# Patient Record
Sex: Male | Born: 1985 | Race: Black or African American | Hispanic: No | Marital: Single | Smoking: Former smoker
Health system: Southern US, Community
[De-identification: ages and names within clinical notes are randomized; demographics above are authoritative.]

## PROBLEM LIST (undated history)

## (undated) DIAGNOSIS — I82409 Acute embolism and thrombosis of unspecified deep veins of unspecified lower extremity: Secondary | ICD-10-CM

## (undated) DIAGNOSIS — E559 Vitamin D deficiency, unspecified: Secondary | ICD-10-CM

## (undated) DIAGNOSIS — E785 Hyperlipidemia, unspecified: Secondary | ICD-10-CM

## (undated) DIAGNOSIS — E039 Hypothyroidism, unspecified: Secondary | ICD-10-CM

## (undated) HISTORY — DX: Vitamin D deficiency, unspecified: E55.9

## (undated) HISTORY — DX: Hyperlipidemia, unspecified: E78.5

## (undated) HISTORY — DX: Acute embolism and thrombosis of unspecified deep veins of unspecified lower extremity: I82.409

## (undated) HISTORY — DX: Hypothyroidism, unspecified: E03.9

---
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
## 2011-11-14 ENCOUNTER — Emergency Department (HOSPITAL_COMMUNITY): Payer: BC Managed Care – PPO

## 2011-11-14 ENCOUNTER — Emergency Department (HOSPITAL_COMMUNITY)
Admission: EM | Admit: 2011-11-14 | Discharge: 2011-11-14 | Disposition: A | Discharge status: Home / Self Care | Payer: BC Managed Care – PPO | Attending: Emergency Medicine | Admitting: Emergency Medicine

## 2011-11-14 DIAGNOSIS — R51 Headache: Secondary | ICD-10-CM | POA: Insufficient documentation

## 2011-11-14 DIAGNOSIS — S0003XA Contusion of scalp, initial encounter: Secondary | ICD-10-CM | POA: Insufficient documentation

## 2011-11-14 DIAGNOSIS — M25569 Pain in unspecified knee: Secondary | ICD-10-CM | POA: Insufficient documentation

## 2011-11-14 DIAGNOSIS — M25519 Pain in unspecified shoulder: Secondary | ICD-10-CM | POA: Insufficient documentation

## 2011-11-14 DIAGNOSIS — S1093XA Contusion of unspecified part of neck, initial encounter: Secondary | ICD-10-CM | POA: Insufficient documentation

## 2011-11-14 DIAGNOSIS — IMO0002 Reserved for concepts with insufficient information to code with codable children: Secondary | ICD-10-CM | POA: Insufficient documentation

## 2011-11-14 DIAGNOSIS — R404 Transient alteration of awareness: Secondary | ICD-10-CM | POA: Insufficient documentation

## 2011-11-14 MED ORDER — IBUPROFEN 600 MG PO TABS: 600.0000 mg | ORAL_TABLET | Freq: Four times a day (QID) | ORAL | Status: AC | PRN

## 2011-11-14 MED ORDER — HYDROCODONE-ACETAMINOPHEN 5-325 MG PO TABS
1.0000 | ORAL_TABLET | Freq: Once | ORAL | Status: AC
  Administered 2011-11-14: 1 via ORAL
  Filled 2011-11-14: qty 1
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
## 2011-11-14 NOTE — ED Notes (Signed)
Patient transported to X-ray 
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
## 2011-11-15 NOTE — ED Provider Notes (Signed)
I saw and evaluated the patient, reviewed the resident's note and I agree with the findings and plan.  Elmo Shumard P Shterna Laramee, MD 11/15/11 1516 

## 2013-01-27 ENCOUNTER — Other Ambulatory Visit: Payer: Self-pay | Admitting: Internal Medicine

## 2013-01-27 DIAGNOSIS — R7989 Other specified abnormal findings of blood chemistry: Secondary | ICD-10-CM

## 2013-01-29 ENCOUNTER — Ambulatory Visit
Admission: RE | Admit: 2013-01-29 | Discharge: 2013-01-29 | Disposition: A | Discharge status: Home / Self Care | Payer: BC Managed Care – PPO | Source: Ambulatory Visit | Attending: Internal Medicine | Admitting: Internal Medicine

## 2013-01-29 DIAGNOSIS — R7989 Other specified abnormal findings of blood chemistry: Secondary | ICD-10-CM

## 2013-03-30 ENCOUNTER — Encounter (HOSPITAL_COMMUNITY): Payer: Self-pay | Admitting: Emergency Medicine

## 2013-03-30 ENCOUNTER — Emergency Department (HOSPITAL_COMMUNITY): Payer: BC Managed Care – PPO

## 2013-03-30 ENCOUNTER — Emergency Department (HOSPITAL_COMMUNITY)
Admission: EM | Admit: 2013-03-30 | Discharge: 2013-03-30 | Disposition: A | Discharge status: Home / Self Care | Payer: BC Managed Care – PPO | Attending: Emergency Medicine | Admitting: Emergency Medicine

## 2013-03-30 DIAGNOSIS — S01501A Unspecified open wound of lip, initial encounter: Secondary | ICD-10-CM | POA: Insufficient documentation

## 2013-03-30 DIAGNOSIS — Y9239 Other specified sports and athletic area as the place of occurrence of the external cause: Secondary | ICD-10-CM | POA: Insufficient documentation

## 2013-03-30 DIAGNOSIS — W219XXA Striking against or struck by unspecified sports equipment, initial encounter: Secondary | ICD-10-CM | POA: Insufficient documentation

## 2013-03-30 DIAGNOSIS — S01511A Laceration without foreign body of lip, initial encounter: Secondary | ICD-10-CM

## 2013-03-30 DIAGNOSIS — Y9367 Activity, basketball: Secondary | ICD-10-CM | POA: Insufficient documentation

## 2013-03-30 DIAGNOSIS — Y92838 Other recreation area as the place of occurrence of the external cause: Secondary | ICD-10-CM | POA: Insufficient documentation

## 2013-03-30 MED ORDER — PROMETHAZINE HCL 25 MG PO TABS
25.0000 mg | ORAL_TABLET | Freq: Four times a day (QID) | ORAL | Status: DC | PRN
Start: 2013-03-30 — End: 2013-12-31

## 2013-03-30 MED ORDER — IBUPROFEN 800 MG PO TABS
800.0000 mg | ORAL_TABLET | Freq: Once | ORAL | Status: AC
  Administered 2013-03-30: 800 mg via ORAL
  Filled 2013-03-30: qty 1

## 2013-03-30 MED ORDER — HYDROCODONE-ACETAMINOPHEN 5-325 MG PO TABS: 2.0000 | ORAL_TABLET | Freq: Four times a day (QID) | ORAL | Status: DC | PRN

## 2013-03-30 NOTE — ED Notes (Signed)
Pt ambulatory to exam room with steady gait. Bleeding controlled.

## 2013-03-30 NOTE — ED Notes (Signed)
Pt was playing basketball today and hit lip, small laceration to rt upper lip, bleeding controlled.

## 2013-03-30 NOTE — ED Provider Notes (Signed)
Medical screening examination/treatment/procedure(s) were performed by non-physician practitioner and as supervising physician I was immediately available for consultation/collaboration.  Toy Baker, MD 03/30/13 2220

## 2013-03-30 NOTE — ED Provider Notes (Signed)
History  This chart was scribed for non-physician practitioner Junious Silk, PA working with Toy Baker, MD by Quintella Reichert, ED Scribe. This patient was seen in room WTR8/WTR8 and the patient's care was started at 3:17 PM .   CSN: [id number]  Arrival date & time 03/30/13  1301    Chief Complaint  Patient presents with  . Lip Laceration    The history is provided by the patient. No language interpreter was used.     HPI Comments: David Tanner is a 27 y.o. male with no chronic medical conditions who presents to the Emergency Department complaining of a lip laceration that he sustained pta, with accompanying jaw pain.  Pt states that he was playing basketball when he accidentally jammed his chin on someone's shoulder and bit his right upper lip.  Bleeding to the laceration is controlled.  He states that his tetanus vaccinations are UTD.  Presently he also complains of constant, moderate pain to the bottom and sides of his jaw that radiates into the left ear.  Pain is described as sharp and is slightly exacerbated by opening his jaw and greatly exacerbated by biting down.  He has not taken pain medications pta.  Pt denies h/o prior dental problems or jaw injuries.  He notes that his last dental appointment was one month ago.     History reviewed. No pertinent past medical history.   History reviewed. No pertinent past surgical history.   No family history on file.   History  Substance Use Topics  . Smoking status: Not on file  . Smokeless tobacco: Not on file  . Alcohol Use: Not on file     Review of Systems  HENT:       Jaw pain  Skin: Positive for wound.  All other systems reviewed and are negative.      Allergies  Review of patient's allergies indicates no known allergies.   Home Medications  No current outpatient prescriptions on file.   There were no vitals taken for this visit.   Physical Exam  Nursing note and vitals  reviewed. Constitutional: He is oriented to person, place, and time. He appears well-developed and well-nourished. No distress.  HENT:  Head: Normocephalic.  Right Ear: External ear normal.  Left Ear: External ear normal.  Nose: Nose normal.  Mouth/Throat: Uvula is midline, oropharynx is clear and moist and mucous membranes are normal.  1-cm laceration on right upper lip without involvement of [place] border No tenderness over TMJ; no popping or abnormal movement identified.  Eyes: Conjunctivae are normal.  Neck: Normal range of motion. No tracheal deviation present.  Cardiovascular: Normal rate, regular rhythm and normal heart sounds.   Pulmonary/Chest: Effort normal and breath sounds normal. No stridor.  Abdominal: Soft. He exhibits no distension. There is no tenderness.  Musculoskeletal: Normal range of motion.  Neurological: He is alert and oriented to person, place, and time.  Skin: Skin is warm and dry. He is not diaphoretic.  Psychiatric: He has a normal mood and affect. His behavior is normal.    ED Course  Procedures (including critical care time)   COORDINATION OF CARE: 3:21 PM: Discussed treatment plan which includes laceration repair, pain medication and imaging.  Pt expressed understanding and agreed to plan.   LACERATION REPAIR PROCEDURE NOTE The patient's identification was confirmed and consent was obtained. This procedure was performed by Junious Silk, PA at 3:29 PM. Site: Right upper lip Sterile procedures observed Anesthetic used (type and amt):  1% lidocaine w/o epinephrine Suture type/size: 4-0 Vicryl Length: 1 cm # of Sutures: 1 Technique: Interrupted Complexity: Simple Tetanus UTD per patient Site anesthetized, irrigated with NS, explored without evidence of foreign body, wound well approximated, site covered with dry, sterile dressing.  Patient tolerated procedure well without complications. Instructions for care discussed verbally and patient  provided with additional written instructions for homecare and f/u.    Dg Orthopantogram  03/30/2013   *RADIOLOGY REPORT*  Clinical Data: Left upper dental pain  ORTHOPANTOGRAM/PANORAMIC  Comparison: None.  Findings: Filling noted in the right front tooth.  No fracture, bony erosion, or discrete.  focal lucencies are seen.  No gross acute tooth abnormalities appreciated on orthopantogram.  IMPRESSION: No acute bony abnormalities identified.   Original Report Authenticated By: Britta Mccreedy, M.D.     1. Lip laceration, initial encounter      MDM  Tdap booster given. Wound cleaning complete with pressure irrigation, bottom of wound visualized, no foreign bodies appreciated. Laceration occurred < 8 hours prior to repair which was well tolerated. Pt has no co morbidities to effect normal wound healing. Discussed suture home care w pt and answered questions. Patient also complaining of severe tooth pain when his tooth touches his other teeth. XR shows no tooth fracture. Pt is hemodynamically stable w no complaints prior to dc. Follow up with PCP.      I personally performed the services described in this documentation, which was scribed in my presence. The recorded information has been reviewed and is accurate.     Mora Bellman, PA-C 03/30/13 2109

## 2013-06-23 ENCOUNTER — Other Ambulatory Visit: Payer: Self-pay | Admitting: Occupational Medicine

## 2013-06-23 ENCOUNTER — Ambulatory Visit: Payer: Self-pay

## 2013-06-23 DIAGNOSIS — R52 Pain, unspecified: Secondary | ICD-10-CM

## 2013-12-30 ENCOUNTER — Encounter: Payer: Self-pay | Admitting: *Deleted

## 2013-12-30 DIAGNOSIS — E785 Hyperlipidemia, unspecified: Secondary | ICD-10-CM | POA: Insufficient documentation

## 2013-12-30 DIAGNOSIS — E559 Vitamin D deficiency, unspecified: Secondary | ICD-10-CM | POA: Insufficient documentation

## 2013-12-30 DIAGNOSIS — E039 Hypothyroidism, unspecified: Secondary | ICD-10-CM | POA: Insufficient documentation

## 2013-12-31 ENCOUNTER — Ambulatory Visit (INDEPENDENT_AMBULATORY_CARE_PROVIDER_SITE_OTHER): Payer: BC Managed Care – PPO | Admitting: Emergency Medicine

## 2013-12-31 ENCOUNTER — Encounter: Payer: Self-pay | Admitting: Emergency Medicine

## 2013-12-31 VITALS — BP 128/80 | HR 64 | Temp 98.2°F | Resp 18 | Ht 71.0 in | Wt 315.0 lb

## 2013-12-31 DIAGNOSIS — Z79899 Other long term (current) drug therapy: Secondary | ICD-10-CM

## 2013-12-31 DIAGNOSIS — E039 Hypothyroidism, unspecified: Secondary | ICD-10-CM

## 2013-12-31 DIAGNOSIS — E669 Obesity, unspecified: Secondary | ICD-10-CM

## 2013-12-31 DIAGNOSIS — E782 Mixed hyperlipidemia: Secondary | ICD-10-CM

## 2013-12-31 DIAGNOSIS — E559 Vitamin D deficiency, unspecified: Secondary | ICD-10-CM

## 2013-12-31 DIAGNOSIS — R7309 Other abnormal glucose: Secondary | ICD-10-CM

## 2013-12-31 DIAGNOSIS — L309 Dermatitis, unspecified: Secondary | ICD-10-CM

## 2013-12-31 LAB — CBC WITH DIFFERENTIAL/PLATELET
BASOS ABS: 0.1 10*3/uL (ref 0.0–0.1)
Basophils Relative: 1 % (ref 0–1)
EOS PCT: 4 % (ref 0–5)
Eosinophils Absolute: 0.2 10*3/uL (ref 0.0–0.7)
HCT: 41.3 % (ref 39.0–52.0)
Hemoglobin: 14.5 g/dL (ref 13.0–17.0)
LYMPHS ABS: 2.5 10*3/uL (ref 0.7–4.0)
LYMPHS PCT: 42 % (ref 12–46)
MCH: 28.3 pg (ref 26.0–34.0)
MCHC: 35.1 g/dL (ref 30.0–36.0)
MCV: 80.5 fL (ref 78.0–100.0)
Monocytes Absolute: 0.5 10*3/uL (ref 0.1–1.0)
Monocytes Relative: 9 % (ref 3–12)
NEUTROS ABS: 2.6 10*3/uL (ref 1.7–7.7)
NEUTROS PCT: 44 % (ref 43–77)
PLATELETS: 300 10*3/uL (ref 150–400)
RBC: 5.13 MIL/uL (ref 4.22–5.81)
RDW: 13.7 % (ref 11.5–15.5)
WBC: 5.9 10*3/uL (ref 4.0–10.5)

## 2013-12-31 LAB — HEMOGLOBIN A1C
HEMOGLOBIN A1C: 5.6 % (ref <5.7)
MEAN PLASMA GLUCOSE: 114 mg/dL (ref <117)

## 2013-12-31 MED ORDER — ATORVASTATIN CALCIUM 20 MG PO TABS: 20.0000 mg | ORAL_TABLET | Freq: Every day | ORAL | Status: DC

## 2013-12-31 MED ORDER — LEVOTHYROXINE SODIUM 75 MCG PO TABS: 75.0000 ug | ORAL_TABLET | Freq: Every day | ORAL | Status: DC

## 2013-12-31 NOTE — Progress Notes (Signed)
   Subjective:    Patient ID: David Tanner, male    DOB: 07/27/1986, 28 y.o.   MRN: [id number]  HPI Comments: 28 yo non-compliant obese male presents for 3 month F/U for Hypothyroid, Cholesterol, Pre-Dm, D. Deficient. LAST Labs T 223 152 L 152 TSH 20 D 26  HE has been off all medicine since October because he ran out and forgot to refill. He has been feeling OK but admits to being lazy. He has been off all RX x several months. He has not been exercising or eating healthy.   He has noticed increased dry skin and patches of roughness over his entire body. He notes they come and go. He denies any known triggers.    No current outpatient prescriptions on file prior to visit.   No current facility-administered medications on file prior to visit.   No Known Allergies Past Medical History  Diagnosis Date  . Hypothyroid   . Vitamin D deficiency   . Hyperlipidemia      Review of Systems  Skin: Positive for rash.  All other systems reviewed and are negative.   BP 128/80  Pulse 64  Temp(Src) 98.2 F (36.8 C) (Temporal)  Resp 18  Ht 5\' 11"  (1.803 m)  Wt 315 lb (142.883 kg)  BMI 43.95 kg/m2     Objective:   Physical Exam  Nursing note and vitals reviewed. Constitutional: He is oriented to person, place, and time. He appears well-developed and well-nourished.  Obese  HENT:  Head: Normocephalic and atraumatic.  Right Ear: External ear normal.  Left Ear: External ear normal.  Nose: Nose normal.  Eyes: Conjunctivae and EOM are normal.  Neck: Normal range of motion. Neck supple. No JVD present. No thyromegaly present.  Cardiovascular: Normal rate, regular rhythm, normal heart sounds and intact distal pulses.   Pulmonary/Chest: Effort normal and breath sounds normal.  Abdominal: Soft. Bowel sounds are normal. He exhibits no distension and no mass. There is no tenderness. There is no rebound and no guarding.  Musculoskeletal: Normal range of motion. He exhibits no edema and no  tenderness.  Lymphadenopathy:    He has no cervical adenopathy.  Neurological: He is alert and oriented to person, place, and time. He has normal reflexes. No cranial nerve deficit. Coordination normal.  Skin: Skin is warm and dry.  Sandpaper type rash on arms and torso  Psychiatric: He has a normal mood and affect. His behavior is normal. Judgment and thought content normal.          Assessment & Plan:  1.  3 month F/U for obesity, Cholesterol, Pre-Dm, D, hypothyroid. Deficient. Needs healthy diet, cardio QD and obtain healthy weight. Check Labs, Check BP if >130/80 call office. Restart RX AD, long discussion about compliance and risks  2. Eczema- Hygiene explained, ADD zyrtec, lotion QD aveeno. If no change can refer to derm.

## 2013-12-31 NOTE — Patient Instructions (Signed)
We want weight loss that will last so you should lose 1-2 pounds a week.  THAT IS IT! Please pick THREE things a month to change. Once it is a habit check off the item. Then pick another three items off the list to become habits.  If you are already doing a habit on the list GREAT!  Cross that item off! o Don't drink your calories. Ie, alcohol, soda, fruit juice, and sweet tea.  o Drink more water. Drink a glass when you feel hungry or before each meal.  o Eat breakfast - Complex carb and protein (likeDannon light and fit yogurt, oatmeal, fruit, eggs, [place] bacon). o Measure your cereal.  Eat no more than one cup a day. (ie [place]) o Eat an apple a day. o Add a vegetable a day. o Try a new vegetable a month. o Use Pam! Stop using oil or butter to cook. o Don't finish your plate or use smaller plates. o Share your dessert. o Eat sugar free Jello for dessert or frozen grapes. o Don't eat 2-3 hours before bed. o Switch to whole wheat bread, pasta, and brown rice. o Make healthier choices when you eat out. No fries! o Pick baked chicken, NOT fried. o Don't forget to SLOW DOWN when you eat. It is not going anywhere.  o Take the stairs. o Park far away in the parking lot o [employer] (or weights) for 10 minutes while watching TV. o Walk at work for 10 minutes during break. o Walk outside 1 time a week with your friend, kids, dog, or significant other. o Start a walking group at church. o Walk the mall as much as you can tolerate.  o Keep a food diary. o Weigh yourself daily. o Walk for 15 minutes 3 days per week. o Cook at home more often and eat out less.  If life happens and you go back to old habits, it is okay.  Just start over. You can do it!   If you experience chest pain, get short of breath, or tired during the exercise, please stop immediately and inform your doctor.  Cholesterol Cholesterol is a type of fat. Your body needs a small amount of cholesterol, but too much can cause  health problems. Certain problems include heart attacks, strokes, and not enough blood flow to your heart, brain, kidneys, or feet. You get cholesterol in 2 ways:  Naturally.  By eating certain foods. HOME CARE  Eat a low-fat diet:  Eat less eggs, whole dairy products (whole milk, cheese, and butter), fatty meats, and fried foods.  Eat more fruits, vegetables, whole-wheat breads, lean chicken, and fish.  Follow your exercise program as told by your doctor.  Keep your weight at a healthy level. Talk to your doctor about what is right for you.  Only take medicine as told by your doctor.  Get your cholesterol checked once a year or as told by your doctor. MAKE SURE YOU:  Understand these instructions.  Will watch your condition.  Will get help right away if you are not doing well or get worse. Document Released: 12/13/2008 Document Revised: 12/09/2011 Document Reviewed: 06/30/2013 [facility] Patient Information 2014 [place], [place]. Eczema Eczema, also called atopic dermatitis, is a skin disorder that causes inflammation of the skin. It causes a red rash and dry, scaly skin. The skin becomes very itchy. Eczema is generally worse during the cooler winter months and often improves with the warmth of summer. Eczema usually starts showing  signs in infancy. Some children outgrow eczema, but it may last through adulthood.  CAUSES  The exact cause of eczema is not known, but it appears to run in families. People with eczema often have a family history of eczema, allergies, asthma, or hay fever. Eczema is not contagious. Flare-ups of the condition may be caused by:   Contact with something you are sensitive or allergic to.   Stress. SIGNS AND SYMPTOMS  Dry, scaly skin.   Red, itchy rash.   Itchiness. This may occur before the skin rash and may be very intense.  DIAGNOSIS  The diagnosis of eczema is usually made based on symptoms and medical history. TREATMENT  Eczema cannot be  cured, but symptoms usually can be controlled with treatment and other strategies. A treatment plan might include:  Controlling the itching and scratching.   Use over-the-counter antihistamines as directed for itching. This is especially useful at night when the itching tends to be worse.   Use over-the-counter steroid creams as directed for itching.   Avoid scratching. Scratching makes the rash and itching worse. It may also result in a skin infection (impetigo) due to a break in the skin caused by scratching.   Keeping the skin well moisturized with creams every day. This will seal in moisture and help prevent dryness. Lotions that contain alcohol and water should be avoided because they can dry the skin.   Limiting exposure to things that you are sensitive or allergic to (allergens).   Recognizing situations that cause stress.   Developing a plan to manage stress.  HOME CARE INSTRUCTIONS   Only take over-the-counter or prescription medicines as directed by your health care provider.   Do not use anything on the skin without checking with your health care provider.   Keep baths or showers short (5 minutes) in warm (not hot) water. Use mild cleansers for bathing. These should be unscented. You may add nonperfumed bath oil to the bath water. It is best to avoid soap and bubble bath.   Immediately after a bath or shower, when the skin is still damp, apply a moisturizing ointment to the entire body. This ointment should be a petroleum ointment. This will seal in moisture and help prevent dryness. The thicker the ointment, the better. These should be unscented.   Keep fingernails cut short. Children with eczema may need to wear soft gloves or mittens at night after applying an ointment.   Dress in clothes made of cotton or cotton blends. Dress lightly, because heat increases itching.   A child with eczema should stay away from anyone with fever blisters or cold sores. The  virus that causes fever blisters (herpes simplex) can cause a serious skin infection in children with eczema. SEEK MEDICAL CARE IF:   Your itching interferes with sleep.   Your rash gets worse or is not better within 1 week after starting treatment.   You see pus or soft yellow scabs in the rash area.   You have a fever.   You have a rash flare-up after contact with someone who has fever blisters.  Document Released: 09/13/2000 Document Revised: 07/07/2013 Document Reviewed: 04/19/2013 [facility] Patient Information 2014 [place], [place]. Obesity Obesity is having too much body fat and a body mass index (BMI) of 30 or more. BMI is a number based on your height and weight. The number is an estimate of how much body fat you have. Obesity can happen if you eat more calories than you can burn  by exercising or other activity. It can cause major health problems or emergencies.  HOME CARE  Exercise and be active as told by your doctor. Try:  Using stairs when you can.  Parking farther away from store doors.  Gardening, biking, or walking.  Eat healthy foods and drinks that are low in calories. Eat more fruits and vegetables.  Limit fast food, sweets, and snack foods that are made with ingredients that are not natural (processed food).  Eat smaller amounts of food.  Keep a journal and write down what you eat every day. Websites can help with this.  Avoid drinking alcohol. Drink more water and drinks without calories.   Take vitamins and dietary pills (supplements) only as told by your doctor.  Try going to weight-loss support groups or classes to help lessen stress. Dieticians and counselors may also help. GET HELP RIGHT AWAY IF:  You have chest pain or tightness.  You have trouble breathing or feel short of breath.  You feel weak or have loss of feeling (numbness) in your legs.  You feel confused or have trouble talking.  You have sudden changes in your vision. MAKE SURE  YOU:  Understand these instructions.  Will watch your condition.  Will get help right away if you are not doing well or get worse. Document Released: 12/09/2011 Document Reviewed: 12/09/2011 [facility] Patient Information 2014 [place], [place]. Hypothyroidism The thyroid is a large gland located in the lower front of your neck. The thyroid gland helps control metabolism. Metabolism is how your body handles food. It controls metabolism with the hormone thyroxine. When this gland is underactive (hypothyroid), it produces too little hormone.  CAUSES These include:   Absence or destruction of thyroid tissue.  Goiter due to iodine deficiency.  Goiter due to medications.  Congenital defects (since birth).  Problems with the pituitary. This causes a lack of TSH (thyroid stimulating hormone). This hormone tells the thyroid to turn out more hormone. SYMPTOMS  Lethargy (feeling as though you have no energy)  Cold intolerance  Weight gain (in spite of normal food intake)  Dry skin  Coarse hair  Menstrual irregularity (if severe, may lead to infertility)  Slowing of thought processes Cardiac problems are also caused by insufficient amounts of thyroid hormone. Hypothyroidism in the newborn is cretinism, and is an extreme form. It is important that this form be treated adequately and immediately or it will lead rapidly to retarded physical and mental development. DIAGNOSIS  To prove hypothyroidism, your caregiver may do blood tests and ultrasound tests. Sometimes the signs are hidden. It may be necessary for your caregiver to watch this illness with blood tests either before or after diagnosis and treatment. TREATMENT  Low levels of thyroid hormone are increased by using synthetic thyroid hormone. This is a safe, effective treatment. It usually takes about four weeks to gain the full effects of the medication. After you have the full effect of the medication, it will generally take another  four weeks for problems to leave. Your caregiver may start you on low doses. If you have had heart problems the dose may be gradually increased. It is generally not an emergency to get rapidly to normal. HOME CARE INSTRUCTIONS   Take your medications as your caregiver suggests. Let your caregiver know of any medications you are taking or start taking. Your caregiver will help you with dosage schedules.  As your condition improves, your dosage needs may increase. It will be necessary to have continuing blood tests  as suggested by your caregiver.  Report all suspected medication side effects to your caregiver. SEEK MEDICAL CARE IF: Seek medical care if you develop:  Sweating.  Tremulousness (tremors).  Anxiety.  Rapid weight loss.  Heat intolerance.  Emotional swings.  Diarrhea.  Weakness. SEEK IMMEDIATE MEDICAL CARE IF:  You develop chest pain, an irregular heart beat (palpitations), or a rapid heart beat. MAKE SURE YOU:   Understand these instructions.  Will watch your condition.  Will get help right away if you are not doing well or get worse. Document Released: 09/16/2005 Document Revised: 12/09/2011 Document Reviewed: 05/06/2008 [facility] Patient Information 2014 [place], [place].

## 2014-01-01 LAB — LIPID PANEL
CHOL/HDL RATIO: 5 ratio
Cholesterol: 207 mg/dL — ABNORMAL HIGH (ref 0–200)
HDL: 41 mg/dL (ref >39)
LDL Cholesterol: 127 mg/dL — ABNORMAL HIGH (ref 0–99)
Triglycerides: 194 mg/dL — ABNORMAL HIGH (ref <150)
VLDL: 39 mg/dL (ref 0–40)

## 2014-01-01 LAB — HEPATIC FUNCTION PANEL
ALK PHOS: 46 U/L (ref 39–117)
ALT: 23 U/L (ref 0–53)
AST: 21 U/L (ref 0–37)
Albumin: 4.3 g/dL (ref 3.5–5.2)
BILIRUBIN DIRECT: 0.1 mg/dL (ref 0.0–0.3)
BILIRUBIN TOTAL: 0.3 mg/dL (ref 0.2–1.2)
Indirect Bilirubin: 0.2 mg/dL (ref 0.2–1.2)
Total Protein: 6.5 g/dL (ref 6.0–8.3)

## 2014-01-01 LAB — MAGNESIUM: MAGNESIUM: 1.8 mg/dL (ref 1.5–2.5)

## 2014-01-01 LAB — BASIC METABOLIC PANEL WITH GFR
BUN: 13 mg/dL (ref 6–23)
CHLORIDE: 104 meq/L (ref 96–112)
CO2: 23 mEq/L (ref 19–32)
Calcium: 9.6 mg/dL (ref 8.4–10.5)
Creat: 1.29 mg/dL (ref 0.50–1.35)
GFR, EST AFRICAN AMERICAN: 88 mL/min
GFR, EST NON AFRICAN AMERICAN: 77 mL/min
Glucose, Bld: 99 mg/dL (ref 70–99)
POTASSIUM: 4.3 meq/L (ref 3.5–5.3)
SODIUM: 139 meq/L (ref 135–145)

## 2014-01-01 LAB — VITAMIN D 25 HYDROXY (VIT D DEFICIENCY, FRACTURES): Vit D, 25-Hydroxy: 17 ng/mL — ABNORMAL LOW (ref 30–89)

## 2014-01-01 LAB — TSH: TSH: 15.281 u[IU]/mL — AB (ref 0.350–4.500)

## 2014-01-01 LAB — INSULIN, FASTING: Insulin fasting, serum: 24 u[IU]/mL (ref 3–28)

## 2014-01-04 ENCOUNTER — Other Ambulatory Visit: Payer: Self-pay | Admitting: Emergency Medicine

## 2014-01-04 ENCOUNTER — Telehealth: Payer: Self-pay

## 2014-01-04 MED ORDER — ATORVASTATIN CALCIUM 20 MG PO TABS: 20.0000 mg | ORAL_TABLET | Freq: Every day | ORAL | Status: DC

## 2014-01-04 NOTE — Telephone Encounter (Signed)
Pt called and can't get Rx for cholesterol @ [facility]. Can you send Rx to [facility]?

## 2014-01-04 NOTE — Telephone Encounter (Signed)
Advised [facility] to [facility]

## 2014-02-03 ENCOUNTER — Other Ambulatory Visit: Payer: BC Managed Care – PPO

## 2014-02-03 DIAGNOSIS — E039 Hypothyroidism, unspecified: Secondary | ICD-10-CM

## 2014-02-04 LAB — TSH: TSH: 5.606 u[IU]/mL — ABNORMAL HIGH (ref 0.350–4.500)

## 2014-02-22 ENCOUNTER — Other Ambulatory Visit: Payer: Self-pay | Admitting: *Deleted

## 2014-02-22 MED ORDER — ATORVASTATIN CALCIUM 20 MG PO TABS: 20.0000 mg | ORAL_TABLET | Freq: Every day | ORAL | Status: DC

## 2014-03-09 ENCOUNTER — Ambulatory Visit (INDEPENDENT_AMBULATORY_CARE_PROVIDER_SITE_OTHER): Payer: BC Managed Care – PPO | Admitting: Physician Assistant

## 2014-03-09 ENCOUNTER — Encounter: Payer: Self-pay | Admitting: Physician Assistant

## 2014-03-09 VITALS — BP 130/82 | HR 68 | Temp 97.9°F | Resp 16 | Ht 71.0 in | Wt 311.0 lb

## 2014-03-09 DIAGNOSIS — E785 Hyperlipidemia, unspecified: Secondary | ICD-10-CM

## 2014-03-09 DIAGNOSIS — B360 Pityriasis versicolor: Secondary | ICD-10-CM

## 2014-03-09 DIAGNOSIS — Z0000 Encounter for general adult medical examination without abnormal findings: Secondary | ICD-10-CM

## 2014-03-09 LAB — URINALYSIS, ROUTINE W REFLEX MICROSCOPIC
Bilirubin Urine: NEGATIVE
Glucose, UA: NEGATIVE mg/dL
HGB URINE DIPSTICK: NEGATIVE
Ketones, ur: NEGATIVE mg/dL
LEUKOCYTES UA: NEGATIVE
NITRITE: NEGATIVE
PROTEIN: NEGATIVE mg/dL
Specific Gravity, Urine: 1.017 (ref 1.005–1.030)
Urobilinogen, UA: 0.2 mg/dL (ref 0.0–1.0)
pH: 6 (ref 5.0–8.0)

## 2014-03-09 LAB — CBC WITH DIFFERENTIAL/PLATELET
BASOS PCT: 1 % (ref 0–1)
Basophils Absolute: 0.1 10*3/uL (ref 0.0–0.1)
EOS ABS: 0.3 10*3/uL (ref 0.0–0.7)
EOS PCT: 5 % (ref 0–5)
HCT: 42.4 % (ref 39.0–52.0)
Hemoglobin: 14.8 g/dL (ref 13.0–17.0)
LYMPHS ABS: 2.1 10*3/uL (ref 0.7–4.0)
Lymphocytes Relative: 39 % (ref 12–46)
MCH: 28.6 pg (ref 26.0–34.0)
MCHC: 34.9 g/dL (ref 30.0–36.0)
MCV: 81.9 fL (ref 78.0–100.0)
MONOS PCT: 10 % (ref 3–12)
Monocytes Absolute: 0.5 10*3/uL (ref 0.1–1.0)
Neutro Abs: 2.4 10*3/uL (ref 1.7–7.7)
Neutrophils Relative %: 45 % (ref 43–77)
PLATELETS: 287 10*3/uL (ref 150–400)
RBC: 5.18 MIL/uL (ref 4.22–5.81)
RDW: 14.4 % (ref 11.5–15.5)
WBC: 5.3 10*3/uL (ref 4.0–10.5)

## 2014-03-09 LAB — HEMOGLOBIN A1C
HEMOGLOBIN A1C: 5.8 % — AB (ref <5.7)
MEAN PLASMA GLUCOSE: 120 mg/dL — AB (ref <117)

## 2014-03-09 MED ORDER — CICLOPIROX 1 % EX SHAM: MEDICATED_SHAMPOO | CUTANEOUS | Status: DC

## 2014-03-09 NOTE — Progress Notes (Signed)
Complete Physical  Assessment and Plan: Hypothyroid-Hypothyroidism-check TSH level, continue medications the same.   Vitamin D deficiency-check level  Hyperlipidemia--continue medications, check lipids, decrease fatty foods, increase activity.   Obesity BMI over 40-Obesity with co morbidities- long discussion about weight loss, diet, and exercise Tinea Versicolor- shampoo given, if not better will send back to Dr. Jorja Loa.  Health Maintenance   Discussed med's effects and SE's. Screening labs and tests as requested with regular follow-up as recommended.  HPI 28 y.o. male  presents for a complete physical. His blood pressure has been controlled at home, today their BP is BP: 130/82 mmHg He does not workout but works hard at work at [employer]. He denies chest pain, shortness of breath, dizziness.  He is on cholesterol medication and denies myalgias. His cholesterol is at goal. The cholesterol last visit was:   Lab Results  Component Value Date   CHOL 207* 12/31/2013   HDL 41 12/31/2013   LDLCALC 588* 12/31/2013   TRIG 194* 12/31/2013   CHOLHDL 5.0 12/31/2013   He has been working on diet and exercise for prediabetes, and denies paresthesia of the feet, polydipsia and polyuria. Last A1C in the office was:  Lab Results  Component Value Date   HGBA1C 5.6 12/31/2013   Patient is on Vitamin D supplement. He is on thyroid medication. His medication was changed last visit, he is on 1.5 T/T and 1 pill the rest of the day. Patient denies nervousness, palpitations and weight changes.  Lab Results  Component Value Date   TSH 5.606* 02/03/2014  .  Wt Readings from Last 3 Encounters:  03/09/14 311 lb (141.069 kg)  12/31/13 315 lb (142.883 kg)   He complains of a rash for the past 3-4 years worse with sun, sweating, and the summer. It did get better but now has spread. It is irritated with sweating.   Current Medications:  Current Outpatient Prescriptions on File Prior to Visit  Medication Sig Dispense  Refill  . atorvastatin (LIPITOR) 20 MG tablet Take 1 tablet (20 mg total) by mouth daily.  90 tablet  0  . levothyroxine (SYNTHROID) 75 MCG tablet Take 1 tablet (75 mcg total) by mouth daily.  30 tablet  3   No current facility-administered medications on file prior to visit.   Health Maintenance:  Tetanus: 2007 Pneumovax:N/A Flu vaccine: N/A Zostavax: N/A DEXA: N/A Colonoscopy: N/A EGD: N/A  Allergies: No Known Allergies Medical History:  Past Medical History  Diagnosis Date  . Hypothyroid   . Vitamin D deficiency   . Hyperlipidemia    Surgical History: No past surgical history on file. Family History: No family history on file. Social History:   History  Substance Use Topics  . Smoking status: Current Some Day Smoker  . Smokeless tobacco: Not on file  . Alcohol Use: Not on file   ROS:  [X]  = complains of  [ ]  = denies  General: Fatigue [ ]  Fever [ ]  Chills [ ]  Weakness [ ]   Insomnia [ ]  Weight change [ ]  Night sweats [ ]   Change in appetite [ ]  Eyes: Redness [ ]  Blurred vision [ ]  Diplopia [ ]  Discharge [ ]   ENT: Congestion [ ]  Sinus Pain [ ]  Post Nasal Drip [ ]  Sore Throat [ ]  Earache [ ]  hearing loss [ ]  Tinnitus [ ]  Snoring [ ]   Cardiac: Chest pain/pressure [ ]  SOB [ ]  Orthopnea [ ]   Palpitations [ ]   Paroxysmal nocturnal dyspnea[ ]  Claudication [ ]   Edema [ ]   Pulmonary: Cough [ ]  Wheezing[ ]   SOB [ ]   Pleurisy [ ]   GI: Nausea [ ]  Vomiting[ ]  Dysphagia[ ]  Heartburn[ ]  Abdominal pain [ ]  Constipation [ ] ; Diarrhea [ ]  BRBPR [ ]  Melena[ ]  Bloating [ ]  Hemorrhoids [ ]   GU: Hematuria[ ]  Dysuria [ ]  Nocturia[ ]  Urgency [ ]   Hesitancy [ ]  Discharge [ ]  Frequency [ ]   Neuro: Headaches[ ]  Vertigo[ ]  Paresthesias[ ]  Spasm [ ]  Speech changes [ ]  Incoordination [ ]   Ortho: Arthritis [ ]  Joint pain right hip pain Arly.Keller[X ] Muscle pain [ ]  Joint swelling [ ]  Back Pain [ ]  Skin:  Rash [x ]  Pruritis [x ] Change in skin lesion [ ]   Psych: Depression[ ]  Anxiety[ ]  Confusion [ ]  Memory  loss [ ]   Heme/Lypmh: Bleeding [ ]  Bruising [ ]  Enlarged lymph nodes [ ]   Endocrine: Visual blurring [ ]  Paresthesia [ ]  Polyuria [ ]  Polydypsea [ ]    Heat/cold intolerance [ ]  Hypoglycemia [ ]   Physical Exam: Estimated body mass index is 43.4 kg/(m^2) as calculated from the following:   Height as of this encounter: 5\' 11"  (1.803 m).   Weight as of this encounter: 311 lb (141.069 kg). BP 130/82  Pulse 68  Temp(Src) 97.9 F (36.6 C)  Resp 16  Ht 5\' 11"  (1.803 m)  Wt 311 lb (141.069 kg)  BMI 43.40 kg/m2 General Appearance: Well nourished, in no apparent distress. Eyes: PERRLA, EOMs, conjunctiva no swelling or erythema, normal fundi and vessels. Sinuses: No Frontal/maxillary tenderness ENT/Mouth: Ext aud canals clear, normal light reflex with TMs without erythema, bulging. Good dentition. No erythema, swelling, or exudate on post pharynx. Tonsils not swollen or erythematous. Hearing normal.  Neck: Supple, thyroid normal. No bruits Respiratory: Respiratory effort normal, BS equal bilaterally without rales, rhonchi, wheezing or stridor. Cardio: RRR without murmurs, rubs or gallops. Brisk peripheral pulses without edema.  Chest: symmetric, with normal excursions and percussion. Abdomen: Soft, +BS. Non tender, no guarding, rebound, hernias, masses, or organomegaly. .  Lymphatics: Non tender without lymphadenopathy.  Genitourinary: defer Musculoskeletal: Full ROM all peripheral extremities,5/5 strength, and normal gait. Skin: Warm, dry without rashes, lesions, ecchymosis. Velvety stuck on non erythematous patches/plaques lighter in color than skin along bilateral arms, trunk, and neck.  Neuro: Cranial nerves intact, reflexes equal bilaterally. Normal muscle tone, no cerebellar symptoms. Sensation intact.  Psych: Awake and oriented X 3, normal affect, Insight and Judgment appropriate.   Quentin Mullingollier, Artavis Cowie 10:24 AM

## 2014-03-09 NOTE — Patient Instructions (Addendum)
Tinea Versicolor Tinea versicolor is a common yeast infection of the skin. This condition becomes known when the yeast on our skin starts to overgrow (yeast is a normal inhabitant on our skin). This condition is noticed as white or light brown patches on brown skin, and is more evident in the summer on tanned skin. These areas are slightly scaly if scratched. The light patches from the yeast become evident when the yeast creates "holes in your suntan". This is most often noticed in the summer. The patches are usually located on the chest, back, pubis, neck and body folds. However, it may occur on any area of body. Mild itching and inflammation (redness or soreness) may be present. DIAGNOSIS  The diagnosisof this is made clinically (by looking). Cultures from samples are usually not needed. Examination under the microscope may help. However, yeast is normally found on skin. The diagnosis still remains clinical. Examination under Wood's Ultraviolet Light can determine the extent of the infection. TREATMENT  This common infection is usually only of cosmetic (only a concern to your appearance). It is easily treated with dandruff shampoo used during showers or bathing. Vigorous scrubbing will eliminate the yeast over several days time. The light areas in your skin may remain for weeks or months after the infection is cured unless your skin is exposed to sunlight. The lighter or darker spots caused by the fungus that remain after complete treatment are not a sign of treatment failure; it will take a long time to resolve. Your caregiver may recommend a number of commercial preparations or medication by mouth if home care is not working. Recurrence is common and preventative medication may be necessary. This skin condition is not highly contagious. Special care is not needed to protect close friends and family members. Normal hygiene is usually enough. Follow up is required only if you develop complications (such as a  secondary infection from scratching), if recommended by your caregiver, or if no relief is obtained from the preparations used. Document Released: 09/13/2000 Document Revised: 12/09/2011 Document Reviewed: 10/26/2008 [facility] Patient Information 2014 [employer], [place]. Ciclopirox shampoo What is this medicine? CICLOPIROX SHAMPOO (sye kloe PEER ox) is an antifungal medicine. It used to treat seborrhea, a dandruff-like condition of the scalp and hair. This medicine may be used for other purposes; ask your health care provider or pharmacist if you have questions. COMMON BRAND NAME(S): Loprox What should I tell my health care provider before I take this medicine? They need to know if you have any of these conditions: -diabetes mellitus -herpes infection -HIV infection -constant fungal infections of the skin -an unusual or allergic reaction to ciclopirox, other medicines, foods, dyes, or preservatives -pregnant or trying to get pregnant -breast-feeding How should I use this medicine? This medicine is for use on the hair and scalp only. Follow the directions on the prescription label. Wet hair and apply the amount prescribed to the scalp. Lather and leave on the hair and scalp for 3 minutes, then rinse. You will repeat the treatment twice per week (with 3 days between treatments) for 4 weeks. Use the shampoo for the full treatment time even though your symptoms may have improved. Do not get this shampoo in your eyes. If you do, rinse out with plenty of cool tap water.Do not use your medicine more often than directed. Do not use this medicine for any condition other than the one for which it was prescribed. Talk to your pediatrician regarding the use of this medicine in children. While  this drug may be prescribed for children as young as 69 years of age for selected conditions, precautions do apply. Overdosage: If you think you have taken too much of this medicine contact a poison control center or  emergency room at once. NOTE: This medicine is only for you. Do not share this medicine with others. What if I miss a dose? If you miss a dose, use it as soon as you can. If it is almost time for your next dose, use only that dose. Do not use double or extra doses. What may interact with this medicine? Interactions are not expected. Do not use any other skin products without telling your doctor or health care professional. This list may not describe all possible interactions. Give your health care provider a list of all the medicines, herbs, non-prescription drugs, or dietary supplements you use. Also tell them if you smoke, drink alcohol, or use illegal drugs. Some items may interact with your medicine. What should I watch for while using this medicine? Tell your doctor or health care professional if your symptoms do not improve within 4 weeks, or if they get worse. It is important not to use more shampoo than prescribed. If you have a reaction to the shampoo that suggests you are sensitive to it, discontinue use and contact your doctor or health care professional. What side effects may I notice from receiving this medicine? Side effects that you should report to your doctor or health care professional as soon as possible: -allergic reactions like skin rash, itching or hives, swelling of the face, lips, or tongue -redness, severe burning, blistering, or oozing of the skin Side effects that usually do not require medical attention (report to your doctor or health care professional if they continue or are bothersome): -mild itching This list may not describe all possible side effects. Call your doctor for medical advice about side effects. You may report side effects to FDA at 1-800-FDA-1088. Where should I keep my medicine? Keep out of the reach of children. Store at room temperature between 15 and 30 degrees C (59 and 86 degrees F). Do not freeze. Once opened, the shampoo may be used for up to 8  weeks. Throw away any unused medicine after the expiration date. NOTE: This sheet is a summary. It may not cover all possible information. If you have questions about this medicine, talk to your doctor, pharmacist, or health care provider.  2014, [employer]vier/Gold Standard. (2013-06-11 17:07:05)  Hypothyroidism The thyroid is a large gland located in the lower front of your neck. The thyroid gland helps control metabolism. Metabolism is how your body handles food. It controls metabolism with the hormone thyroxine. When this gland is underactive (hypothyroid), it produces too little hormone.  CAUSES These include:   Absence or destruction of thyroid tissue.  Goiter due to iodine deficiency.  Goiter due to medications.  Congenital defects (since birth).  Problems with the pituitary. This causes a lack of TSH (thyroid stimulating hormone). This hormone tells the thyroid to turn out more hormone. SYMPTOMS  Lethargy (feeling as though you have no energy)  Cold intolerance  Weight gain (in spite of normal food intake)  Dry skin  Coarse hair  Menstrual irregularity (if severe, may lead to infertility)  Slowing of thought processes Cardiac problems are also caused by insufficient amounts of thyroid hormone. Hypothyroidism in the newborn is cretinism, and is an extreme form. It is important that this form be treated adequately and immediately or it will lead  rapidly to retarded physical and mental development. DIAGNOSIS  To prove hypothyroidism, your caregiver may do blood tests and ultrasound tests. Sometimes the signs are hidden. It may be necessary for your caregiver to watch this illness with blood tests either before or after diagnosis and treatment. TREATMENT  Low levels of thyroid hormone are increased by using synthetic thyroid hormone. This is a safe, effective treatment. It usually takes about four weeks to gain the full effects of the medication. After you have the full effect of  the medication, it will generally take another four weeks for problems to leave. Your caregiver may start you on low doses. If you have had heart problems the dose may be gradually increased. It is generally not an emergency to get rapidly to normal. HOME CARE INSTRUCTIONS   Take your medications as your caregiver suggests. Let your caregiver know of any medications you are taking or start taking. Your caregiver will help you with dosage schedules.  As your condition improves, your dosage needs may increase. It will be necessary to have continuing blood tests as suggested by your caregiver.  Report all suspected medication side effects to your caregiver. SEEK MEDICAL CARE IF: Seek medical care if you develop:  Sweating.  Tremulousness (tremors).  Anxiety.  Rapid weight loss.  Heat intolerance.  Emotional swings.  Diarrhea.  Weakness. SEEK IMMEDIATE MEDICAL CARE IF:  You develop chest pain, an irregular heart beat (palpitations), or a rapid heart beat. MAKE SURE YOU:   Understand these instructions.  Will watch your condition.  Will get help right away if you are not doing well or get worse. Document Released: 09/16/2005 Document Revised: 12/09/2011 Document Reviewed: 05/06/2008 [facility] Patient Information 2014 [place], [place].

## 2014-03-10 LAB — INSULIN, FASTING: INSULIN FASTING, SERUM: 42 u[IU]/mL — AB (ref 3–28)

## 2014-03-10 LAB — TSH: TSH: 5.246 u[IU]/mL — ABNORMAL HIGH (ref 0.350–4.500)

## 2014-03-10 LAB — BASIC METABOLIC PANEL WITH GFR
BUN: 10 mg/dL (ref 6–23)
CALCIUM: 9.2 mg/dL (ref 8.4–10.5)
CO2: 23 meq/L (ref 19–32)
CREATININE: 1.22 mg/dL (ref 0.50–1.35)
Chloride: 105 mEq/L (ref 96–112)
GFR, Est African American: >89 mL/min
GFR, Est Non African American: 81 mL/min
GLUCOSE: 90 mg/dL (ref 70–99)
Potassium: 4.4 mEq/L (ref 3.5–5.3)
Sodium: 140 mEq/L (ref 135–145)

## 2014-03-10 LAB — HEPATIC FUNCTION PANEL
ALBUMIN: 4.3 g/dL (ref 3.5–5.2)
ALT: 30 U/L (ref 0–53)
AST: 26 U/L (ref 0–37)
Alkaline Phosphatase: 48 U/L (ref 39–117)
Bilirubin, Direct: 0.1 mg/dL (ref 0.0–0.3)
Indirect Bilirubin: 0.3 mg/dL (ref 0.2–1.2)
Total Bilirubin: 0.4 mg/dL (ref 0.2–1.2)
Total Protein: 6.4 g/dL (ref 6.0–8.3)

## 2014-03-10 LAB — IRON AND TIBC
%SAT: 23 % (ref 20–55)
Iron: 70 ug/dL (ref 42–165)
TIBC: 310 ug/dL (ref 215–435)
UIBC: 240 ug/dL (ref 125–400)

## 2014-03-10 LAB — LIPID PANEL
CHOLESTEROL: 182 mg/dL (ref 0–200)
HDL: 41 mg/dL (ref >39)
LDL Cholesterol: 114 mg/dL — ABNORMAL HIGH (ref 0–99)
Total CHOL/HDL Ratio: 4.4 Ratio
Triglycerides: 136 mg/dL (ref <150)
VLDL: 27 mg/dL (ref 0–40)

## 2014-03-10 LAB — MAGNESIUM: Magnesium: 1.8 mg/dL (ref 1.5–2.5)

## 2014-03-10 LAB — TESTOSTERONE: TESTOSTERONE: 208 ng/dL — AB (ref 300–890)

## 2014-03-10 LAB — VITAMIN B12: VITAMIN B 12: 251 pg/mL (ref 211–911)

## 2014-03-10 LAB — VITAMIN D 25 HYDROXY (VIT D DEFICIENCY, FRACTURES): VIT D 25 HYDROXY: 23 ng/mL — AB (ref 30–89)

## 2014-03-10 LAB — MICROALBUMIN / CREATININE URINE RATIO
Creatinine, Urine: 261.9 mg/dL
Microalb Creat Ratio: 4.5 mg/g (ref 0.0–30.0)
Microalb, Ur: 1.19 mg/dL (ref 0.00–1.89)

## 2014-04-13 ENCOUNTER — Other Ambulatory Visit: Payer: Self-pay | Admitting: *Deleted

## 2014-04-13 MED ORDER — KETOCONAZOLE 2 % EX SHAM: 1.0000 "application " | MEDICATED_SHAMPOO | CUTANEOUS | Status: DC

## 2014-05-19 ENCOUNTER — Ambulatory Visit: Payer: Self-pay | Admitting: Physician Assistant

## 2015-03-13 ENCOUNTER — Ambulatory Visit (INDEPENDENT_AMBULATORY_CARE_PROVIDER_SITE_OTHER): Payer: BLUE CROSS/BLUE SHIELD | Admitting: Physician Assistant

## 2015-03-13 ENCOUNTER — Encounter: Payer: Self-pay | Admitting: Physician Assistant

## 2015-03-13 VITALS — BP 138/90 | HR 64 | Temp 98.4°F | Resp 18 | Ht 71.5 in | Wt 324.0 lb

## 2015-03-13 DIAGNOSIS — B360 Pityriasis versicolor: Secondary | ICD-10-CM

## 2015-03-13 DIAGNOSIS — M25552 Pain in left hip: Secondary | ICD-10-CM

## 2015-03-13 DIAGNOSIS — E785 Hyperlipidemia, unspecified: Secondary | ICD-10-CM

## 2015-03-13 DIAGNOSIS — R7309 Other abnormal glucose: Secondary | ICD-10-CM

## 2015-03-13 DIAGNOSIS — Z9119 Patient's noncompliance with other medical treatment and regimen: Secondary | ICD-10-CM | POA: Insufficient documentation

## 2015-03-13 DIAGNOSIS — Z91199 Patient's noncompliance with other medical treatment and regimen due to unspecified reason: Secondary | ICD-10-CM | POA: Insufficient documentation

## 2015-03-13 DIAGNOSIS — I10 Essential (primary) hypertension: Secondary | ICD-10-CM

## 2015-03-13 DIAGNOSIS — E039 Hypothyroidism, unspecified: Secondary | ICD-10-CM

## 2015-03-13 DIAGNOSIS — Z0000 Encounter for general adult medical examination without abnormal findings: Secondary | ICD-10-CM

## 2015-03-13 DIAGNOSIS — Z711 Person with feared health complaint in whom no diagnosis is made: Secondary | ICD-10-CM

## 2015-03-13 DIAGNOSIS — Z79899 Other long term (current) drug therapy: Secondary | ICD-10-CM | POA: Insufficient documentation

## 2015-03-13 DIAGNOSIS — E559 Vitamin D deficiency, unspecified: Secondary | ICD-10-CM

## 2015-03-13 DIAGNOSIS — Z0001 Encounter for general adult medical examination with abnormal findings: Secondary | ICD-10-CM

## 2015-03-13 DIAGNOSIS — R7303 Prediabetes: Secondary | ICD-10-CM

## 2015-03-13 DIAGNOSIS — R6889 Other general symptoms and signs: Secondary | ICD-10-CM

## 2015-03-13 LAB — CBC WITH DIFFERENTIAL/PLATELET
BASOS ABS: 0.1 10*3/uL (ref 0.0–0.1)
BASOS PCT: 1 % (ref 0–1)
Eosinophils Absolute: 0.1 10*3/uL (ref 0.0–0.7)
Eosinophils Relative: 2 % (ref 0–5)
HCT: 45.4 % (ref 39.0–52.0)
Hemoglobin: 15.5 g/dL (ref 13.0–17.0)
Lymphocytes Relative: 45 % (ref 12–46)
Lymphs Abs: 2.7 10*3/uL (ref 0.7–4.0)
MCH: 29.5 pg (ref 26.0–34.0)
MCHC: 34.1 g/dL (ref 30.0–36.0)
MCV: 86.5 fL (ref 78.0–100.0)
MPV: 9.8 fL (ref 8.6–12.4)
Monocytes Absolute: 0.5 10*3/uL (ref 0.1–1.0)
Monocytes Relative: 9 % (ref 3–12)
NEUTROS ABS: 2.6 10*3/uL (ref 1.7–7.7)
NEUTROS PCT: 43 % (ref 43–77)
Platelets: 302 10*3/uL (ref 150–400)
RBC: 5.25 MIL/uL (ref 4.22–5.81)
RDW: 14.4 % (ref 11.5–15.5)
WBC: 6 10*3/uL (ref 4.0–10.5)

## 2015-03-13 LAB — HEMOGLOBIN A1C
Hgb A1c MFr Bld: 5.7 % — ABNORMAL HIGH (ref <5.7)
Mean Plasma Glucose: 117 mg/dL — ABNORMAL HIGH (ref <117)

## 2015-03-13 MED ORDER — KETOCONAZOLE 2 % EX SHAM: 1.0000 "application " | MEDICATED_SHAMPOO | CUTANEOUS | Status: DC

## 2015-03-13 MED ORDER — ATORVASTATIN CALCIUM 20 MG PO TABS: 20.0000 mg | ORAL_TABLET | Freq: Every day | ORAL | Status: DC

## 2015-03-13 MED ORDER — MELOXICAM 15 MG PO TABS: ORAL_TABLET | ORAL | Status: DC

## 2015-03-13 NOTE — Progress Notes (Signed)
Complete Physical  Assessment and Plan: 1. Prediabetes Discussed general issues about diabetes pathophysiology and management., Educational material distributed., Suggested low cholesterol diet., Encouraged aerobic exercise., Discussed foot care., Reminded to get yearly retinal exam. - Hemoglobin A1c - Insulin, fasting  2. Morbid obesity Obesity with co morbidities- long discussion about weight loss, diet, and exercise - Testosterone  3. Hyperlipidemia -continue medications, check lipids, decrease fatty foods, increase activity.  - atorvastatin (LIPITOR) 20 MG tablet; Take 1 tablet (20 mg total) by mouth daily.  Dispense: 90 tablet; Refill: 0 - Lipid panel  4. Hypothyroidism, unspecified hypothyroidism type Hypothyroidism-check TSH level, will put in refill of medication after results, reminded to take on an empty stomach 30-45mins before food.  - TSH  5. Essential hypertension - CBC with Differential/Platelet - BASIC METABOLIC PANEL WITH GFR - Hepatic function panel - Urinalysis, Routine w reflex microscopic (not at [facility]) - Microalbumin / creatinine urine ratio  6. Vitamin D deficiency - Vit D  25 hydroxy (rtn osteoporosis monitoring)  7. Medication management - Magnesium  8. Routine general medical examination at a health care facility Discussed STD testing, safe sex, alcohol and drug awareness, drinking and driving dangers, wearing a seat belt and general safety measures for young adult.  9. Noncompliance Noncompliance- emphasized compliance with medications and appointments, patient expressed understanding of risks.   10. Left hip pain RICE, NSAIDS, exercises given, if not better  PT referral or ortho referral.  - Mobic  send to [facility] HIP UNILAT WITH PELVIS 2-3 VIEWS LEFT; Future  11. Concern about STD in male without diagnosis - GC/chlamydia probe amp, urine - RPR - HIV antibody - HSV(herpes simplex vrs) 1+2 ab-IgG  12. Tinea versicolor - ketoconazole  (NIZORAL) 2 % shampoo; Apply 1 application topically 2 (two) times a week. Apply first bottle 2 times a week and apply the refill bottle 4 times a week.  Lather and rinse after 5-10 minutes.  Dispense: 120 mL; Refill: 1   Discussed med's effects and SE's. Screening labs and tests as requested with regular follow-up as recommended.  HPI 29 y.o. male  presents for a complete physical. He has a history of noncompliance for appointments in the past. He has moved and states in the move, he is out of his medications and has not been taking them.  His blood pressure has been controlled at home, today their BP is BP: 138/90 mmHg He does workout but works hard at work at [employer]. He denies chest pain, shortness of breath, dizziness.  He is on cholesterol medication, he is out of his medications and denies myalgias. His cholesterol is at goal. The cholesterol last visit was:   Lab Results  Component Value Date   CHOL 182 03/09/2014   HDL 41 03/09/2014   LDLCALC 114* 03/09/2014   TRIG 136 03/09/2014   CHOLHDL 4.4 03/09/2014   He has been working on diet and exercise for prediabetes, and denies paresthesia of the feet, polydipsia and polyuria. Last A1C in the office was:  Lab Results  Component Value Date   HGBA1C 5.8* 03/09/2014   Patient is on Vitamin D supplement. Lab Results  Component Value Date   VD25OH 23* 03/09/2014   He is on thyroid medication. He is on 1.5 T/T and 1 pill the rest of the days, however he has been out of it for over 6 months. Patient denies nervousness, palpitations and weight changes.  Lab Results  Component Value Date   TSH 5.246* 03/09/2014  Left hip pain with movement/running/basketball. No catching, clicking, giving out. Occ in the morning. At his left groin.  He has been under stress, he is on probation, going to court tomorrow. Has 2 little girls.   BMI is Body mass index is 44.56 kg/(m^2)., he is working on diet and exercise. Wt Readings from Last 3  Encounters:  03/13/15 324 lb (146.965 kg)  03/09/14 311 lb (141.069 kg)  12/31/13 315 lb (142.883 kg)     Current Medications:  Current Outpatient Prescriptions on File Prior to Visit  Medication Sig Dispense Refill  . atorvastatin (LIPITOR) 20 MG tablet Take 1 tablet (20 mg total) by mouth daily. (Patient not taking: Reported on 03/13/2015) 90 tablet 0  . ketoconazole (NIZORAL) 2 % shampoo Apply 1 application topically 2 (two) times a week. Apply first bottle 2 times a week and apply the refill bottle 4 times a week.  Lather and rinse after 5-10 minutes. (Patient not taking: Reported on 03/13/2015) 120 mL 1   No current facility-administered medications on file prior to visit.   Health Maintenance:  Tetanus: 2007 Pneumovax:N/A Prevnar 13: N/A Flu vaccine: N/A Zostavax: N/A DEXA: N/A Colonoscopy: N/A EGD: N/A  He is sexually active, no symptoms, will get STD testing.  Wears seat belt  Allergies: No Known Allergies Medical History:  Past Medical History  Diagnosis Date  . Hypothyroid   . Vitamin D deficiency   . Hyperlipidemia    Surgical History: No past surgical history on file. Family History: No family history on file. Social History:   History  Substance Use Topics  . Smoking status: Former Smoker -- 0.25 packs/day for 1 years    Quit date: 03/09/2013  . Smokeless tobacco: Never Used  . Alcohol Use: No   Review of Systems  Constitutional: Negative.   HENT: Negative.   Eyes: Negative.   Respiratory: Negative.   Cardiovascular: Negative.   Gastrointestinal: Negative.   Genitourinary: Negative.   Musculoskeletal: Positive for joint pain (left hip pain, worse with exertion). Negative for myalgias, back pain, falls and neck pain.  Skin: Negative.   Neurological: Negative.   Endo/Heme/Allergies: Negative.   Psychiatric/Behavioral: Negative.     Physical Exam: Estimated body mass index is 44.56 kg/(m^2) as calculated from the following:   Height as of this  encounter: 5' 11.5" (1.816 m).   Weight as of this encounter: 324 lb (146.965 kg). BP 138/90 mmHg  Pulse 64  Temp(Src) 98.4 F (36.9 C) (Temporal)  Resp 18  Ht 5' 11.5" (1.816 m)  Wt 324 lb (146.965 kg)  BMI 44.56 kg/m2 General Appearance: Well nourished, in no apparent distress. Eyes: PERRLA, EOMs, conjunctiva no swelling or erythema, normal fundi and vessels. Sinuses: No Frontal/maxillary tenderness ENT/Mouth: Ext aud canals clear, normal light reflex with TMs without erythema, bulging. Good dentition. No erythema, swelling, or exudate on post pharynx. Tonsils not swollen or erythematous. Hearing normal.  Neck: Supple, thyroid normal. No bruits Respiratory: Respiratory effort normal, BS equal bilaterally without rales, rhonchi, wheezing or stridor. Cardio: RRR with occ PVCs without murmurs, rubs or gallops. Brisk peripheral pulses without edema.  Chest: symmetric, with normal excursions and percussion. Abdomen: Soft, +BS, obese, Non tender, no guarding, rebound, hernias, masses, or organomegaly. .  Lymphatics: Non tender without lymphadenopathy.  Genitourinary: defer Musculoskeletal: Full ROM all peripheral extremities,5/5 strength, and normal gait. Patient is able to ambulate well.  Gait is not  antalgic.Left hip: positives: pain with rotation and negatives: FROM no pain with heel impact pulses  full without SI pain.Good distal sensations and pulses bilaterally. Skin: Warm, dry without rashes, lesions, ecchymosis. Velvety stuck on non erythematous patches/plaques lighter in color than skin along bilateral arms, trunk, and neck.  Neuro: Cranial nerves intact, reflexes equal bilaterally. Normal muscle tone, no cerebellar symptoms. Sensation intact.  Psych: Awake and oriented X 3, normal affect, Insight and Judgment appropriate.   Quentin Mulling 10:11 AM

## 2015-03-13 NOTE — Patient Instructions (Addendum)
Bad carbs also include fruit juice, alcohol, and sweet tea. These are empty calories that do not signal to your brain that you are full. You can drink diet drinks, crystal light, ICE zero calorie drinks.   Please remember the good carbs are still carbs which convert into sugar. So please measure them out no more than 1/2-1 cup of rice, oatmeal, pasta, and beans  Veggies are however free foods! Pile them on.   Not all fruit is created equal. Please see the list below, the fruit at the bottom is higher in sugars than the fruit at the top. Please avoid all dried fruits.    Before you even begin to attack a weight-loss plan, it pays to remember this: You are not fat. You have fat. Losing weight isn't about blame or shame; it's simply another achievement to accomplish. Dieting is like any other skill-you have to buckle down and work at it. As long as you act in a smart, reasonable way, you'll ultimately get where you want to be. Here are some weight loss pearls for you.  1. It's Not a Diet. It's a Lifestyle Thinking of a diet as something you're on and suffering through only for the short term doesn't work. To shed weight and keep it off, you need to make permanent changes to the way you eat. It's OK to indulge occasionally, of course, but if you cut calories temporarily and then revert to your old way of eating, you'll gain back the weight quicker than you can say yo-yo. Use it to lose it. Research shows that one of the best predictors of long-term weight loss is how many pounds you drop in the first month. For that reason, nutritionists often suggest being stricter for the first two weeks of your new eating strategy to build momentum. Cut out added sugar and alcohol and avoid unrefined carbs. After that, figure out how you can reincorporate them in a way that's healthy and maintainable.  2. There's a Right Way to Exercise Working out burns calories and fat and boosts your metabolism by building  muscle. But those trying to lose weight are notorious for overestimating the number of calories they burn and underestimating the amount they take in. Unfortunately, your system is biologically programmed to hold on to extra pounds and that means when you start exercising, your body senses the deficit and ramps up its hunger signals. If you're not diligent, you'll eat everything you burn and then some. Use it to lose it. Cardio gets all the exercise glory, but strength and interval training are the real heroes. They help you build lean muscle, which in turn increases your metabolism and calorie-burning ability 3. Don't Overreact to Mild Hunger Some people have a hard time losing weight because of hunger anxiety. To them, being hungry is bad-something to be avoided at all costs-so they carry snacks with them and eat when they don't need to. Others eat because they're stressed out or bored. While you never want to get to the point of being ravenous (that's when bingeing is likely to happen), a hunger pang, a craving, or the fact that it's 3:00 p.m. should not send you racing for the vending machine or obsessing about the energy bar in your purse. Ideally, you should put off eating until your stomach is growling and it's difficult to concentrate.  Use it to lose it. When you feel the urge to eat, use the HALT method. Ask yourself, Am I really hungry? Or  am I angry or anxious, lonely or bored, or tired? If you're still not certain, try the apple test. If you're truly hungry, an apple should seem delicious; if it doesn't, something else is going on. Or you can try drinking water and making yourself busy, if you are still hungry try a healthy snack.  4. Not All Calories Are Created Equal The mechanics of weight loss are pretty simple: Take in fewer calories than you use for energy. But the kind of food you eat makes all the difference. Processed food that's high in saturated fat and refined starch or sugar can cause  inflammation that disrupts the hormone signals that tell your brain you're full. The result: You eat a lot more.  Use it to lose it. Clean up your diet. Swap in whole, unprocessed foods, including vegetables, lean protein, and healthy fats that will fill you up and give you the biggest nutritional bang for your calorie buck. In a few weeks, as your brain starts receiving regular hunger and fullness signals once again, you'll notice that you feel less hungry overall and naturally start cutting back on the amount you eat.  5. Protein, Produce, and Plant-Based Fats Are Your Weight-Loss Trinity Here's why eating the three Ps regularly will help you drop pounds. Protein fills you up. You need it to build lean muscle, which keeps your metabolism humming so that you can torch more fat. People in a weight-loss program who ate double the recommended daily allowance for protein (about 110 grams for a 150-pound woman) lost 70 percent of their weight from fat, while people who ate the RDA lost only about 40 percent, one study found. Produce is packed with filling fiber. "It's very difficult to consume too many calories if you're eating a lot of vegetables. Example: Three cups of broccoli is a lot of food, yet only 93 calories. (Fruit is another story. It can be easy to overeat and can contain a lot of calories from sugar, so be sure to monitor your intake.) Plant-based fats like olive oil and those in avocados and nuts are healthy and extra satiating.  Use it to lose it. Aim to incorporate each of the three Ps into every meal and snack. People who eat protein throughout the day are able to keep weight off, according to a study in the [employer]. In addition to meat, poultry and seafood, good sources are beans, lentils, eggs, tofu, and yogurt. As for fat, keep portion sizes in check by measuring out salad dressing, oil, and nut butters (shoot for one to two tablespoons). Finally, eat veggies  or a little fruit at every meal. People who did that consumed 308 fewer calories but didn't feel any hungrier than when they didn't eat more produce.  7. How You Eat Is As Important As What You Eat In order for your brain to register that you're full, you need to focus on what you're eating. Sit down whenever you eat, preferably at a table. Turn off the TV or computer, put down your phone, and look at your food. Smell it. Chew slowly, and don't put another bite on your fork until you swallow. When women ate lunch this attentively, they consumed 30 percent less when snacking later than those who listened to an audiobook at lunchtime, according to a study in the [place] of Nutrition. 8. Weighing Yourself Really Works The scale provides the best evidence about whether your efforts are paying off. Seeing the numbers tick  up or down or stagnate is motivation to keep going-or to rethink your approach. A 2015 study at [facility] found that daily weigh-ins helped people lose more weight, keep it off, and maintain that loss, even after two years. Use it to lose it. Step on the scale at the same time every day for the best results. If your weight shoots up several pounds from one weigh-in to the next, don't freak out. Eating a lot of salt the night before or having your period is the likely culprit. The number should return to normal in a day or two. It's a steady climb that you need to do something about. 9. Too Much Stress and Too Little Sleep Are Your Enemies When you're tired and frazzled, your body cranks up the production of cortisol, the stress hormone that can cause carb cravings. Not getting enough sleep also boosts your levels of ghrelin, a hormone associated with hunger, while suppressing leptin, a hormone that signals fullness and satiety. People on a diet who slept only five and a half hours a night for two weeks lost 55 percent less fat and were hungrier than those who slept eight and a  half hours, according to a study in the [place]. Use it to lose it. Prioritize sleep, aiming for seven hours or more a night, which research shows helps lower stress. And make sure you're getting quality zzz's. If a snoring spouse or a fidgety cat wakes you up frequently throughout the night, you may end up getting the equivalent of just four hours of sleep, according to a study from [facility]. Keep pets out of the bedroom, and use a white-noise app to drown out snoring. 10. You Will Hit a plateau-And You Can Bust Through It As you slim down, your body releases much less leptin, the fullness hormone.  If you're not strength training, start right now. Building muscle can raise your metabolism to help you overcome a plateau. To keep your body challenged and burning calories, incorporate new moves and more intense intervals into your workouts or add another sweat session to your weekly routine. Alternatively, cut an extra 100 calories or so a day from your diet. Now that you've lost weight, your body simply doesn't need as much fuel.    Preventive Care for Adults A healthy lifestyle and preventive care can promote health and wellness. Preventive health guidelines for men include the following key practices: 5. A routine yearly physical is a good way to check with your health care provider about your health and preventative screening. It is a chance to share any concerns and updates on your health and to receive a thorough exam. 6. Visit your dentist for a routine exam and preventative care every 6 months. Brush your teeth twice a day and floss once a day. Good oral hygiene prevents tooth decay and gum disease. 7. The frequency of eye exams is based on your age, health, family medical history, use of contact lenses, and other factors. Follow your health care provider's recommendations for frequency of eye exams. 8. Eat a healthy diet. Foods such as vegetables, fruits,  whole grains, low-fat dairy products, and lean protein foods contain the nutrients you need without too many calories. Decrease your intake of foods high in solid fats, added sugars, and salt. Eat the right amount of calories for you.Get information about a proper diet from your health care provider, if necessary. 9. Regular physical exercise is one of the most important  things you can do for your health. Most adults should get at least 150 minutes of moderate-intensity exercise (any activity that increases your heart rate and causes you to sweat) each week. In addition, most adults need muscle-strengthening exercises on 2 or more days a week. 10. Maintain a healthy weight. The body mass index (BMI) is a screening tool to identify possible weight problems. It provides an estimate of body fat based on height and weight. Your health care provider can find your BMI and can help you achieve or maintain a healthy weight.For adults 20 years and older: 1. A BMI below 18.5 is considered underweight. 2. A BMI of 18.5 to 24.9 is normal. 3. A BMI of 25 to 29.9 is considered overweight. 4. A BMI of 30 and above is considered obese. 11. Maintain normal blood lipids and cholesterol levels by exercising and minimizing your intake of saturated fat. Eat a balanced diet with plenty of fruit and vegetables. Blood tests for lipids and cholesterol should begin at age 82 and be repeated every 5 years. If your lipid or cholesterol levels are high, you are over 50, or you are at high risk for heart disease, you may need your cholesterol levels checked more frequently.Ongoing high lipid and cholesterol levels should be treated with medicines if diet and exercise are not working. 12. If you smoke, find out from your health care provider how to quit. If you do not use tobacco, do not start. 60. Lung cancer screening is recommended for adults aged 13-80 years who are at high risk for developing lung cancer because of a history of  smoking. A yearly low-dose CT scan of the lungs is recommended for people who have at least a 30-pack-year history of smoking and are a current smoker or have quit within the past 15 years. A pack year of smoking is smoking an average of 1 pack of cigarettes a day for 1 year (for example: 1 pack a day for 30 years or 2 packs a day for 15 years). Yearly screening should continue until the smoker has stopped smoking for at least 15 years. Yearly screening should be stopped for people who develop a health problem that would prevent them from having lung cancer treatment. 14. If you choose to drink alcohol, do not have more than 2 drinks per day. One drink is considered to be 12 ounces (355 mL) of beer, 5 ounces (148 mL) of wine, or 1.5 ounces (44 mL) of liquor. 15. High blood pressure causes heart disease and increases the risk of stroke. Your blood pressure should be checked. Ongoing high blood pressure should be treated with medicines, if weight loss and exercise are not effective. 70. If you are 5-92 years old, ask your health care provider if you should take aspirin to prevent heart disease. 17. Diabetes screening involves taking a blood sample to check your fasting blood sugar level. Testing should be considered at a younger age or be carried out more frequently if you are overweight and have at least 1 risk factor for diabetes. 18. Colorectal cancer can be detected and often prevented. Most routine colorectal cancer screening begins at the age of 74 and continues through age 34. However, your health care provider may recommend screening at an earlier age if you have risk factors for colon cancer. On a yearly basis, your health care provider may provide home test kits to check for hidden blood in the stool. Use of a small camera at the end of a  tube to directly examine the colon (sigmoidoscopy or colonoscopy) can detect the earliest forms of colorectal cancer. Talk to your health care provider about this at  age 64, when routine screening begins. Direct exam of the colon should be repeated every 5-10 years through age 47, unless early forms of precancerous polyps or small growths are found. 70. Screening for abdominal aortic aneurysm (AAA)  are recommended for persons over age 37 who have history of hypertensionor who are current or former smokers. 81. Talk with your health care provider about prostate cancer screening. 21. Testicular cancer screening is recommended for adult males. Screening includes self-exam, a health care provider exam, and other screening tests. Consult with your health care provider about any symptoms you have or any concerns you have about testicular cancer. 22. Use sunscreen. Apply sunscreen liberally and repeatedly throughout the day. You should seek shade when your shadow is shorter than you. Protect yourself by wearing long sleeves, pants, a wide-brimmed hat, and sunglasses year round, whenever you are outdoors. 23. Once a month, do a whole-body skin exam, using a mirror to look at the skin on your back. Tell your health care provider about new moles, moles that have irregular borders, moles that are larger than a pencil eraser, or moles that have changed in shape or color. 24. Stay current with required vaccines (immunizations). 1. Influenza vaccine. All adults should be immunized every year. 2. Tetanus, diphtheria, and acellular pertussis (Td, Tdap) vaccine. An adult who has not previously received Tdap or who does not know his vaccine status should receive 1 dose of Tdap. This initial dose should be followed by tetanus and diphtheria toxoids (Td) booster doses every 10 years. Adults with an unknown or incomplete history of completing a 3-dose immunization series with Td-containing vaccines should begin or complete a primary immunization series including a Tdap dose. Adults should receive a Td booster every 10 years. 3. Zoster vaccine. One dose is recommended for adults aged 18  years or older unless certain conditions are present. 4.  5. Pneumococcal 13-valent conjugate (PCV13) vaccine. When indicated, a person who is uncertain of his immunization history and has no record of immunization should receive the PCV13 vaccine. An adult aged 49 years or older who has certain medical conditions and has not been previously immunized should receive 1 dose of PCV13 vaccine. This PCV13 should be followed with a dose of pneumococcal polysaccharide (PPSV23) vaccine. The PPSV23 vaccine dose should be obtained at least 8 weeks after the dose of PCV13 vaccine. An adult aged 50 years or older who has certain medical conditions and previously received 1 or more doses of PPSV23 vaccine should receive 1 dose of PCV13. The PCV13 vaccine dose should be obtained 1 or more years after the last PPSV23 vaccine dose. 6.  7. Pneumococcal polysaccharide (PPSV23) vaccine. When PCV13 is also indicated, PCV13 should be obtained first. All adults aged 63 years and older should be immunized. An adult younger than age 77 years who has certain medical conditions should be immunized. Any person who resides in a nursing home or long-term care facility should be immunized. An adult smoker should be immunized. People with an immunocompromised condition and certain other conditions should receive both PCV13 and PPSV23 vaccines. People with human immunodeficiency virus (HIV) infection should be immunized as soon as possible after diagnosis. Immunization during chemotherapy or radiation therapy should be avoided. Routine use of PPSV23 vaccine is not recommended for American Indians, [place] Natives, or people younger than 6 years  unless there are medical conditions that require PPSV23 vaccine. When indicated, people who have unknown immunization and have no record of immunization should receive PPSV23 vaccine. One-time revaccination 5 years after the first dose of PPSV23 is recommended for people aged 19-64 years who have  chronic kidney failure, nephrotic syndrome, asplenia, or immunocompromised conditions. People who received 1-2 doses of PPSV23 before age 72 years should receive another dose of PPSV23 vaccine at age 23 years or later if at least 5 years have passed since the previous dose. Doses of PPSV23 are not needed for people immunized with PPSV23 at or after age 27 years. 8. Hepatitis A vaccine. Adults who wish to be protected from this disease, have certain high-risk conditions, work with hepatitis A-infected animals, work in hepatitis A research labs, or travel to or work in countries with a high rate of hepatitis A should be immunized. Adults who were previously unvaccinated and who anticipate close contact with an international adoptee during the first 60 days after arrival in the [place] from a country with a high rate of hepatitis A should be immunized. 9. Hepatitis B vaccine. Adults should be immunized if they wish to be protected from this disease, have certain high-risk conditions, may be exposed to blood or other infectious body fluids, are household contacts or sex partners of hepatitis B positive people, are clients or workers in certain care facilities, or travel to or work in countries with a high rate of hepatitis B.  Preventive Service / Frequency  Ages 57 to 81 1. Blood pressure check. 2. Lipid and cholesterol check. 3. Hepatitis C blood test.** / For any individual with known risks for hepatitis C. 4. Skin self-exam. / Monthly. 5. Influenza vaccine. / Every year. 6. Tetanus, diphtheria, and acellular pertussis (Tdap, Td) vaccine.** / Consult your health care provider. 1 dose of Td every 10 years. 7. HPV vaccine. / 3 doses over 6 months, if 38 or younger. 8. Measles, mumps, rubella (MMR) vaccine.** / You need at least 1 dose of MMR if you were born in 1957 or later. You may also need a second dose. 9. Pneumococcal 13-valent conjugate (PCV13) vaccine.** / Consult your health care  provider. 10. Pneumococcal polysaccharide (PPSV23) vaccine.** / 1 to 2 doses if you smoke cigarettes or if you have certain conditions. 11. Meningococcal vaccine.** / 1 dose if you are age 28 to 48 years and a Market researcher living in a residence hall, or have one of several medical conditions. You may also need additional booster doses. 12. Hepatitis A vaccine.** / Consult your health care provider. 13. Hepatitis B vaccine.** / Consult your health care provider.  Keeping you healthy  Chlamydia, HIV, and other sexual transmitted disease- Get screened each year until the age of 51 then within three months of each new sexual partner.  Don't smoke- If you do smoke, ask your healthcare provider about quitting. For tips on how to quit, go to www.smokefree.gov or call 1-800-QUIT-NOW.  Be physically active- Exercise 5 days a week for at least 30 minutes.  If you are not already physically active start slow and gradually work up to 30 minutes of moderate physical activity.  Examples of moderate activity include walking briskly, mowing the yard, dancing, swimming bicycling, etc.  Eat a healthy diet- Eat a variety of healthy foods such as fruits, vegetables, low fat milk, low fat cheese, yogurt, lean meats, poultry, fish, beans, tofu, etc.  For more information on healthy eating, go to www.thenutritionsource.org  Drink alcohol in moderation- Limit alcohol intake two drinks or less a day.  Never drink and drive.  Dentist- Brush and floss teeth twice daily; visit your dentis twice a year.  Depression-Your emotional health is as important as your physical health.  If you're feeling down, losing interest in things you normally enjoy please talk with your healthcare provider.  Gun Safety- If you keep a gun in your home, keep it unloaded and with the safety lock on.  Bullets should be stored separately.  Helmet use- Always wear a helmet when riding a motorcycle, bicycle, rollerblading or  skateboarding.  Safe sex- If you may be exposed to a sexually transmitted infection, use a condom  Seat belts- Seat bels can save your life; always wear one.  Smoke/Carbon Monoxide detectors- These detectors need to be installed on the appropriate level of your home.  Replace batteries at least once a year.  Skin Cancer- When out in the sun, cover up and use sunscreen SPF 15 or higher.  Violence- If anyone is threatening or hurting you, please tell your healthcare provider.

## 2015-03-14 LAB — HEPATIC FUNCTION PANEL
ALBUMIN: 4.4 g/dL (ref 3.5–5.2)
ALT: 22 U/L (ref 0–53)
AST: 21 U/L (ref 0–37)
Alkaline Phosphatase: 50 U/L (ref 39–117)
BILIRUBIN DIRECT: 0.1 mg/dL (ref 0.0–0.3)
Indirect Bilirubin: 0.3 mg/dL (ref 0.2–1.2)
TOTAL PROTEIN: 6.8 g/dL (ref 6.0–8.3)
Total Bilirubin: 0.4 mg/dL (ref 0.2–1.2)

## 2015-03-14 LAB — URINALYSIS, ROUTINE W REFLEX MICROSCOPIC
Bilirubin Urine: NEGATIVE
Glucose, UA: NEGATIVE mg/dL
HGB URINE DIPSTICK: NEGATIVE
Ketones, ur: NEGATIVE mg/dL
LEUKOCYTES UA: NEGATIVE
Nitrite: NEGATIVE
Protein, ur: NEGATIVE mg/dL
SPECIFIC GRAVITY, URINE: 1.022 (ref 1.005–1.030)
Urobilinogen, UA: 0.2 mg/dL (ref 0.0–1.0)
pH: 6 (ref 5.0–8.0)

## 2015-03-14 LAB — BASIC METABOLIC PANEL WITH GFR
BUN: 12 mg/dL (ref 6–23)
CALCIUM: 9.8 mg/dL (ref 8.4–10.5)
CO2: 27 mEq/L (ref 19–32)
Chloride: 104 mEq/L (ref 96–112)
Creat: 1.37 mg/dL — ABNORMAL HIGH (ref 0.50–1.35)
GFR, EST NON AFRICAN AMERICAN: 70 mL/min
GFR, Est African American: 81 mL/min
GLUCOSE: 86 mg/dL (ref 70–99)
Potassium: 4.4 mEq/L (ref 3.5–5.3)
Sodium: 140 mEq/L (ref 135–145)

## 2015-03-14 LAB — LIPID PANEL
CHOL/HDL RATIO: 5.4 ratio
CHOLESTEROL: 204 mg/dL — AB (ref 0–200)
HDL: 38 mg/dL — ABNORMAL LOW (ref >=40)
LDL Cholesterol: 135 mg/dL — ABNORMAL HIGH (ref 0–99)
TRIGLYCERIDES: 156 mg/dL — AB (ref <150)
VLDL: 31 mg/dL (ref 0–40)

## 2015-03-14 LAB — MICROALBUMIN / CREATININE URINE RATIO
Creatinine, Urine: 237.7 mg/dL
Microalb Creat Ratio: 4.2 mg/g (ref 0.0–30.0)
Microalb, Ur: 1 mg/dL (ref <2.0)

## 2015-03-14 LAB — HIV ANTIBODY (ROUTINE TESTING W REFLEX): HIV 1&2 Ab, 4th Generation: NONREACTIVE

## 2015-03-14 LAB — GC/CHLAMYDIA PROBE AMP, URINE
Chlamydia, Swab/Urine, PCR: NEGATIVE
GC PROBE AMP, URINE: NEGATIVE

## 2015-03-14 LAB — HSV(HERPES SIMPLEX VRS) I + II AB-IGG
HSV 1 Glycoprotein G Ab, IgG: 7.24 IV — ABNORMAL HIGH
HSV 2 Glycoprotein G Ab, IgG: 9.54 IV — ABNORMAL HIGH

## 2015-03-14 LAB — TESTOSTERONE: Testosterone: 209 ng/dL — ABNORMAL LOW (ref 300–890)

## 2015-03-14 LAB — MAGNESIUM: Magnesium: 2.1 mg/dL (ref 1.5–2.5)

## 2015-03-14 LAB — INSULIN, FASTING: Insulin fasting, serum: 13.1 u[IU]/mL (ref 2.0–19.6)

## 2015-03-14 LAB — TSH: TSH: 8.062 u[IU]/mL — ABNORMAL HIGH (ref 0.350–4.500)

## 2015-03-14 LAB — VITAMIN D 25 HYDROXY (VIT D DEFICIENCY, FRACTURES): VIT D 25 HYDROXY: 10 ng/mL — AB (ref 30–100)

## 2015-03-14 LAB — RPR

## 2015-03-14 MED ORDER — LEVOTHYROXINE SODIUM 75 MCG PO TABS: 75.0000 ug | ORAL_TABLET | Freq: Every day | ORAL | Status: DC

## 2015-03-14 NOTE — Addendum Note (Signed)
Addended by: Quentin Mulling R on: 03/14/2015 03:50 PM   Modules accepted: Orders, SmartSet

## 2015-03-24 ENCOUNTER — Ambulatory Visit (HOSPITAL_COMMUNITY)
Admission: RE | Admit: 2015-03-24 | Discharge: 2015-03-24 | Disposition: A | Discharge status: Home / Self Care | Payer: BLUE CROSS/BLUE SHIELD | Source: Ambulatory Visit | Attending: Physician Assistant | Admitting: Physician Assistant

## 2015-03-24 DIAGNOSIS — G8929 Other chronic pain: Secondary | ICD-10-CM | POA: Insufficient documentation

## 2015-03-24 DIAGNOSIS — M25552 Pain in left hip: Secondary | ICD-10-CM | POA: Diagnosis not present

## 2015-03-24 DIAGNOSIS — M25852 Other specified joint disorders, left hip: Secondary | ICD-10-CM | POA: Diagnosis not present

## 2015-04-24 ENCOUNTER — Other Ambulatory Visit: Payer: Self-pay

## 2015-04-28 ENCOUNTER — Other Ambulatory Visit: Payer: Self-pay

## 2015-05-26 ENCOUNTER — Emergency Department (HOSPITAL_COMMUNITY)
Admission: EM | Admit: 2015-05-26 | Discharge: 2015-05-26 | Disposition: A | Discharge status: Home / Self Care | Payer: BLUE CROSS/BLUE SHIELD | Attending: Emergency Medicine | Admitting: Emergency Medicine

## 2015-05-26 ENCOUNTER — Emergency Department (HOSPITAL_COMMUNITY): Payer: BLUE CROSS/BLUE SHIELD

## 2015-05-26 ENCOUNTER — Encounter (HOSPITAL_COMMUNITY): Payer: Self-pay | Admitting: *Deleted

## 2015-05-26 DIAGNOSIS — Z87891 Personal history of nicotine dependence: Secondary | ICD-10-CM | POA: Insufficient documentation

## 2015-05-26 DIAGNOSIS — R0789 Other chest pain: Secondary | ICD-10-CM | POA: Insufficient documentation

## 2015-05-26 DIAGNOSIS — Z79899 Other long term (current) drug therapy: Secondary | ICD-10-CM | POA: Insufficient documentation

## 2015-05-26 DIAGNOSIS — R079 Chest pain, unspecified: Secondary | ICD-10-CM | POA: Diagnosis present

## 2015-05-26 DIAGNOSIS — E039 Hypothyroidism, unspecified: Secondary | ICD-10-CM | POA: Insufficient documentation

## 2015-05-26 DIAGNOSIS — E785 Hyperlipidemia, unspecified: Secondary | ICD-10-CM | POA: Diagnosis not present

## 2015-05-26 LAB — CBC
HCT: 43.1 % (ref 39.0–52.0)
HEMOGLOBIN: 14.9 g/dL (ref 13.0–17.0)
MCH: 29.3 pg (ref 26.0–34.0)
MCHC: 34.6 g/dL (ref 30.0–36.0)
MCV: 84.7 fL (ref 78.0–100.0)
Platelets: 278 10*3/uL (ref 150–400)
RBC: 5.09 MIL/uL (ref 4.22–5.81)
RDW: 13.2 % (ref 11.5–15.5)
WBC: 7.8 10*3/uL (ref 4.0–10.5)

## 2015-05-26 LAB — BASIC METABOLIC PANEL
ANION GAP: 8 (ref 5–15)
BUN: 13 mg/dL (ref 6–20)
CHLORIDE: 107 mmol/L (ref 101–111)
CO2: 27 mmol/L (ref 22–32)
Calcium: 9.1 mg/dL (ref 8.9–10.3)
Creatinine, Ser: 1.37 mg/dL — ABNORMAL HIGH (ref 0.61–1.24)
GFR calc non Af Amer: >60 mL/min (ref >60)
Glucose, Bld: 103 mg/dL — ABNORMAL HIGH (ref 65–99)
POTASSIUM: 3.7 mmol/L (ref 3.5–5.1)
Sodium: 142 mmol/L (ref 135–145)

## 2015-05-26 LAB — I-STAT TROPONIN, ED: Troponin i, poc: 0 ng/mL (ref 0.00–0.08)

## 2015-05-26 MED ORDER — IBUPROFEN 800 MG PO TABS: 800.0000 mg | ORAL_TABLET | Freq: Three times a day (TID) | ORAL | Status: DC | PRN

## 2015-05-26 MED ORDER — KETOROLAC TROMETHAMINE 30 MG/ML IJ SOLN
30.0000 mg | Freq: Once | INTRAMUSCULAR | Status: AC
  Administered 2015-05-26: 30 mg via INTRAVENOUS
  Filled 2015-05-26: qty 1

## 2015-05-26 MED ORDER — TRAMADOL HCL 50 MG PO TABS: 50.0000 mg | ORAL_TABLET | Freq: Four times a day (QID) | ORAL | Status: DC | PRN

## 2015-05-26 NOTE — ED Provider Notes (Signed)
TIME SEEN: 5:50 AM  CHIEF COMPLAINT: Chest pain  HPI: Pt is a 29 y.o. male with previous history of hyperlipidemia, former smoker who quit in 2014 who presents to the emergency department with chest pain that has been present for the past 24 hours. Described as diffuse across the chest, sharp and throbbing. Moderate in nature without radiation. Worse with movement and deep inspiration. Some improvement with 400 mg of ibuprofen yesterday. No shortness of breath, nausea, vomiting, dizziness. No history of hypertension or diabetes. No family history of premature CAD. Denies history of PE or DVT, recent prolonged immobilization such as long flight, hospitalization, surgery, trauma, fracture. Denies fever or cough.   ROS: See HPI Constitutional: no fever  Eyes: no drainage  ENT: no runny nose   Cardiovascular:   chest pain  Resp: no SOB  GI: no vomiting GU: no dysuria Integumentary: no rash  Allergy: no hives  Musculoskeletal: no leg swelling  Neurological: no slurred speech ROS otherwise negative  PAST MEDICAL HISTORY/PAST SURGICAL HISTORY:  Past Medical History  Diagnosis Date  . Hypothyroid   . Vitamin D deficiency   . Hyperlipidemia     MEDICATIONS:  Prior to Admission medications   Medication Sig Start Date End Date Taking? Authorizing Provider  atorvastatin (LIPITOR) 20 MG tablet Take 1 tablet (20 mg total) by mouth daily. 03/13/15 03/12/16 Yes Quentin Mulling, PA-C  ibuprofen (ADVIL,MOTRIN) 200 MG tablet Take 400 mg by mouth every 6 (six) hours as needed for moderate pain.   Yes Historical Provider, MD  levothyroxine (SYNTHROID) 75 MCG tablet Take 1 tablet (75 mcg total) by mouth daily. 03/14/15 03/13/16 Yes Quentin Mulling, PA-C  ketoconazole (NIZORAL) 2 % shampoo Apply 1 application topically 2 (two) times a week. Apply first bottle 2 times a week and apply the refill bottle 4 times a week.  Lather and rinse after 5-10 minutes. Patient not taking: Reported on 05/26/2015 03/13/15    Quentin Mulling, PA-C  meloxicam [facility]) 15 MG tablet Take one daily with food for 2 weeks, can take with tylenol, can not take with aleve, iburpofen, then as needed daily for pain Patient not taking: Reported on 05/26/2015 03/13/15   Quentin Mulling, PA-C    ALLERGIES:  No Known Allergies  SOCIAL HISTORY:  Social History  Substance Use Topics  . Smoking status: Former Smoker -- 0.25 packs/day for 1 years    Quit date: 03/09/2013  . Smokeless tobacco: Never Used  . Alcohol Use: No    FAMILY HISTORY: No family history on file.  EXAM: BP 146/90 mmHg  Pulse 67  Temp(Src) 97.8 F (36.6 C) (Oral)  Resp 22  SpO2 100% CONSTITUTIONAL: Alert and oriented and responds appropriately to questions. Well-appearing; well-nourished HEAD: Normocephalic EYES: Conjunctivae clear, PERRL ENT: normal nose; no rhinorrhea; moist mucous membranes; pharynx without lesions noted NECK: Supple, no meningismus, no LAD  CARD: RRR; S1 and S2 appreciated; no murmurs, no clicks, no rubs, no gallops RESP: Normal chest excursion without splinting or tachypnea; breath sounds clear and equal bilaterally; no wheezes, no rhonchi, no rales, no hypoxia or respiratory distress, speaking full sentences CHEST:  Chest wall tender to palpation without crepitus or ecchymosis or deformity ABD/GI: Normal bowel sounds; non-distended; soft, non-tender, no rebound, no guarding, no peritoneal signs BACK:  The back appears normal and is non-tender to palpation, there is no CVA tenderness EXT: Normal ROM in all joints; non-tender to palpation; no edema; normal capillary refill; no cyanosis, no calf tenderness or swelling  SKIN: Normal color for age and race; warm NEURO: Moves all extremities equally, sensation to light touch intact diffusely, cranial nerves II through XII intact PSYCH: The patient's mood and manner are appropriate. Grooming and personal hygiene are appropriate.  MEDICAL DECISION MAKING: Patient here with chest wall  pain. Labs ordered in triage are unremarkable other than a creatinine of 1.37 with normal BUN. This appears to be chronic for patient and likely secondary to his size, muscle composition.  I feel he is safe for a short course of anti-inflammatory for pain control. He has no risk factors for ACS other than hyperlipidemia no risk factors for pulmonary embolus. I feel he is safe to be discharged home. EKG shows no ischemic changes, early repolarization. Chest x-ray is clear. Discussed usual and customary return precautions. He verbalizes understanding and is comfortable with this plan.     EKG Interpretation  Date/Time:  Friday May 26 2015 05:16:40 EDT Ventricular Rate:  66 PR Interval:  168 QRS Duration: 94 QT Interval:  411 QTC Calculation: 431 R Axis:   74 Text Interpretation:  Sinus rhythm ST elev, probable normal early repol pattern No old tracing to compare Confirmed by Anntonette Madewell,  DO, Sydny Schnitzler [phone]) on 05/26/2015 5:29:06 AM        Layla Maw Dezi Schaner, DO 05/26/15 [id number]

## 2015-05-26 NOTE — ED Notes (Signed)
Pt states that he began to have chest pain yesterday; pt describes it as his whole chest; pt states that pain radiates to his back; pt denies shortness of breath or any other sx

## 2015-05-26 NOTE — Discharge Instructions (Signed)

## 2015-06-19 ENCOUNTER — Other Ambulatory Visit: Payer: Self-pay

## 2015-06-19 ENCOUNTER — Ambulatory Visit: Payer: Self-pay | Admitting: Physician Assistant

## 2015-06-19 MED ORDER — LEVOTHYROXINE SODIUM 75 MCG PO TABS: 75.0000 ug | ORAL_TABLET | Freq: Every day | ORAL | Status: DC

## 2015-12-07 ENCOUNTER — Emergency Department (HOSPITAL_COMMUNITY)
Admission: EM | Admit: 2015-12-07 | Discharge: 2015-12-07 | Disposition: A | Discharge status: Home / Self Care | Payer: BLUE CROSS/BLUE SHIELD | Attending: Emergency Medicine | Admitting: Emergency Medicine

## 2015-12-07 ENCOUNTER — Encounter (HOSPITAL_COMMUNITY): Payer: Self-pay

## 2015-12-07 DIAGNOSIS — R197 Diarrhea, unspecified: Secondary | ICD-10-CM | POA: Insufficient documentation

## 2015-12-07 DIAGNOSIS — R112 Nausea with vomiting, unspecified: Secondary | ICD-10-CM | POA: Diagnosis not present

## 2015-12-07 DIAGNOSIS — R1084 Generalized abdominal pain: Secondary | ICD-10-CM | POA: Insufficient documentation

## 2015-12-07 MED ORDER — ONDANSETRON 4 MG PO TBDP
4.0000 mg | ORAL_TABLET | Freq: Once | ORAL | Status: AC | PRN
  Administered 2015-12-07: 4 mg via ORAL
  Filled 2015-12-07: qty 1

## 2015-12-07 NOTE — ED Notes (Signed)
Pt c/o generalized abdominal pain and n/v/d starting last night.  Pain score 9/10.  Pt reports taking Pepto Bismol w/o relief.  Denies being around anyone sick.

## 2015-12-07 NOTE — ED Notes (Signed)
Patient called for vital sign recheck with no response. 

## 2016-03-07 ENCOUNTER — Encounter (HOSPITAL_COMMUNITY): Payer: Self-pay | Admitting: Emergency Medicine

## 2016-03-07 ENCOUNTER — Emergency Department (HOSPITAL_COMMUNITY)
Admission: EM | Admit: 2016-03-07 | Discharge: 2016-03-07 | Disposition: A | Discharge status: Home / Self Care | Payer: BLUE CROSS/BLUE SHIELD | Attending: Emergency Medicine | Admitting: Emergency Medicine

## 2016-03-07 ENCOUNTER — Emergency Department (HOSPITAL_COMMUNITY): Payer: BLUE CROSS/BLUE SHIELD

## 2016-03-07 DIAGNOSIS — R0789 Other chest pain: Secondary | ICD-10-CM | POA: Insufficient documentation

## 2016-03-07 DIAGNOSIS — Z79899 Other long term (current) drug therapy: Secondary | ICD-10-CM | POA: Diagnosis not present

## 2016-03-07 DIAGNOSIS — Z87891 Personal history of nicotine dependence: Secondary | ICD-10-CM | POA: Diagnosis not present

## 2016-03-07 DIAGNOSIS — R079 Chest pain, unspecified: Secondary | ICD-10-CM | POA: Diagnosis present

## 2016-03-07 LAB — BASIC METABOLIC PANEL
Anion gap: 6 (ref 5–15)
BUN: 12 mg/dL (ref 6–20)
CO2: 27 mmol/L (ref 22–32)
Calcium: 9.2 mg/dL (ref 8.9–10.3)
Chloride: 106 mmol/L (ref 101–111)
Creatinine, Ser: 1.35 mg/dL — ABNORMAL HIGH (ref 0.61–1.24)
GFR calc Af Amer: >60 mL/min (ref >60)
GFR calc non Af Amer: >60 mL/min (ref >60)
Glucose, Bld: 102 mg/dL — ABNORMAL HIGH (ref 65–99)
Potassium: 4 mmol/L (ref 3.5–5.1)
Sodium: 139 mmol/L (ref 135–145)

## 2016-03-07 LAB — I-STAT TROPONIN, ED: Troponin i, poc: 0.01 ng/mL (ref 0.00–0.08)

## 2016-03-07 LAB — CBC
HCT: 41.6 % (ref 39.0–52.0)
Hemoglobin: 14.5 g/dL (ref 13.0–17.0)
MCH: 28.7 pg (ref 26.0–34.0)
MCHC: 34.9 g/dL (ref 30.0–36.0)
MCV: 82.2 fL (ref 78.0–100.0)
Platelets: 292 10*3/uL (ref 150–400)
RBC: 5.06 MIL/uL (ref 4.22–5.81)
RDW: 13.2 % (ref 11.5–15.5)
WBC: 6.7 10*3/uL (ref 4.0–10.5)

## 2016-03-07 MED ORDER — NAPROXEN 500 MG PO TABS: 500.0000 mg | ORAL_TABLET | Freq: Two times a day (BID) | ORAL | Status: DC

## 2016-03-07 NOTE — Discharge Instructions (Signed)
Take the prescribed medication as directed. °Follow-up with your primary care doctor. °Return to the ED for new or worsening symptoms. °

## 2016-03-07 NOTE — ED Notes (Signed)
Patient here with complaints of sudden onset generalized chest pain that started last night while watching tv. SOB with onset. Denies n/v/d.

## 2016-03-07 NOTE — ED Notes (Signed)
Patient transported to X-ray 

## 2016-03-07 NOTE — ED Provider Notes (Signed)
CSN: [id number]     Arrival date & time 03/07/16  1018 History   First MD Initiated Contact with Patient 03/07/16 1102     Chief Complaint  Patient presents with  . Chest Pain     (Consider location/radiation/quality/duration/timing/severity/associated sxs/prior Treatment) Patient is a 30 y.o. male presenting with chest pain. The history is provided by the patient and medical records.  Chest Pain   30 year old male with history of hypothyroidism, hyperlipidemia, vitamin D deficiency, presenting to the ED for chest pain. He reports this began last night while he was sitting at home watching a basketball game. He states he has generalized, aching pain throughout his entire chest. He reports some associated shortness of breath. Does not seem to be an exertional component. Denies any cough or recent illness.  No dizziness, diaphoresis, nausea, or vomiting. No cardiac history. No family cardiac history. Patient has an occasional smoker. No history of asthma or COPD.  Denies any recent travel or prolonged immobilization. No recent surgeries. No history of DVT or PE. No shortness activity to include heavy lifting, pushing, pulling, etc. No intervention tried PTA.  Of note, patient reports he was supposed to be in court today.  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  Past Medical History  Diagnosis Date  . Hypothyroid   . Vitamin D deficiency   . Hyperlipidemia    History reviewed. No pertinent past surgical history. History reviewed. No pertinent family history. Social History  Substance Use Topics  . Smoking status: Former Smoker -- 0.25 packs/day for 1 years    Quit date: 03/09/2013  . Smokeless tobacco: Never Used  . Alcohol Use: No    Review of Systems  Cardiovascular: Positive for chest pain.  All other systems reviewed and are negative.     Allergies  Review of patient's allergies indicates no known allergies.  Home Medications   Prior to Admission  medications   Medication Sig Start Date End Date Taking? Authorizing Provider  atorvastatin (LIPITOR) 20 MG tablet Take 1 tablet (20 mg total) by mouth daily. 03/13/15 03/12/16  Quentin Mulling, PA-C  ibuprofen (ADVIL,MOTRIN) 800 MG tablet Take 1 tablet (800 mg total) by mouth every 8 (eight) hours as needed for mild pain. 05/26/15   Kristen N Ward, DO  levothyroxine (SYNTHROID) 75 MCG tablet Take 1 tablet (75 mcg total) by mouth daily. 06/19/15 06/18/16  [name], MD  traMADol (ULTRAM) 50 MG tablet Take 1 tablet (50 mg total) by mouth every 6 (six) hours as needed. 05/26/15   Kristen N Ward, DO   BP 97/58 mmHg  Pulse 66  Temp(Src) 98.1 F (36.7 C) (Oral)  Resp 25  SpO2 100%   Physical Exam  Constitutional: He is oriented to person, place, and time. He appears well-developed and well-nourished. No distress.  HENT:  Head: Normocephalic and atraumatic.  Mouth/Throat: Oropharynx is clear and moist.  Eyes: Conjunctivae and EOM are normal. Pupils are equal, round, and reactive to light.  Neck: Normal range of motion.  Cardiovascular: Normal rate, regular rhythm and normal heart sounds.   Pulmonary/Chest: Effort normal and breath sounds normal. No respiratory distress. He has no wheezes.  Generalized tenderness of the chest wall without acute bony deformity or signs of trauma, lungs clear bilaterally, no distress  Abdominal: Soft. Bowel sounds are normal. There is no tenderness. There is no guarding.  Musculoskeletal: Normal range of motion. He exhibits no edema.  Neurological: He is alert and oriented to person, place,  and time.  Skin: Skin is warm and dry. He is not diaphoretic.  Psychiatric: He has a normal mood and affect.  Nursing note and vitals reviewed.   ED Course  Procedures (including critical care time) Labs Review Labs Reviewed  BASIC METABOLIC PANEL - Abnormal; Notable for the following:    Glucose, Bld 102 (*)    Creatinine, Ser 1.35 (*)    All other components within  normal limits  CBC  I-STAT TROPOININ, ED    Imaging Review Dg Chest 2 View  03/07/2016  CLINICAL DATA:  Mid chest pain and shortness of breath since last night, occasional smoker EXAM: CHEST  2 VIEW COMPARISON:  05/26/2015 FINDINGS: The heart size and mediastinal contours are within normal limits. Both lungs are clear. The visualized skeletal structures are unremarkable. IMPRESSION: No active cardiopulmonary disease. Electronically Signed   By: Esperanza Heiraymond  Rubner M.D.   On: 03/07/2016 11:50   I have personally reviewed and evaluated these images and lab results as part of my medical decision-making.   EKG Interpretation   Date/Time:  Thursday March 07 2016 10:25:52 EDT Ventricular Rate:  65 PR Interval:  146 QRS Duration: 93 QT Interval:  392 QTC Calculation: 408 R Axis:   63 Text Interpretation:  Sinus rhythm Early repolarization No significant  change since last tracing Confirmed by KOHUT  MD, STEPHEN ([id number]) on  03/07/2016 1:25:34 PM      MDM   Final diagnoses:  Chest pain, unspecified chest pain type   30 year old male here with generalized chest pain, onset last night rest. Reports some associated shortness of breath. Patient is afebrile, nontoxic. His vital signs are stable. EKG with early repolarization, no acute ischemia.  Lab work reassuring, trop negative.  CXR is clear.  Patient's pain is reproducible on exam. He has no signs of trauma, no concern for underlying internal injuries. Has a low risk heart score of 1 due to his RF (HTN, smoking).  PERC negative.  At this time, low suspicion for ACS, PE, dissection, or other acute cardiac event. Feel patient stable for discharge with PCP follow-up.  Discussed plan with patient, he/she acknowledged understanding and agreed with plan of care.  Return precautions given for new or worsening symptoms.  Garlon HatchetLisa M Mercy Leppla, PA-C 03/07/16 1359  Raeford RazorStephen Kohut, MD 03/08/16 [phone]

## 2016-03-14 ENCOUNTER — Encounter: Payer: Self-pay | Admitting: Physician Assistant

## 2016-03-14 ENCOUNTER — Ambulatory Visit (INDEPENDENT_AMBULATORY_CARE_PROVIDER_SITE_OTHER): Payer: BLUE CROSS/BLUE SHIELD | Admitting: Physician Assistant

## 2016-03-14 VITALS — BP 122/76 | HR 75 | Temp 97.7°F | Resp 16 | Ht 71.5 in | Wt 332.8 lb

## 2016-03-14 DIAGNOSIS — Z79899 Other long term (current) drug therapy: Secondary | ICD-10-CM | POA: Diagnosis not present

## 2016-03-14 DIAGNOSIS — Z23 Encounter for immunization: Secondary | ICD-10-CM

## 2016-03-14 DIAGNOSIS — D649 Anemia, unspecified: Secondary | ICD-10-CM

## 2016-03-14 DIAGNOSIS — E559 Vitamin D deficiency, unspecified: Secondary | ICD-10-CM | POA: Diagnosis not present

## 2016-03-14 DIAGNOSIS — Z91199 Patient's noncompliance with other medical treatment and regimen due to unspecified reason: Secondary | ICD-10-CM

## 2016-03-14 DIAGNOSIS — Z136 Encounter for screening for cardiovascular disorders: Secondary | ICD-10-CM | POA: Diagnosis not present

## 2016-03-14 DIAGNOSIS — I10 Essential (primary) hypertension: Secondary | ICD-10-CM

## 2016-03-14 DIAGNOSIS — R7303 Prediabetes: Secondary | ICD-10-CM

## 2016-03-14 DIAGNOSIS — Z9119 Patient's noncompliance with other medical treatment and regimen: Secondary | ICD-10-CM

## 2016-03-14 DIAGNOSIS — Z0000 Encounter for general adult medical examination without abnormal findings: Secondary | ICD-10-CM | POA: Diagnosis not present

## 2016-03-14 DIAGNOSIS — E039 Hypothyroidism, unspecified: Secondary | ICD-10-CM

## 2016-03-14 DIAGNOSIS — R079 Chest pain, unspecified: Secondary | ICD-10-CM

## 2016-03-14 DIAGNOSIS — Z0001 Encounter for general adult medical examination with abnormal findings: Secondary | ICD-10-CM

## 2016-03-14 DIAGNOSIS — E785 Hyperlipidemia, unspecified: Secondary | ICD-10-CM

## 2016-03-14 LAB — CBC WITH DIFFERENTIAL/PLATELET
BASOS PCT: 1 %
Basophils Absolute: 57 cells/uL (ref 0–200)
Eosinophils Absolute: 114 cells/uL (ref 15–500)
Eosinophils Relative: 2 %
HEMATOCRIT: 43.2 % (ref 38.5–50.0)
Hemoglobin: 14.8 g/dL (ref 13.2–17.1)
LYMPHS ABS: 2565 {cells}/uL (ref 850–3900)
LYMPHS PCT: 45 %
MCH: 29.5 pg (ref 27.0–33.0)
MCHC: 34.3 g/dL (ref 32.0–36.0)
MCV: 86.1 fL (ref 80.0–100.0)
MONO ABS: 399 {cells}/uL (ref 200–950)
MPV: 9.4 fL (ref 7.5–12.5)
Monocytes Relative: 7 %
Neutro Abs: 2565 cells/uL (ref 1500–7800)
Neutrophils Relative %: 45 %
Platelets: 308 10*3/uL (ref 140–400)
RBC: 5.02 MIL/uL (ref 4.20–5.80)
RDW: 14.9 % (ref 11.0–15.0)
WBC: 5.7 10*3/uL (ref 3.8–10.8)

## 2016-03-14 LAB — TROPONIN I

## 2016-03-14 LAB — FERRITIN: FERRITIN: 76 ng/mL (ref 20–345)

## 2016-03-14 LAB — TSH: TSH: 7.03 mIU/L — ABNORMAL HIGH (ref 0.40–4.50)

## 2016-03-14 LAB — D-DIMER, QUANTITATIVE (NOT AT ARMC): D DIMER QUANT: 0.3 ug{FEU}/mL (ref <0.50)

## 2016-03-14 MED ORDER — LEVOTHYROXINE SODIUM 75 MCG PO TABS: 75.0000 ug | ORAL_TABLET | Freq: Every day | ORAL | Status: DC

## 2016-03-14 NOTE — Progress Notes (Signed)
Complete Physical  Assessment and Plan: 1. Prediabetes Discussed general issues about diabetes pathophysiology and management., Educational material distributed., Suggested low cholesterol diet., Encouraged aerobic exercise., Discussed foot care., Reminded to get yearly retinal exam. - Hemoglobin A1c - Insulin, fasting  2. Morbid obesity Obesity with co morbidities- long discussion about weight loss, diet, and exercise - Testosterone  3. Hyperlipidemia -continue medications, check lipids, decrease fatty foods, increase activity.  - atorvastatin (LIPITOR) 20 MG tablet; Take 1 tablet (20 mg total) by mouth daily.  Dispense: 90 tablet; Refill: 0 - Lipid panel  4. Hypothyroidism, unspecified hypothyroidism type Hypothyroidism-check TSH level, will put in refill of medication after results, reminded to take on an empty stomach 30-74mins before food.  - TSH  5. Essential hypertension - CBC with Differential/Platelet - BASIC METABOLIC PANEL WITH GFR - Hepatic function panel - Urinalysis, Routine w reflex microscopic (not at [facility]) - Microalbumin / creatinine urine ratio  6. Vitamin D deficiency - Vit D  25 hydroxy (rtn osteoporosis monitoring)  7. Medication management - Magnesium  8. Routine general medical examination at a health care facility Discussed STD testing, safe sex, alcohol and drug awareness, drinking and driving dangers, wearing a seat belt and general safety measures for young adult.  9. Noncompliance Noncompliance- emphasized compliance with medications and appointments, patient expressed understanding of risks.   10. Chest pain, atypical Will get Ddimer low risk PE, ? costochondritis versus pleuritic/COPD- CXR negative- get on allergy pill and mobic and zantac  11. Anemia, unspecified anemia type - Iron and TIBC - Ferritin  12. Need for Tdap vaccination - Tdap vaccine greater than or equal to 7yo IM  Discussed med's effects and SE's. Screening labs and tests  as requested with regular follow-up as recommended.  HPI 30 y.o. male  presents for a complete physical. He has a history of noncompliance for appointments in the past and has not been taking his medications for 3-6 months.  His blood pressure has been controlled at home, today their BP is BP: 122/76 mmHg He does workout but works hard at work at [employer], has 2 little girls, no longer on probation. He denies dizziness.   Patient has had chest pain and SOB, went to ER 06/08. States he was sitting and watching a game, sharp nonexertional pain across chest, radiation to neck with SOB, some wheezing, with some diaphroesis, worse with deep breath, lasted from that night to next morning and went to ER. Had normal EKG, normal CXR.  Had repeat CP on Monday, sharp, nonexertional, with some SOB, some wheezing, lasted 20 mins.   He also admits to being anxious/depressed from court hearings, has been thinking a lot, no thought of SI/HI.  He is on cholesterol medication, he is out of his medications and denies myalgias. His cholesterol is at goal. The cholesterol last visit was:   Lab Results  Component Value Date   CHOL 204* 03/13/2015   HDL 38* 03/13/2015   LDLCALC 135* 03/13/2015   TRIG 156* 03/13/2015   CHOLHDL 5.4 03/13/2015   He has been working on diet and exercise for prediabetes, and denies paresthesia of the feet, polydipsia and polyuria. Last A1C in the office was:  Lab Results  Component Value Date   HGBA1C 5.7* 03/13/2015   Patient is on Vitamin D supplement. Lab Results  Component Value Date   VD25OH 10* 03/13/2015   He is on thyroid medication. He is on 1.5 T/T and 1 pill the rest of the days, however he  has been out of it for over 6 months. Patient denies nervousness, palpitations and weight changes.  Lab Results  Component Value Date   TSH 8.062* 03/13/2015   BMI is Body mass index is 45.77 kg/(m^2)., he is working on diet and exercise. Wt Readings from Last 3 Encounters:   03/14/16 332 lb 12.8 oz (150.957 kg)  03/07/16 266 lb (120.657 kg)  03/13/15 324 lb (146.965 kg)     Current Medications:  Current Outpatient Prescriptions on File Prior to Visit  Medication Sig Dispense Refill  . levothyroxine (SYNTHROID) 75 MCG tablet Take 1 tablet (75 mcg total) by mouth daily. 90 tablet 0  . naproxen (NAPROSYN) 500 MG tablet Take 1 tablet (500 mg total) by mouth 2 (two) times daily with a meal. 30 tablet 0  . atorvastatin (LIPITOR) 20 MG tablet Take 1 tablet (20 mg total) by mouth daily. (Patient not taking: Reported on 03/07/2016) 90 tablet 0   No current facility-administered medications on file prior to visit.   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 Hypothyroid   . Vitamin D deficiency   . Hyperlipidemia    Surgical History: No past surgical history on file. Family History: No family history on file. Social History:   Social History  Substance Use Topics  . Smoking status: Current Every Day Smoker -- 0.25 packs/day for 1 years    Last Attempt to Quit: 03/09/2013  . Smokeless tobacco: Never Used  . Alcohol Use: No   Review of Systems  Constitutional: Negative.   HENT: Negative.   Eyes: Negative.   Respiratory: Positive for shortness of breath. Negative for cough, hemoptysis, sputum production and wheezing.   Cardiovascular: Positive for chest pain. Negative for palpitations, orthopnea, claudication, leg swelling and PND.  Gastrointestinal: Negative.   Genitourinary: Negative.   Musculoskeletal: Negative for myalgias, back pain, joint pain, falls and neck pain.  Skin: Negative.    Neurological: Negative.   Endo/Heme/Allergies: Negative.   Psychiatric/Behavioral: Negative.     Physical Exam: Estimated body mass index is 45.77 kg/(m^2) as calculated from the following:   Height as of this encounter: 5' 11.5" (1.816 m).   Weight as of this encounter: 332 lb 12.8 oz (150.957 kg). BP 122/76 mmHg  Pulse 75  Temp(Src) 97.7 F (36.5 C) (Temporal)  Resp 16  Ht 5' 11.5" (1.816 m)  Wt 332 lb 12.8 oz (150.957 kg)  BMI 45.77 kg/m2  SpO2 97% General Appearance: Well nourished, in no apparent distress. Eyes: PERRLA, EOMs, conjunctiva no swelling or erythema, normal fundi and vessels. Sinuses: No Frontal/maxillary tenderness ENT/Mouth: Ext aud canals clear, normal light reflex with TMs without erythema, bulging. Good dentition. No erythema, swelling, or exudate on post pharynx. Tonsils not swollen or erythematous. Hearing normal.  Neck: Supple, thyroid normal. No bruits Respiratory: Respiratory effort normal, BS equal bilaterally without rales, rhonchi, wheezing or stridor. Cardio: RRR with occ PVCs without murmurs, rubs or gallops. Brisk peripheral pulses without edema.  Chest: symmetric, with normal excursions and percussion. Abdomen: Soft, +BS, obese, Non tender, no guarding, rebound, hernias, masses, or organomegaly. .  Lymphatics: Non tender without lymphadenopathy.  Genitourinary: defer Musculoskeletal: Full ROM all  peripheral extremities,5/5 strength, and normal gait.  Skin: Warm, dry without rashes, lesions, ecchymosis.  Neuro: Cranial nerves intact, reflexes equal bilaterally. Normal muscle tone, no cerebellar symptoms. Sensation intact.  Psych: Awake and oriented X 3, normal affect, Insight and Judgment appropriate.   David MullingAmanda Jarrette Tanner 10:21 AM

## 2016-03-14 NOTE — Patient Instructions (Addendum)
Get on zantac/ratinidine  twice a day for 2 weeks Take aleve once a day for 2 weeks Get on claritin at night for 2 weeks  Get on your thyroid medication daily, 1 hour before food or drink other than water  YOU NEED TO FOLLOW UP WITH [place]  KEEP YOUR APPOINTMENTS  Costochondritis Costochondritis is a condition in which the tissue (cartilage) that connects your ribs with your breastbone (sternum) becomes irritated. It causes pain in the chest and rib area. It usually goes away on its own over time. HOME CARE  Avoid activities that wear you out.  Do not strain your ribs. Avoid activities that use your:  Chest.  Belly.  Side muscles.  Put ice on the area for the first 2 days after the pain starts.  Put ice in a plastic bag.  Place a towel between your skin and the bag.  Leave the ice on for 20 minutes, 2-3 times a day.  Only take medicine as told by your doctor. GET HELP IF:  You have redness or puffiness (swelling) in the rib area.  Your pain does not go away with rest or medicine. GET HELP RIGHT AWAY IF:   Your pain gets worse.  You are very uncomfortable.  You have trouble breathing.  You cough up blood.  You start sweating or throwing up (vomiting).  You have a fever or lasting symptoms for more than 2-3 days.  You have a fever and your symptoms suddenly get worse. MAKE SURE YOU:   Understand these instructions.  Will watch your condition.  Will get help right away if you are not doing well or get worse.   This information is not intended to replace advice given to you by your health care provider. Make sure you discuss any questions you have with your health care provider.   Document Released: 03/04/2008 Document Revised: 05/19/2013 Document Reviewed: 04/20/2013 Elsevier Interactive Patient Education 2016 [employer].   Hypothyroidism Hypothyroidism is a disorder of the thyroid. The thyroid is a large gland that is located in the lower front of  the neck. The thyroid releases hormones that control how the body works. With hypothyroidism, the thyroid does not make enough of these hormones. CAUSES Causes of hypothyroidism may include:  Viral infections.  Pregnancy.  Your own defense system (immune system) attacking your thyroid.  Certain medicines.  Birth defects.  Past radiation treatments to your head or neck.  Past treatment with radioactive iodine.  Past surgical removal of part or all of your thyroid.  Problems with the gland that is located in the center of your brain (pituitary). SIGNS AND SYMPTOMS Signs and symptoms of hypothyroidism may include:  Feeling as though you have no energy (lethargy).  Inability to tolerate cold.  Weight gain that is not explained by a change in diet or exercise habits.  Dry skin.  Coarse hair.  Menstrual irregularity.  Slowing of thought processes.  Constipation.  Sadness or depression. DIAGNOSIS  Your health care provider may diagnose hypothyroidism with blood tests and ultrasound tests. TREATMENT Hypothyroidism is treated with medicine that replaces the hormones that your body does not make. After you begin treatment, it may take several weeks for symptoms to go away. HOME CARE INSTRUCTIONS   Take medicines only as directed by your health care provider.  If you start taking any new medicines, tell your health care provider.  Keep all follow-up visits as directed by your health care provider. This is important. As your condition improves,  your dosage needs may change. You will need to have blood tests regularly so that your health care provider can watch your condition. SEEK MEDICAL CARE IF:  Your symptoms do not get better with treatment.  You are taking thyroid replacement medicine and:  You sweat excessively.  You have tremors.  You feel anxious.  You lose weight rapidly.  You cannot tolerate heat.  You have emotional swings.  You have  diarrhea.  You feel weak. SEEK IMMEDIATE MEDICAL CARE IF:   You develop chest pain.  You develop an irregular heartbeat.  You develop a rapid heartbeat.   This information is not intended to replace advice given to you by your health care provider. Make sure you discuss any questions you have with your health care provider.   Document Released: 09/16/2005 Document Revised: 10/07/2014 Document Reviewed: 02/01/2014 Elsevier Interactive Patient Education 2016 Elsevier Inc.   Chest Wall Pain Chest wall pain is pain in or around the bones and muscles of your chest. Sometimes, an injury causes this pain. Sometimes, the cause may not be known. This pain may take several weeks or longer to get better. HOME CARE INSTRUCTIONS  Pay attention to any changes in your symptoms. Take these actions to help with your pain:   Rest as told by your health care provider.   Avoid activities that cause pain. These include any activities that use your chest muscles or your abdominal and side muscles to lift heavy items.   If directed, apply ice to the painful area:  Put ice in a plastic bag.  Place a towel between your skin and the bag.  Leave the ice on for 20 minutes, 2-3 times per day.  Take over-the-counter and prescription medicines only as told by your health care provider.  Do not use tobacco products, including cigarettes, chewing tobacco, and e-cigarettes. If you need help quitting, ask your health care provider.  Keep all follow-up visits as told by your health care provider. This is important. SEEK MEDICAL CARE IF:  You have a fever.  Your chest pain becomes worse.  You have new symptoms. SEEK IMMEDIATE MEDICAL CARE IF:  You have nausea or vomiting.  You feel sweaty or light-headed.  You have a cough with phlegm (sputum) or you cough up blood.  You develop shortness of breath.   This information is not intended to replace advice given to you by your health care provider.  Make sure you discuss any questions you have with your health care provider.   Document Released: 09/16/2005 Document Revised: 06/07/2015 Document Reviewed: 12/12/2014 Elsevier Interactive Patient Education [employer]2016 Elsevier Inc.

## 2016-03-15 ENCOUNTER — Other Ambulatory Visit: Payer: Self-pay | Admitting: Physician Assistant

## 2016-03-15 DIAGNOSIS — E039 Hypothyroidism, unspecified: Secondary | ICD-10-CM

## 2016-03-15 LAB — LIPID PANEL
CHOL/HDL RATIO: 5 ratio (ref <=5.0)
Cholesterol: 200 mg/dL (ref 125–200)
HDL: 40 mg/dL (ref >=40)
LDL CALC: 130 mg/dL — AB (ref <130)
TRIGLYCERIDES: 148 mg/dL (ref <150)
VLDL: 30 mg/dL (ref <30)

## 2016-03-15 LAB — URINALYSIS, ROUTINE W REFLEX MICROSCOPIC

## 2016-03-15 LAB — BASIC METABOLIC PANEL WITH GFR
BUN: 9 mg/dL (ref 7–25)
CO2: 25 mmol/L (ref 20–31)
Calcium: 9.4 mg/dL (ref 8.6–10.3)
Chloride: 107 mmol/L (ref 98–110)
Creat: 1.35 mg/dL (ref 0.60–1.35)
GFR, EST AFRICAN AMERICAN: 82 mL/min (ref >=60)
GFR, EST NON AFRICAN AMERICAN: 71 mL/min (ref >=60)
GLUCOSE: 67 mg/dL (ref 65–99)
POTASSIUM: 4.3 mmol/L (ref 3.5–5.3)
Sodium: 141 mmol/L (ref 135–146)

## 2016-03-15 LAB — HEPATIC FUNCTION PANEL
ALK PHOS: 45 U/L (ref 40–115)
ALT: 29 U/L (ref 9–46)
AST: 24 U/L (ref 10–40)
Albumin: 4.4 g/dL (ref 3.6–5.1)
BILIRUBIN INDIRECT: 0.3 mg/dL (ref 0.2–1.2)
BILIRUBIN TOTAL: 0.4 mg/dL (ref 0.2–1.2)
Bilirubin, Direct: 0.1 mg/dL (ref <=0.2)
TOTAL PROTEIN: 6.8 g/dL (ref 6.1–8.1)

## 2016-03-15 LAB — IRON AND TIBC
%SAT: 16 % (ref 15–60)
Iron: 56 ug/dL (ref 50–195)
TIBC: 348 ug/dL (ref 250–425)
UIBC: 292 ug/dL (ref 125–400)

## 2016-03-15 LAB — MICROALBUMIN / CREATININE URINE RATIO

## 2016-03-15 LAB — MAGNESIUM: Magnesium: 1.9 mg/dL (ref 1.5–2.5)

## 2016-03-15 LAB — VITAMIN D 25 HYDROXY (VIT D DEFICIENCY, FRACTURES): Vit D, 25-Hydroxy: 13 ng/mL — ABNORMAL LOW (ref 30–100)

## 2016-03-15 LAB — HEMOGLOBIN A1C
Hgb A1c MFr Bld: 5.8 % — ABNORMAL HIGH (ref <5.7)
Mean Plasma Glucose: 120 mg/dL

## 2016-03-24 ENCOUNTER — Emergency Department (HOSPITAL_COMMUNITY)
Admission: EM | Admit: 2016-03-24 | Discharge: 2016-03-24 | Disposition: A | Discharge status: Home / Self Care | Payer: BLUE CROSS/BLUE SHIELD | Attending: Emergency Medicine | Admitting: Emergency Medicine

## 2016-03-24 ENCOUNTER — Encounter (HOSPITAL_COMMUNITY): Payer: Self-pay | Admitting: *Deleted

## 2016-03-24 ENCOUNTER — Emergency Department (HOSPITAL_COMMUNITY): Payer: BLUE CROSS/BLUE SHIELD

## 2016-03-24 DIAGNOSIS — Y9367 Activity, basketball: Secondary | ICD-10-CM | POA: Insufficient documentation

## 2016-03-24 DIAGNOSIS — F17210 Nicotine dependence, cigarettes, uncomplicated: Secondary | ICD-10-CM | POA: Diagnosis not present

## 2016-03-24 DIAGNOSIS — E785 Hyperlipidemia, unspecified: Secondary | ICD-10-CM | POA: Diagnosis not present

## 2016-03-24 DIAGNOSIS — Y929 Unspecified place or not applicable: Secondary | ICD-10-CM | POA: Diagnosis not present

## 2016-03-24 DIAGNOSIS — Y999 Unspecified external cause status: Secondary | ICD-10-CM | POA: Diagnosis not present

## 2016-03-24 DIAGNOSIS — E039 Hypothyroidism, unspecified: Secondary | ICD-10-CM | POA: Insufficient documentation

## 2016-03-24 DIAGNOSIS — X501XXA Overexertion from prolonged static or awkward postures, initial encounter: Secondary | ICD-10-CM | POA: Diagnosis not present

## 2016-03-24 DIAGNOSIS — S99912A Unspecified injury of left ankle, initial encounter: Secondary | ICD-10-CM | POA: Diagnosis present

## 2016-03-24 DIAGNOSIS — S93402A Sprain of unspecified ligament of left ankle, initial encounter: Secondary | ICD-10-CM | POA: Diagnosis not present

## 2016-03-24 MED ORDER — NAPROXEN 500 MG PO TABS
500.0000 mg | ORAL_TABLET | Freq: Two times a day (BID) | ORAL | Status: DC
Start: 2016-03-24 — End: 2016-07-23

## 2016-03-24 NOTE — ED Provider Notes (Signed)
CSN: [id number]     Arrival date & time 03/24/16  2007 History  By signing my name below, I, David Tanner, attest that this documentation has been prepared under the direction and in the presence of David FarrierWilliam Juelle Dickmann, PA-C. Electronically Signed: Bridgette HabermannMaria Tanner, ED Scribe. 03/24/2016. 9:20 PM.   Chief Complaint  Patient presents with  . Ankle Injury    The history is provided by the patient. No language interpreter was used.    HPI Comments: David Tanner is a 30 y.o. male who presents to the Emergency Department complaining of 10/10 sudden onset left ankle pain onset today. Pt states he was playing basketball when he heard a pop in his left ankle. Patient believes he may have twisted it. He is unsure which way. Patient reports pain is exacerbated when he bares weight on it. Pt has not tried any alleviating factors. Patient denies any additional injuries. Patient also denies knee pain, numbness, weakness, paresthesia.   Past Medical History  Diagnosis Date  . Hypothyroid   . Vitamin D deficiency   . Hyperlipidemia    History reviewed. No pertinent past surgical history. Family History  Problem Relation Age of Onset  . Hypertension Mother   . Cancer Maternal Grandfather     leukemia   Social History  Substance Use Topics  . Smoking status: Current Every Day Smoker -- 0.25 packs/day for 1 years    Types: Cigarettes  . Smokeless tobacco: Never Used  . Alcohol Use: No    Review of Systems  Constitutional: Negative for fever.  Cardiovascular: Negative for chest pain.  Gastrointestinal: Negative for nausea.  Musculoskeletal: Positive for joint swelling and arthralgias.  Skin: Negative for rash and wound.  Neurological: Negative for weakness and numbness.     Allergies  Review of patient's allergies indicates no known allergies.  Home Medications   Prior to Admission medications   Medication Sig Start Date End Date Taking? Authorizing Provider  atorvastatin (LIPITOR) 20 MG tablet  Take 1 tablet (20 mg total) by mouth daily. Patient not taking: Reported on 03/07/2016 03/13/15 03/12/16  Quentin MullingAmanda Collier, PA-C  levothyroxine (SYNTHROID) 75 MCG tablet Take 1 tablet (75 mcg total) by mouth daily. 03/14/16 03/14/17  Quentin MullingAmanda Collier, PA-C  naproxen (NAPROSYN) 500 MG tablet Take 1 tablet (500 mg total) by mouth 2 (two) times daily with a meal. 03/24/16   David FarrierWilliam Adan Baehr, PA-C   BP 115/80 mmHg  Pulse 91  Temp(Src) 98.6 F (37 C) (Oral)  Resp 18  SpO2 99% Physical Exam  Constitutional: He is oriented to person, place, and time. He appears well-developed and well-nourished. No distress.  Nontoxic-appearing obese male.  HENT:  Head: Normocephalic and atraumatic.  Eyes: Conjunctivae are normal. Pupils are equal, round, and reactive to light. Right eye exhibits no discharge. Left eye exhibits no discharge.  Neck: Neck supple.  Cardiovascular: Normal rate, regular rhythm, normal heart sounds and intact distal pulses.   Bilateral dorsalis pedis and posterior tibialis pulses are intact.  Pulmonary/Chest: Effort normal and breath sounds normal. No respiratory distress.  Abdominal: Soft. There is no tenderness.  Musculoskeletal: He exhibits edema and tenderness.  Tenderness and soft tissue edema to the lateral aspect of his left ankle. No ecchymosis or deformity. Able to plantar and dorsiflex. No ankle instability noted.   Lymphadenopathy:    He has no cervical adenopathy.  Neurological: He is alert and oriented to person, place, and time. Coordination normal.  Good sensation to bilateral distal toes.   Skin: Skin  is warm and dry. No rash noted. He is not diaphoretic. No erythema. No pallor.  Psychiatric: He has a normal mood and affect. His behavior is normal.  Nursing note and vitals reviewed.   ED Course  Procedures  DIAGNOSTIC STUDIES: Oxygen Saturation is 99% on RA, normal by my interpretation.    COORDINATION OF CARE: 9:16 PM Discussed treatment plan with pt at bedside which  includes splint, Rx of pain medication and orthopedic surgeon referral and pt agreed to plan.  Imaging Review Dg Ankle Complete Left  03/24/2016  CLINICAL DATA:  Twisted left ankle playing basketball tonight. EXAM: LEFT ANKLE COMPLETE - 3+ VIEW COMPARISON:  None. FINDINGS: Extensive lateral soft tissue swelling. The ankle mortise is maintained. No acute fracture. No osteochondral lesion. The mid and hindfoot bony structures are intact. IMPRESSION: No acute bony findings. Electronically Signed   By: Rudie MeyerP.  Gallerani M.D.   On: 03/24/2016 21:04   I have personally reviewed and evaluated these images as part of my medical decision-making.   MDM   Meds given in ED:  Medications - No data to display  New Prescriptions   NAPROXEN (NAPROSYN) 500 MG TABLET    Take 1 tablet (500 mg total) by mouth 2 (two) times daily with a meal.    Final diagnoses:  Left ankle sprain, initial encounter   This is a 30 y.o. male who presents to the Emergency Department complaining of 10/10 sudden onset left ankle pain onset today. Pt states he was playing basketball when he heard a pop in his left ankle. Patient believes he may have twisted it. On examination the patient is afebrile and nontoxic appearing. He has tenderness and soft tissue edema noted to the lateral aspect of his left ankle. He is neurovascularly intact. Left ankle x-ray shows no acute bony findings. Patient with ankle sprain. Will place the patient on aerosol ankle lace up splint and provided with crutches. We'll have him nonweightbearing follow-up with orthopedic surgery. I advised the patient to follow-up with their primary care provider this week. I advised the patient to return to the emergency department with new or worsening symptoms or new concerns. The patient verbalized understanding and agreement with plan.   I personally performed the services described in this documentation, which was scribed in my presence. The recorded information has been  reviewed and is accurate.     David FarrierWilliam Mikena Masoner, PA-C 03/24/16 2127  Lyndal Pulleyaniel Knott, MD 03/25/16 [phone]

## 2016-03-24 NOTE — ED Notes (Signed)
PT DISCHARGED. INSTRUCTIONS AND PRESCRIPTION GIVEN. AAOX4. PT IN NO APPARENT DISTRESS. THE OPPORTUNITY TO ASK QUESTIONS WAS PROVIDED. 

## 2016-03-24 NOTE — ED Notes (Signed)
Pt complains of left ankle pain since twisting his ankle while playing basketball.. Pt has swelling to left lateral ankle.

## 2016-03-24 NOTE — Discharge Instructions (Signed)
Ankle Sprain °An ankle sprain is an injury to the strong, fibrous tissues (ligaments) that hold the bones of your ankle joint together.  °CAUSES °An ankle sprain is usually caused by a fall or by twisting your ankle. Ankle sprains most commonly occur when you step on the outer edge of your foot, and your ankle turns inward. People who participate in sports are more prone to these types of injuries.  °SYMPTOMS  °· Pain in your ankle. The pain may be present at rest or only when you are trying to stand or walk. °· Swelling. °· Bruising. Bruising may develop immediately or within 1 to 2 days after your injury. °· Difficulty standing or walking, particularly when turning corners or changing directions. °DIAGNOSIS  °Your caregiver will ask you details about your injury and perform a physical exam of your ankle to determine if you have an ankle sprain. During the physical exam, your caregiver will press on and apply pressure to specific areas of your foot and ankle. Your caregiver will try to move your ankle in certain ways. An X-ray exam may be done to be sure a bone was not broken or a ligament did not separate from one of the bones in your ankle (avulsion fracture).  °TREATMENT  °Certain types of braces can help stabilize your ankle. Your caregiver can make a recommendation for this. Your caregiver may recommend the use of medicine for pain. If your sprain is severe, your caregiver may refer you to a surgeon who helps to restore function to parts of your skeletal system (orthopedist) or a physical therapist. °HOME CARE INSTRUCTIONS  °1. Apply ice to your injury for 1-2 days or as directed by your caregiver. Applying ice helps to reduce inflammation and pain. °1. Put ice in a plastic bag. °2. Place a towel between your skin and the bag. °3. Leave the ice on for 15-20 minutes at a time, every 2 hours while you are awake. °2. Only take over-the-counter or prescription medicines for pain, discomfort, or fever as directed  by your caregiver. °3. Elevate your injured ankle above the level of your heart as much as possible for 2-3 days. °4. If your caregiver recommends crutches, use them as instructed. Gradually put weight on the affected ankle. Continue to use crutches or a cane until you can walk without feeling pain in your ankle. °5. If you have a plaster splint, wear the splint as directed by your caregiver. Do not rest it on anything harder than a pillow for the first 24 hours. Do not put weight on it. Do not get it wet. You may take it off to take a shower or bath. °6. You may have been given an elastic bandage to wear around your ankle to provide support. If the elastic bandage is too tight (you have numbness or tingling in your foot or your foot becomes cold and blue), adjust the bandage to make it comfortable. °7. If you have an air splint, you may blow more air into it or let air out to make it more comfortable. You may take your splint off at night and before taking a shower or bath. Wiggle your toes in the splint several times per day to decrease swelling. °SEEK MEDICAL CARE IF:  °1. You have rapidly increasing bruising or swelling. °2. Your toes feel extremely cold or you lose feeling in your foot. °3. Your pain is not relieved with medicine. °SEEK IMMEDIATE MEDICAL CARE IF: °1. Your toes are numb or blue. °  2. You have severe pain that is increasing. MAKE SURE YOU:  1. Understand these instructions. 2. Will watch your condition. 3. Will get help right away if you are not doing well or get worse.   This information is not intended to replace advice given to you by your health care provider. Make sure you discuss any questions you have with your health care provider.   Document Released: 09/16/2005 Document Revised: 10/07/2014 Document Reviewed: 09/28/2011 Elsevier Interactive Patient Education 2016 Elsevier Inc. Stirrup Ankle Brace Stirrup ankle braces give support and help stabilize the ankle joint. They are  rigid pieces of plastic or fiberglass that go up both sides of the lower leg with the bottom of the stirrup fitting comfortably under the bottom of the instep of the foot. It can be held on with Velcro straps or an elastic wrap. Stirrup ankle braces are used to support the ankle following mild or moderate sprains or strains, or fractures after cast removal.  They can be easily removed or adjusted if there is swelling. The rigid brace shells are designed to fit the ankle comfortably and provide the needed medial/lateral stabilization. This brace can be easily worn with most athletic shoes. The brace liner is usually made of a soft, comfortable gel-like material. This gel fits the ankle well without causing uncomfortable pressure points.  IMPORTANCE OF ANKLE BRACES:  The use of ankle bracing is effective in the prevention of ankle sprains.  In athletes, the use of ankle bracing will offer protection and prevent further sprains.  Research shows that a complete rehabilitation program needs to be included with external bracing. This includes range of motion and ankle strengthening exercises. Your caregivers will instruct you in this. If you were given the brace today for a new injury, use the following home care instructions as a guide. HOME CARE INSTRUCTIONS  8. Apply ice to the sore area for 15-20 minutes, 03-04 times per day while awake for the first 2 days. Put the ice in a plastic bag and place a towel between the bag of ice and your skin. Never place the ice pack directly on your skin. Be especially careful using ice on an elbow or knee or other bony area, such as your ankle, because icing for too long may damage the nerves which are close to the surface. 9. Keep your leg elevated when possible to lessen swelling. 10. Wear your splint until you are seen for a follow-up examination. Do not put weight on it. Do not get it wet. You may take it off to take a shower or bath. 11. For Activity: Use crutches  with non-weight bearing for 1 week. Then, you may walk on your ankle as instructed. Start gradually with weight bearing on the affected ankle. 12. Continue to use crutches or a cane until you can stand on your ankle without causing pain. 13. Wiggle your toes in the splint several times per day if you are able. 14. The splint is too tight if you have numbness, tingling, or if your foot becomes cold and blue. Adjust the straps or elastic bandage to make it comfortable. 15. Only take over-the-counter or prescription medicines for pain, discomfort, or fever as directed by your caregiver. SEEK IMMEDIATE MEDICAL CARE IF:  4. You have increased bruising, swelling or pain. 5. Your toes are blue or cold and loosening the brace or wrap does not help. 6. Your pain is not relieved with medicine. MAKE SURE YOU:  3. Understand these instructions. 4.  Will watch your condition. 5. Will get help right away if you are not doing well or get worse.   This information is not intended to replace advice given to you by your health care provider. Make sure you discuss any questions you have with your health care provider.   Document Released: 07/17/2004 Document Revised: 12/09/2011 Document Reviewed: 04/18/2015 Elsevier Interactive Patient Education 2016 [employer] Crutch Use Crutches are used to take weight off one of your legs or feet when you stand or walk. It is important to use crutches that fit properly. When fitted properly:  Each crutch should be 2-3 finger widths below the armpit.  Your weight should be supported by your hand, and not by resting the armpit on the crutch. RISKS AND COMPLICATIONS Damage to the nerves that extend from your armpit to your hand and arm. To prevent this from happening, make sure your crutches fit properly and do not put pressure on your armpit when using them. HOW TO USE YOUR CRUTCHES If you have been instructed to use partial weight bearing, apply (bear) the amount of  weight as your health care provider suggests. Do not bear weight in an amount that causes pain to the area of injury. Walking 16. Step with the crutches. 17. Swing the healthy leg slightly ahead of the crutches. Going Up Steps If there is no handrail: 7. Step up with the healthy leg. 8. Step up with the crutches and injured leg. 9. Continue in this way. If there is a handrail: 6. Hold both crutches in one hand. 7. Place your free hand on the handrail. 8. While putting your weight on your arms, lift your healthy leg to the step. 9. Bring the crutches and the injured leg up to that step. 10. Continue in this way. Going Down Steps Be very careful, as going down stairs with crutches is very challenging. If there is no handrail: 4. Step down with the injured leg and crutches. 5. Step down with the healthy leg. If there is a handrail: 1. Place your hand on the handrail. 2. Hold both crutches with your free hand. 3. Lower your injured leg and crutch to the step below you. Make sure to keep the crutch tips in the center of the step, never on the edge. 4. Lower your healthy leg to that step. 5. Continue in this way. Standing Up 1. Hold the injured leg forward. 2. Grab the armrest with one hand and the top of the crutches with the other hand. 3. Using these supports, pull yourself up to a standing position. Sitting Down 1. Hold the injured leg forward. 2. Grab the armrest with one hand and the top of the crutches with the other hand. 3. Lower yourself to a sitting position. SEEK MEDICAL CARE IF:  You still feel unsteady on your feet.  You develop new pain, for example in your armpits, back, shoulder, wrist, or hip.  You develop any numbness or tingling. SEEK IMMEDIATE MEDICAL CARE IF:  You fall.   This information is not intended to replace advice given to you by your health care provider. Make sure you discuss any questions you have with your health care provider.   Document  Released: 09/13/2000 Document Revised: 10/07/2014 Document Reviewed: 05/24/2013 Elsevier Interactive Patient Education [employer]

## 2016-04-16 ENCOUNTER — Other Ambulatory Visit: Payer: BLUE CROSS/BLUE SHIELD

## 2016-04-16 DIAGNOSIS — E039 Hypothyroidism, unspecified: Secondary | ICD-10-CM

## 2016-04-17 LAB — TSH: TSH: 10.38 m[IU]/L — AB (ref 0.40–4.50)

## 2016-04-22 ENCOUNTER — Inpatient Hospital Stay (HOSPITAL_COMMUNITY): Admission: RE | Admit: 2016-04-22 | Payer: Self-pay | Source: Ambulatory Visit

## 2016-04-22 ENCOUNTER — Ambulatory Visit (HOSPITAL_COMMUNITY)
Admission: RE | Admit: 2016-04-22 | Discharge: 2016-04-22 | Disposition: A | Discharge status: Home / Self Care | Payer: BLUE CROSS/BLUE SHIELD | Source: Ambulatory Visit | Attending: Sports Medicine | Admitting: Sports Medicine

## 2016-04-22 ENCOUNTER — Other Ambulatory Visit (HOSPITAL_COMMUNITY): Payer: Self-pay | Admitting: Sports Medicine

## 2016-04-22 DIAGNOSIS — I824Z2 Acute embolism and thrombosis of unspecified deep veins of left distal lower extremity: Secondary | ICD-10-CM | POA: Insufficient documentation

## 2016-04-22 DIAGNOSIS — M79605 Pain in left leg: Secondary | ICD-10-CM

## 2016-04-22 DIAGNOSIS — M7989 Other specified soft tissue disorders: Secondary | ICD-10-CM | POA: Insufficient documentation

## 2016-04-22 DIAGNOSIS — E785 Hyperlipidemia, unspecified: Secondary | ICD-10-CM | POA: Insufficient documentation

## 2016-04-22 DIAGNOSIS — I10 Essential (primary) hypertension: Secondary | ICD-10-CM | POA: Insufficient documentation

## 2016-06-07 ENCOUNTER — Encounter (HOSPITAL_COMMUNITY): Payer: Self-pay | Admitting: Emergency Medicine

## 2016-06-07 ENCOUNTER — Emergency Department (HOSPITAL_COMMUNITY)
Admission: EM | Admit: 2016-06-07 | Discharge: 2016-06-07 | Disposition: A | Discharge status: Home / Self Care | Payer: BLUE CROSS/BLUE SHIELD | Attending: Emergency Medicine | Admitting: Emergency Medicine

## 2016-06-07 ENCOUNTER — Inpatient Hospital Stay (HOSPITAL_COMMUNITY): Admit: 2016-06-07 | Payer: Self-pay

## 2016-06-07 DIAGNOSIS — I10 Essential (primary) hypertension: Secondary | ICD-10-CM | POA: Diagnosis not present

## 2016-06-07 DIAGNOSIS — M79605 Pain in left leg: Secondary | ICD-10-CM

## 2016-06-07 DIAGNOSIS — E039 Hypothyroidism, unspecified: Secondary | ICD-10-CM | POA: Insufficient documentation

## 2016-06-07 DIAGNOSIS — I82402 Acute embolism and thrombosis of unspecified deep veins of left lower extremity: Secondary | ICD-10-CM

## 2016-06-07 DIAGNOSIS — Z79899 Other long term (current) drug therapy: Secondary | ICD-10-CM | POA: Diagnosis not present

## 2016-06-07 DIAGNOSIS — F17210 Nicotine dependence, cigarettes, uncomplicated: Secondary | ICD-10-CM | POA: Insufficient documentation

## 2016-06-07 DIAGNOSIS — M7989 Other specified soft tissue disorders: Secondary | ICD-10-CM

## 2016-06-07 NOTE — Discharge Instructions (Signed)
Continue to wear your compression stockings. Continue taking your xarelto. Follow up with Dr. Berline Choughigby in the office tomorrow. Follow up with your primary care doctor to make sure you don't need to have blood pressure medicine. Return to the ED if you develop chest pain, shortness or breath.

## 2016-06-07 NOTE — ED Triage Notes (Signed)
Pt brought in with sheriff. 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

## 2016-06-07 NOTE — ED Provider Notes (Signed)
[facility]-EMERGENCY DEPT Provider Note   CSN: [id number] Arrival date & time: 06/07/16  1048     History   Chief Complaint Chief Complaint  Patient presents with  . Leg Pain    HPI David Tanner is a 30 y.o. male.  30 year old African-American male past medical history essentially for DVT currently undergoing treatment presents to the ED today with worsening left leg pain and swelling. Patient is currently incarcerated on the weekend. Today he was seeing the nurse at the jail when he noticed that his left leg was more swollen and tender. He tried nothing for the pain. Walking makes the pain worse. Patient does wear compression stockings. He was not wearing his stocking compression this morning. Patient denies any numbness or tingling in his leg. Denies any fever, chills, headache, vision changes, chest pain, shortness of breath, abdominal pain. Patient currently being treated with Xarelto that was started at the end of August. He is followed by Dr. Berline [facility]oughigby at [facility]. Patient states he takes his medications daily.      Past Medical History:  Diagnosis Date  . Hyperlipidemia   . Hypothyroid   . Vitamin D deficiency     Patient Active Problem List   Diagnosis Date Noted  . Prediabetes 03/13/2015  . Morbid obesity (HCC) 03/13/2015  . Essential hypertension 03/13/2015  . Medication management 03/13/2015  . Noncompliance 03/13/2015  . Hypothyroid   . Vitamin D deficiency   . Hyperlipidemia     History reviewed. No pertinent surgical history.     Home Medications    Prior to Admission medications   Medication Sig Start Date End Date Taking? Authorizing Provider  rivaroxaban (XARELTO) 20 MG TABS tablet Take 20 mg by mouth every morning.   Yes Historical Provider, MD  atorvastatin (LIPITOR) 20 MG tablet Take 1 tablet (20 mg total) by mouth daily. Patient not taking: Reported on 03/07/2016 03/13/15 03/12/16  Quentin MullingAmanda Collier, PA-C  levothyroxine (SYNTHROID) 75 MCG  tablet Take 1 tablet (75 mcg total) by mouth daily. Patient not taking: Reported on 06/07/2016 03/14/16 03/14/17  Quentin MullingAmanda Collier, PA-C  naproxen (NAPROSYN) 500 MG tablet Take 1 tablet (500 mg total) by mouth 2 (two) times daily with a meal. Patient not taking: Reported on 06/07/2016 03/24/16   Everlene FarrierWilliam Dansie, PA-C    Family History Family History  Problem Relation Age of Onset  . Hypertension Mother   . Cancer Maternal Grandfather     leukemia    Social History Social History  Substance Use Topics  . Smoking status: Current Every Day Smoker    Packs/day: 0.25    Years: 1.00    Types: Cigarettes  . Smokeless tobacco: Never Used  . Alcohol use No     Allergies   Review of patient's allergies indicates no known allergies.   Review of Systems Review of Systems  Constitutional: Negative for chills and fever.  HENT: Negative for congestion, ear pain, rhinorrhea and sore throat.   Eyes: Negative for pain and discharge.  Respiratory: Negative for cough and shortness of breath.   Cardiovascular: Positive for leg swelling. Negative for chest pain and palpitations.  Gastrointestinal: Negative for abdominal pain, diarrhea, nausea and vomiting.  Genitourinary: Negative for flank pain, frequency, hematuria and urgency.  Musculoskeletal: Negative for myalgias and neck pain.  Neurological: Negative for dizziness, syncope, weakness, light-headedness, numbness and headaches.  All other systems reviewed and are negative.    Physical Exam Updated Vital Signs BP 157/81 (BP Location: Left Arm)  Pulse 66   Temp 98.6 F (37 C) (Oral)   Resp 18   SpO2 98%   Physical Exam  Constitutional: He appears well-developed and well-nourished. No distress.  HENT:  Head: Normocephalic and atraumatic.  Mouth/Throat: Oropharynx is clear and moist.  Eyes: Conjunctivae are normal. Pupils are equal, round, and reactive to light. Right eye exhibits no discharge. Left eye exhibits no discharge. No scleral  icterus.  Neck: Normal range of motion. Neck supple. No thyromegaly present.  Cardiovascular: Normal rate, regular rhythm, normal heart sounds and intact distal pulses.   Pulmonary/Chest: Effort normal and breath sounds normal.  Abdominal: Soft. Bowel sounds are normal. He exhibits no distension. There is no tenderness.  Musculoskeletal: Normal range of motion.  Mild edema to left leg, with calf tenderness. No erythema.Mild tenderness to palpation. DP pulses 2+ bilaterally. Cap refill normal.Full ROM or extremity. Foot is not cyanotic or cold.  Lymphadenopathy:    He has no cervical adenopathy.  Neurological: He is alert.  Skin: Skin is warm and dry. Capillary refill takes less than 2 seconds.  Vitals reviewed.    ED Treatments / Results  Labs (all labs ordered are listed, but only abnormal results are displayed) Labs Reviewed - No data to display  EKG  EKG Interpretation None       Radiology No results found.  Procedures Procedures (including critical care time)  Medications Ordered in ED Medications - No data to display   Initial Impression / Assessment and Plan / ED Course  I have reviewed the triage vital signs and the nursing notes.  Pertinent labs & imaging results that were available during my care of the patient were reviewed by me and considered in my medical decision making (see chart for details).  Clinical Course  Patient presented to the ED today with left leg swelling and pain. Patient is currently being treated for DVT with Xarelto followed by bilateral orthopedics. DP pulses are 2+ bilaterally. No signs of cyanosis or poor blood flow. Patient denies any chest pain or shortness of breath concerning for pulmonary embolism. Patient with known DVT and currently being treated. Reason for re-imaging. Talked with Dr. Berline Chough. Discussed with him about switching the patient to Lovenox. He states that he would see patient in office on Monday. Recommended patient stay on  Xarelto. Encourage patient to continue to wear his compression stockings. He also recommends that patient continue taking his blood pressure medicine. The patient he states he does not have any blood pressure medicine and cannot remember what he used to take. Patient encouraged to follow up with primary care doctor to reassess need for blood pressure.exam seems stable at this time. Patient is hemodynamically stable. Strict return precautions given for patient  Concerning signs of pulmonary embolism. Encouraged to continue taking xarelto. Patient verbalized understanding of plan of care. Discussed plan of care with Dr. Juleen China who agrees with plan. Discharge back to jail no acute distress with stable vital signs.   Final Clinical Impressions(s) / ED Diagnoses   Final diagnoses:  Left leg pain  Left leg swelling  DVT (deep venous thrombosis), left    New Prescriptions New Prescriptions   No medications on file     Rise Mu, PA-C 06/07/16 1552    Raeford Razor, MD 06/08/16 1114

## 2016-06-10 ENCOUNTER — Other Ambulatory Visit: Payer: Self-pay | Admitting: Physician Assistant

## 2016-06-10 DIAGNOSIS — E039 Hypothyroidism, unspecified: Secondary | ICD-10-CM

## 2016-06-25 ENCOUNTER — Ambulatory Visit (INDEPENDENT_AMBULATORY_CARE_PROVIDER_SITE_OTHER): Payer: BLUE CROSS/BLUE SHIELD | Admitting: Physician Assistant

## 2016-06-25 ENCOUNTER — Encounter: Payer: Self-pay | Admitting: Physician Assistant

## 2016-06-25 DIAGNOSIS — I82402 Acute embolism and thrombosis of unspecified deep veins of left lower extremity: Secondary | ICD-10-CM | POA: Diagnosis not present

## 2016-06-25 DIAGNOSIS — R7303 Prediabetes: Secondary | ICD-10-CM | POA: Diagnosis not present

## 2016-06-25 DIAGNOSIS — Z79899 Other long term (current) drug therapy: Secondary | ICD-10-CM

## 2016-06-25 DIAGNOSIS — E559 Vitamin D deficiency, unspecified: Secondary | ICD-10-CM

## 2016-06-25 DIAGNOSIS — E039 Hypothyroidism, unspecified: Secondary | ICD-10-CM

## 2016-06-25 DIAGNOSIS — I10 Essential (primary) hypertension: Secondary | ICD-10-CM | POA: Diagnosis not present

## 2016-06-25 DIAGNOSIS — E785 Hyperlipidemia, unspecified: Secondary | ICD-10-CM

## 2016-06-25 LAB — HEPATIC FUNCTION PANEL
ALK PHOS: 41 U/L (ref 40–115)
ALT: 22 U/L (ref 9–46)
AST: 26 U/L (ref 10–40)
Albumin: 4.2 g/dL (ref 3.6–5.1)
BILIRUBIN INDIRECT: 0.2 mg/dL (ref 0.2–1.2)
Bilirubin, Direct: 0.1 mg/dL (ref <=0.2)
TOTAL PROTEIN: 6.4 g/dL (ref 6.1–8.1)
Total Bilirubin: 0.3 mg/dL (ref 0.2–1.2)

## 2016-06-25 LAB — BASIC METABOLIC PANEL WITH GFR
BUN: 9 mg/dL (ref 7–25)
CHLORIDE: 105 mmol/L (ref 98–110)
CO2: 20 mmol/L (ref 20–31)
CREATININE: 1.39 mg/dL — AB (ref 0.60–1.35)
Calcium: 9.3 mg/dL (ref 8.6–10.3)
GFR, Est African American: 79 mL/min (ref >=60)
GFR, Est Non African American: 68 mL/min (ref >=60)
GLUCOSE: 95 mg/dL (ref 65–99)
Potassium: 4 mmol/L (ref 3.5–5.3)
Sodium: 140 mmol/L (ref 135–146)

## 2016-06-25 LAB — CBC WITH DIFFERENTIAL/PLATELET
BASOS ABS: 58 {cells}/uL (ref 0–200)
Basophils Relative: 1 %
EOS ABS: 116 {cells}/uL (ref 15–500)
Eosinophils Relative: 2 %
HEMATOCRIT: 42.7 % (ref 38.5–50.0)
Hemoglobin: 14.5 g/dL (ref 13.2–17.1)
LYMPHS PCT: 44 %
Lymphs Abs: 2552 cells/uL (ref 850–3900)
MCH: 28.4 pg (ref 27.0–33.0)
MCHC: 34 g/dL (ref 32.0–36.0)
MCV: 83.7 fL (ref 80.0–100.0)
MONO ABS: 464 {cells}/uL (ref 200–950)
MONOS PCT: 8 %
MPV: 10.2 fL (ref 7.5–12.5)
Neutro Abs: 2610 cells/uL (ref 1500–7800)
Neutrophils Relative %: 45 %
PLATELETS: 307 10*3/uL (ref 140–400)
RBC: 5.1 MIL/uL (ref 4.20–5.80)
RDW: 15 % (ref 11.0–15.0)
WBC: 5.8 10*3/uL (ref 3.8–10.8)

## 2016-06-25 LAB — LIPID PANEL
CHOLESTEROL: 199 mg/dL (ref 125–200)
HDL: 33 mg/dL — ABNORMAL LOW (ref >=40)
LDL CALC: 115 mg/dL (ref <130)
Total CHOL/HDL Ratio: 6 Ratio — ABNORMAL HIGH (ref <=5.0)
Triglycerides: 255 mg/dL — ABNORMAL HIGH (ref <150)
VLDL: 51 mg/dL — AB (ref <30)

## 2016-06-25 LAB — MAGNESIUM: MAGNESIUM: 2 mg/dL (ref 1.5–2.5)

## 2016-06-25 LAB — TSH: TSH: 6.57 mIU/L — ABNORMAL HIGH (ref 0.40–4.50)

## 2016-06-25 MED ORDER — ATORVASTATIN CALCIUM 20 MG PO TABS: 20.0000 mg | ORAL_TABLET | Freq: Every day | ORAL | 0 refills | Status: DC

## 2016-06-25 MED ORDER — LEVOTHYROXINE SODIUM 75 MCG PO TABS: 75.0000 ug | ORAL_TABLET | Freq: Every day | ORAL | 0 refills | Status: DC

## 2016-06-25 NOTE — Patient Instructions (Signed)
Hypothyroidism Hypothyroidism is a disorder of the thyroid. The thyroid is a large gland that is located in the lower front of the neck. The thyroid releases hormones that control how the body works. With hypothyroidism, the thyroid does not make enough of these hormones. CAUSES Causes of hypothyroidism may include:  Viral infections.  Pregnancy.  Your own defense system (immune system) attacking your thyroid.  Certain medicines.  Birth defects.  Past radiation treatments to your head or neck.  Past treatment with radioactive iodine.  Past surgical removal of part or all of your thyroid.  Problems with the gland that is located in the center of your brain (pituitary). SIGNS AND SYMPTOMS Signs and symptoms of hypothyroidism may include:  Feeling as though you have no energy (lethargy).  Inability to tolerate cold.  Weight gain that is not explained by a change in diet or exercise habits.  Dry skin.  Coarse hair.  Menstrual irregularity.  Slowing of thought processes.  Constipation.  Sadness or depression. DIAGNOSIS  Your health care provider may diagnose hypothyroidism with blood tests and ultrasound tests. TREATMENT Hypothyroidism is treated with medicine that replaces the hormones that your body does not make. After you begin treatment, it may take several weeks for symptoms to go away. HOME CARE INSTRUCTIONS   Take medicines only as directed by your health care provider.  If you start taking any new medicines, tell your health care provider.  Keep all follow-up visits as directed by your health care provider. This is important. As your condition improves, your dosage needs may change. You will need to have blood tests regularly so that your health care provider can watch your condition. SEEK MEDICAL CARE IF:  Your symptoms do not get better with treatment.  You are taking thyroid replacement medicine and:  You sweat excessively.  You have tremors.  You  feel anxious.  You lose weight rapidly.  You cannot tolerate heat.  You have emotional swings.  You have diarrhea.  You feel weak. SEEK IMMEDIATE MEDICAL CARE IF:   You develop chest pain.  You develop an irregular heartbeat.  You develop a rapid heartbeat.   This information is not intended to replace advice given to you by your health care provider. Make sure you discuss any questions you have with your health care provider.   Document Released: 09/16/2005 Document Revised: 10/07/2014 Document Reviewed: 02/01/2014 Elsevier Interactive Patient Education 2016 Elsevier Inc.    Venous Thromboembolism Prevention Venous thromboembolism (VTE) is a condition in which a blood clot (thrombus) develops in the body. A thrombus usually occurs in a deep vein in the leg or the pelvis (DVT), but it can also occur in the arm. Sometimes, pieces of a thrombus can break off from its original place of development and travel through the bloodstream to other parts of the body. When that happens, the thrombus is called an embolus. An embolus that travels to one or both lungs is called a pulmonary embolism. An embolism can block the blood flow in the blood vessels of other organs as well. VTE is a serious health condition that can cause disability or death. It is very important to get help right away and to not ignore symptoms. HOW CAN A VTE BE PREVENTED?  Exercise regularly. Take a brisk 30 minute walk every day. Staying active and moving around can help you to prevent blood clots.  Avoid sitting or lying in bed for long periods of time without moving your legs. Change your position often,  especially during long-distance travel (over 4 hours).  If you are a woman who is over 28 years of age, avoid unnecessary use of medicines that contain estrogen. These include birth control pills and hormone replacement therapy.  Do not smoke, especially if you take estrogen medicines. If you need help quitting,  ask your health care provider.  Eat plenty of fruits and vegetables. Ask your health care provider or dietitian if there are foods that you should avoid.  Maintain a weight that is appropriate for your height. Ask your health care provider what weight is healthy for you.  Wear loose-fitting clothing. Avoid constrictive or tight clothing around your legs or waist.  Try not to bump or injure your legs. Avoid crossing your legs when you are sitting.  Do not use pillows under your knees while lying down unless told by your health care provider.  Wear support hose (compression stockings or TED hose) as told by your health care provider Compression stockings increase blood flow in your legs and can help prevent blood clots. Do not let them bunch up when you are wearing them. HOW CAN I PREVENT VTE WHEN I TRAVEL? Long-distance travel (over 4 hours) can increase the risk of a VTE. To prevent VTE when traveling:  Exercise your legs every hour by standing, stretching, and bending and straightening your legs. If you are traveling by airplane, train, or bus, walk up and down the aisle as often as possible to get your blood moving. If you are traveling by car, stop and get out of the car every hour to exercise your legs and stretch. Other types of exercise might include:  Keeping your feet flat on the ground and raising your toes.  Switching from tightening the muscles in your calves and thighs to relaxing those same muscles while you are sitting.  Pointing and flexing your feet at the ankle joints while you are sitting.  Stay well hydrated while traveling. Drink enough water to keep your urine clear or pale yellow.  Avoid drinking alcohol during long travel. Generally, it is not recommended that you take medicines to prevent DVT during routine travel. HOW CAN VTE BE PREVENTED IF I AM HOSPITALIZED? A VTE may be prevented by taking medicines that are prescribed to prevent blood clots (anticoagulants).  You can also help to prevent VTE while in the hospital by taking these actions:  Get out of bed and walk. Ask your health care provider if this is safe for you to do.  Request the use of a sequential compression device (SCD). This is a machine that pumps air into compression sleeves that are wrapped around your legs.  Request the use of compression stockings, which are tight, elastic stockings that apply pressure to the lower legs. Compression stockings are sometimes used with SCDs. HOW CAN I PREVENT VTE AFTER SURGERY? Understand that there is an increased risk for VTE for the first 4-6 weeks after surgery. During this time:  Avoid long-distance travel (over 4 hours). If you must travel during this time, ask your health care provider about additional preventive actions that you can take. These might include exercising your arms and legs every hour while you travel.  Avoid sitting or lying still for too long. If possible, get up and walk around one time every hour. Ask your health care provider when this is safe for you to do. SEEK IMMEDIATE MEDICAL CARE IF:  You have new or increased pain, swelling, or redness in an arm or leg.  You have numbness or tingling in an arm or leg.  You have shortness of breath while active or at rest.  You have chest pain.  You have a rapid or irregular heartbeat.  You feel light-headed or dizzy.  You cough up blood.  You notice blood in your vomit, bowel movement, or urine. These symptoms may represent a serious problem that is an emergency. Do not wait to see if the symptoms will go away. Get medical help right away. Call your local emergency services (911 in the [place] Do not drive yourself to the hospital.   This information is not intended to replace advice given to you by your health care provider. Make sure you discuss any questions you have with your health care provider.   Document Released: 09/04/2009 Document Revised: 06/07/2015 Document  Reviewed: 01/11/2015 Elsevier Interactive Patient Education [employer]2016 Elsevier Inc.

## 2016-06-25 NOTE — Progress Notes (Signed)
Assessment and Plan:   Morbid obesity, unspecified obesity type (HCC) - long discussion about weight loss, diet, and exercise  Prediabetes -     Hemoglobin A1c  Hyperlipidemia -get back on meds medications, check lipids, decrease fatty foods, increase activity.  -     Lipid panel -     atorvastatin (LIPITOR) 20 MG tablet; Take 1 tablet (20 mg total) by mouth daily.  Hypothyroidism, unspecified hypothyroidism type -     TSH -   Has not been on medication, will get on it and we will send in refill -     levothyroxine (SYNTHROID, LEVOTHROID) 75 MCG tablet; Take 1 tablet (75 mcg total) by mouth daily.  Essential hypertension - continue medications, DASH diet, exercise and monitor at home. Call if greater than 130/80.  -     CBC with Differential/Platelet -     BASIC METABOLIC PANEL WITH GFR -     Hepatic function panel  DVT (deep venous thrombosis), left Continue xaretlo, follow up ortho, will need to be on ASA after xarelto, likely provoked DVT  Medication management -     Magnesium  Vitamin D deficiency Start on medication   Continue diet and meds as discussed. Further disposition pending results of labs. Over 30 minutes of exam, counseling, chart review, and critical decision making was performed  Future Appointments Date Time Provider Department Center  03/18/2017 10:00 AM Quentin Mulling, PA-C [id number] None     HPI 30 y.o. male  presents for 3 month follow up on hypertension, cholesterol, prediabetes, and vitamin D deficiency.   Patient injured his leg 06/25, started on a boot for his leg, and diagnosed by ortho with DVT on 04/22/2016, he is on xarelto, will do 3 months of it, has 1 more month to go. Grandmother has blood clot history. Was wearing boot/inactivity at the time of the DVT, he is following with Dr. Berline Chough.   His blood pressure has been controlled at home, today their BP is BP: 118/66  He does not workout. He denies chest pain, shortness of breath,  dizziness.  He is not on cholesterol medication and denies myalgias. His cholesterol is not at goal. The cholesterol last visit was:   Lab Results  Component Value Date   CHOL 200 03/14/2016   HDL 40 03/14/2016   LDLCALC 130 (H) 03/14/2016   TRIG 148 03/14/2016   CHOLHDL 5.0 03/14/2016    He has been working on diet and exercise for prediabetes, and denies paresthesia of the feet, polydipsia, polyuria and visual disturbances. Last A1C in the office was:  Lab Results  Component Value Date   HGBA1C 5.8 (H) 03/14/2016   Patient is on Vitamin D supplement.   Lab Results  Component Value Date   VD25OH 13 (L) 03/14/2016     He is not on thyroid medication, he was unsure if he could take it with his xareto Lab Results  Component Value Date   TSH 10.38 (H) 04/16/2016  .  BMI is Body mass index is 46.48 kg/m., he is working on diet and exercise. Wt Readings from Last 3 Encounters:  06/25/16 (!) 338 lb (153.3 kg)  03/24/16 (!) 332 lb (150.6 kg)  03/14/16 (!) 332 lb 12.8 oz (151 kg)    Current Medications:  Current Outpatient Prescriptions on File Prior to Visit  Medication Sig Dispense Refill  . levothyroxine (SYNTHROID, LEVOTHROID) 75 MCG tablet TAKE 1 TABLET (75 MCG TOTAL) BY MOUTH DAILY. 90 tablet 0  . naproxen (  NAPROSYN) 500 MG tablet Take 1 tablet (500 mg total) by mouth 2 (two) times daily with a meal. 30 tablet 0  . rivaroxaban (XARELTO) 20 MG TABS tablet Take 20 mg by mouth every morning.    Marland Kitchen. atorvastatin (LIPITOR) 20 MG tablet Take 1 tablet (20 mg total) by mouth daily. (Patient not taking: Reported on 03/07/2016) 90 tablet 0   No current facility-administered medications on file prior to visit.    Medical History:  Past Medical History:  Diagnosis Date  . Hyperlipidemia   . Hypothyroid   . Vitamin D deficiency    Allergies: No Known Allergies   Review of Systems:  Review of Systems  Constitutional: Negative.   HENT: Negative.   Eyes: Negative.   Respiratory:  Negative.   Cardiovascular: Positive for leg swelling (left leg).  Gastrointestinal: Negative.   Genitourinary: Negative.   Musculoskeletal: Positive for joint pain (left hip pain, worse with exertion). Negative for back pain, falls, myalgias and neck pain.  Skin: Negative.   Neurological: Negative.   Endo/Heme/Allergies: Negative.   Psychiatric/Behavioral: Negative.     Family history- Review and unchanged Social history- Review and unchanged Physical Exam: BP 118/66   Pulse 71   Temp 98.2 F (36.8 C)   Resp 16   Ht 5' 11.5" (1.816 m)   Wt (!) 338 lb (153.3 kg)   SpO2 98%   BMI 46.48 kg/m  Wt Readings from Last 3 Encounters:  06/25/16 (!) 338 lb (153.3 kg)  03/24/16 (!) 332 lb (150.6 kg)  03/14/16 (!) 332 lb 12.8 oz (151 kg)   General Appearance: Well nourished, in no apparent distress. Eyes: PERRLA, EOMs, conjunctiva no swelling or erythema Sinuses: No Frontal/maxillary tenderness ENT/Mouth: Ext aud canals clear, TMs without erythema, bulging. No erythema, swelling, or exudate on post pharynx.  Tonsils not swollen or erythematous. Hearing normal.  Neck: Supple, thyroid normal.  Respiratory: Respiratory effort normal, BS equal bilaterally without rales, rhonchi, wheezing or stridor.  Cardio: RRR with no MRGs. Brisk peripheral pulses without edema, left leg in compression stocking.  Abdomen: Soft, + BS,  Non tender, no guarding, rebound, hernias, masses. Lymphatics: Non tender without lymphadenopathy.  Musculoskeletal: Full ROM, 5/5 strength, Normal gait Skin: Warm, dry without rashes, lesions, ecchymosis.  Neuro: Cranial nerves intact. Normal muscle tone, no cerebellar symptoms. Psych: Awake and oriented X 3, normal affect, Insight and Judgment appropriate.    Quentin MullingAmanda Naveen Clardy, PA-C 10:54 AM [facility]

## 2016-06-26 ENCOUNTER — Other Ambulatory Visit: Payer: Self-pay | Admitting: Physician Assistant

## 2016-06-26 DIAGNOSIS — E039 Hypothyroidism, unspecified: Secondary | ICD-10-CM

## 2016-06-26 LAB — HEMOGLOBIN A1C
HEMOGLOBIN A1C: 5.4 % (ref <5.7)
MEAN PLASMA GLUCOSE: 108 mg/dL

## 2016-07-23 ENCOUNTER — Encounter (INDEPENDENT_AMBULATORY_CARE_PROVIDER_SITE_OTHER): Payer: Self-pay | Admitting: Sports Medicine

## 2016-07-23 ENCOUNTER — Ambulatory Visit (INDEPENDENT_AMBULATORY_CARE_PROVIDER_SITE_OTHER): Payer: BLUE CROSS/BLUE SHIELD | Admitting: Sports Medicine

## 2016-07-23 VITALS — BP 131/80 | HR 62 | Ht 71.0 in

## 2016-07-23 DIAGNOSIS — S92215D Nondisplaced fracture of cuboid bone of left foot, subsequent encounter for fracture with routine healing: Secondary | ICD-10-CM | POA: Diagnosis not present

## 2016-07-23 DIAGNOSIS — I82412 Acute embolism and thrombosis of left femoral vein: Secondary | ICD-10-CM

## 2016-07-23 MED ORDER — RIVAROXABAN 20 MG PO TABS: 20.0000 mg | ORAL_TABLET | Freq: Every day | ORAL | 0 refills | Status: DC

## 2016-07-23 NOTE — Patient Instructions (Signed)
It was good to see you.  [facility] am [facility] [place] and would like to follow up with you in 3 months there.  Please reference [email or web address] for more contact information around the new year.   We will have you continue Xarelto for an additional 3 months then plan to discontinue.   Continue wearing your compression socks.

## 2016-07-23 NOTE — Progress Notes (Signed)
David Tanner - 30 y.o. male MRN [id number]  Date of birth: [birth year]  Office Visit Note: Visit Date: 07/23/2016 PCP: David Corwin, MD Referred by: David Cowboy, MD  Assessment & Plan: Visit Diagnoses:  1. DVT of deep femoral vein, left (HCC)   2. Closed nondisplaced fracture of cuboid of left foot with routine healing, subsequent encounter      Plan:   Doing well. Will plan additional 3 months of anti-coag given persisent discrepancy in LE size.  Ankle doing well.  Reviewed Red Flags with chronic anti-coag including importance of adherence to regimen.       Patient Instructions  It was good to see you.  I am moving locations to [facility] and would like to follow up with you in 3 months there.  Please reference [email or web address] for more contact information around the new year.   We will have you continue Xarelto for an additional 3 months then plan to discontinue.   Continue wearing your compression socks.  Meds:  Meds ordered this encounter  Medications  . rivaroxaban (XARELTO) 20 MG TABS tablet    Sig: Take 1 tablet (20 mg total) by mouth daily with supper.    Dispense:  90 tablet    Refill:  0    Orders:No orders of the defined types were placed in this encounter.   Follow-up: Return if symptoms worsen or fail to improve. Will plan to see him in 3 months at [facility] location  Procedures: No procedures performed  No notes on file   Clinical History: Findings:  04/22/2016: Duplex imaging of the left lower extremity deep and superficial veins reveals them to be noncompressible with intraluminal thrombus in the left distal femoral, popliteal, gastrocnemius, proximal to mid posterior tibial and peroneal veins.   No specialty comments available.  Subjective: Chief Complaint  Patient presents with  . Follow-up    Left leg/ankle. pt states still some swelling in left ankle   Feeling better. No significant LE pain. Dependent  swelling is persistent and only minimal. Using compression socks. No mechanical symptoms or persistent symptoms with left ankle.  Has played basketball without ankle brace and had not significant discomfort. No bleeding, hematuria, or nose bleeds reported.   Review of Systems  Constitutional: Negative.        Otherwise per HPI    Objective: He reports that he has quit smoking. His smoking use included Cigarettes. He has a 0.25 pack-year smoking history. He has never used smokeless tobacco. VS:  HT:5\' 11"  (180.3 cm)   WT:   BMI:         BP:131/80  HR:62bpm         TEMP: ( )  RESP:     Physical Exam  Constitutional: He appears well-developed and well-nourished. No distress.  Pulmonary/Chest: Effort normal. No respiratory distress.  Musculoskeletal:  Left leg: 51 @ calf & 29.5 @ ankle Right: 48 @ calf and 28.5 @ ankle  Skin: He is not diaphoretic.  Psychiatric: He has a normal mood and affect. Judgment normal.    Ortho Exam  Left Ankle: trace pitting edema bilaterally. Stable ankle drawer. DP and PT pulses 2+/4. No focal TTP.  Imaging: No results found.  Past Medical/Family/Surgical/Social History: Patient Active Problem List   Diagnosis Date Noted  . Prediabetes 03/13/2015  . Morbid obesity (HCC) 03/13/2015  . Essential hypertension 03/13/2015  . Medication management 03/13/2015  . Noncompliance 03/13/2015  . Hypothyroid   . Vitamin D  deficiency   . Hyperlipidemia    Past Medical History:  Diagnosis Date  . Hyperlipidemia   . Hypothyroid   . Vitamin D deficiency    Family History  Problem Relation Age of Onset  . Hypertension Mother   . Cancer Maternal Grandfather     leukemia   No past surgical history on file. Social History   Occupational History  . Not on file.   Social History Main Topics  . Smoking status: Former Smoker    Packs/day: 0.25    Years: 1.00    Types: Cigarettes  . Smokeless tobacco: Never Used  . Alcohol use No  . Drug use: No  .  Sexual activity: Yes     Comment: With girlfriend for 4 years

## 2016-08-05 ENCOUNTER — Other Ambulatory Visit: Payer: Self-pay

## 2016-10-04 ENCOUNTER — Ambulatory Visit: Payer: Self-pay | Admitting: Physician Assistant

## 2016-11-29 ENCOUNTER — Emergency Department (HOSPITAL_COMMUNITY)
Admission: EM | Admit: 2016-11-29 | Discharge: 2016-11-29 | Disposition: A | Discharge status: Home / Self Care | Payer: Self-pay | Attending: Emergency Medicine | Admitting: Emergency Medicine

## 2016-11-29 ENCOUNTER — Emergency Department (HOSPITAL_COMMUNITY): Payer: Self-pay

## 2016-11-29 ENCOUNTER — Encounter (HOSPITAL_COMMUNITY): Payer: Self-pay

## 2016-11-29 DIAGNOSIS — R0789 Other chest pain: Secondary | ICD-10-CM | POA: Insufficient documentation

## 2016-11-29 DIAGNOSIS — E039 Hypothyroidism, unspecified: Secondary | ICD-10-CM | POA: Insufficient documentation

## 2016-11-29 DIAGNOSIS — I10 Essential (primary) hypertension: Secondary | ICD-10-CM | POA: Insufficient documentation

## 2016-11-29 DIAGNOSIS — F17210 Nicotine dependence, cigarettes, uncomplicated: Secondary | ICD-10-CM | POA: Insufficient documentation

## 2016-11-29 DIAGNOSIS — Z79899 Other long term (current) drug therapy: Secondary | ICD-10-CM | POA: Insufficient documentation

## 2016-11-29 LAB — CBC WITH DIFFERENTIAL/PLATELET
BASOS ABS: 0 10*3/uL (ref 0.0–0.1)
Basophils Relative: 0 %
Eosinophils Absolute: 0 10*3/uL (ref 0.0–0.7)
Eosinophils Relative: 0 %
HEMATOCRIT: 41.9 % (ref 39.0–52.0)
Hemoglobin: 14.3 g/dL (ref 13.0–17.0)
LYMPHS PCT: 28 %
Lymphs Abs: 1.9 10*3/uL (ref 0.7–4.0)
MCH: 28.9 pg (ref 26.0–34.0)
MCHC: 34.1 g/dL (ref 30.0–36.0)
MCV: 84.6 fL (ref 78.0–100.0)
MONO ABS: 0.4 10*3/uL (ref 0.1–1.0)
Monocytes Relative: 6 %
NEUTROS ABS: 4.4 10*3/uL (ref 1.7–7.7)
Neutrophils Relative %: 66 %
Platelets: 267 10*3/uL (ref 150–400)
RBC: 4.95 MIL/uL (ref 4.22–5.81)
RDW: 13.9 % (ref 11.5–15.5)
WBC: 6.7 10*3/uL (ref 4.0–10.5)

## 2016-11-29 LAB — I-STAT TROPONIN, ED
Troponin i, poc: 0 ng/mL (ref 0.00–0.08)
Troponin i, poc: 0 ng/mL (ref 0.00–0.08)

## 2016-11-29 LAB — D-DIMER, QUANTITATIVE (NOT AT ARMC)

## 2016-11-29 LAB — HEPATIC FUNCTION PANEL
ALBUMIN: 4 g/dL (ref 3.5–5.0)
ALT: 29 U/L (ref 17–63)
AST: 22 U/L (ref 15–41)
Alkaline Phosphatase: 38 U/L (ref 38–126)
Bilirubin, Direct: 0.1 mg/dL (ref 0.1–0.5)
Indirect Bilirubin: 0.7 mg/dL (ref 0.3–0.9)
Total Bilirubin: 0.8 mg/dL (ref 0.3–1.2)
Total Protein: 6.4 g/dL — ABNORMAL LOW (ref 6.5–8.1)

## 2016-11-29 LAB — BASIC METABOLIC PANEL
ANION GAP: 10 (ref 5–15)
BUN: 8 mg/dL (ref 6–20)
CALCIUM: 9.2 mg/dL (ref 8.9–10.3)
CO2: 23 mmol/L (ref 22–32)
Chloride: 106 mmol/L (ref 101–111)
Creatinine, Ser: 1.26 mg/dL — ABNORMAL HIGH (ref 0.61–1.24)
GFR calc Af Amer: >60 mL/min (ref >60)
GFR calc non Af Amer: >60 mL/min (ref >60)
GLUCOSE: 89 mg/dL (ref 65–99)
POTASSIUM: 3.9 mmol/L (ref 3.5–5.1)
Sodium: 139 mmol/L (ref 135–145)

## 2016-11-29 LAB — LIPASE, BLOOD: LIPASE: 29 U/L (ref 11–51)

## 2016-11-29 MED ORDER — GI COCKTAIL ~~LOC~~
30.0000 mL | Freq: Once | ORAL | Status: AC
  Administered 2016-11-29: 30 mL via ORAL
  Filled 2016-11-29: qty 30

## 2016-11-29 MED ORDER — KETOROLAC TROMETHAMINE 15 MG/ML IJ SOLN
15.0000 mg | Freq: Once | INTRAMUSCULAR | Status: AC
  Administered 2016-11-29: 15 mg via INTRAVENOUS
  Filled 2016-11-29: qty 1

## 2016-11-29 MED ORDER — HYDROCODONE-ACETAMINOPHEN 5-325 MG PO TABS
2.0000 | ORAL_TABLET | Freq: Once | ORAL | Status: AC
  Administered 2016-11-29: 2 via ORAL
  Filled 2016-11-29: qty 2

## 2016-11-29 MED ORDER — OMEPRAZOLE 20 MG PO CPDR: 20.0000 mg | DELAYED_RELEASE_CAPSULE | Freq: Every day | ORAL | 0 refills | Status: DC

## 2016-11-29 NOTE — ED Triage Notes (Signed)
Pt. Coming [facility] via [facility] for chest pain x3days. CP centralized and feels like pins and needles. Pt. Denies SOB or N/V. Pt. Given 324 ASA en route. Pt. Hx of left leg DVT, but has not been taking blood thinner because it ran out. Pt. States his left leg has been bothering him again. Pt. Jail every weekend. Pt. Ambulatory to bed.

## 2016-11-29 NOTE — ED Notes (Signed)
Patient transported to X-ray 

## 2016-11-29 NOTE — ED Notes (Signed)
EDP at bedside  

## 2016-11-29 NOTE — ED Provider Notes (Signed)
[facility]-EMERGENCY DEPT Provider Note   CSN: [id number] Arrival date & time: 11/29/16  1030     History   Chief Complaint Chief Complaint  Patient presents with  . Chest Pain    HPI David Tanner is a 31 y.o. male.  HPI   31 yo M with PMHx of HTN, HLD, obesity, PE here with chest pain. Pt states his sx started as an aching, gnawing epigastric pain radiating to his throat. He then developed nausea, mild shortness of breath and intermittent "stabbing" pains across his chest. No vomiting. This pain subsided but has intermittently returned since onset. Denies any preceding sx. No recent med or food changes. No leg swelling, though he is no longer taking Xarelto (ran out of this, though it soundsl ike PE was provoked 2/2 ortho injury). No recent immobilization.  Past Medical History:  Diagnosis Date  . Hyperlipidemia   . Hypothyroid   . Vitamin D deficiency     Patient Active Problem List   Diagnosis Date Noted  . Prediabetes 03/13/2015  . Morbid obesity (HCC) 03/13/2015  . Essential hypertension 03/13/2015  . Medication management 03/13/2015  . Noncompliance 03/13/2015  . Hypothyroid   . Vitamin D deficiency   . Hyperlipidemia     History reviewed. No pertinent surgical history.     Home Medications    Prior to Admission medications   Medication Sig Start Date End Date Taking? Authorizing Provider  atorvastatin (LIPITOR) 20 MG tablet Take 1 tablet (20 mg total) by mouth daily. 06/25/16 06/25/17  Quentin MullingAmanda Collier, PA-C  levothyroxine (SYNTHROID, LEVOTHROID) 75 MCG tablet Take 1 tablet (75 mcg total) by mouth daily. 06/25/16   Quentin MullingAmanda Collier, PA-C  omeprazole (PRILOSEC) 20 MG capsule Take 1 capsule (20 mg total) by mouth daily. 11/29/16   Shaune Pollackameron Annina Piotrowski, MD  rivaroxaban (XARELTO) 20 MG TABS tablet Take 1 tablet (20 mg total) by mouth daily with supper. 07/23/16   Andrena MewsMichael D Rigby, DO    Family History Family History  Problem Relation Age of Onset  . Hypertension Mother   .  Cancer Maternal Grandfather     leukemia    Social History Social History  Substance Use Topics  . Smoking status: Current Some Day Smoker    Packs/day: 0.25    Years: 1.00    Types: Cigarettes  . Smokeless tobacco: Never Used  . Alcohol use No     Allergies   Patient has no known allergies.   Review of Systems Review of Systems  Constitutional: Positive for fatigue. Negative for chills and fever.  HENT: Negative for congestion and rhinorrhea.   Eyes: Negative for visual disturbance.  Respiratory: Negative for cough, shortness of breath and wheezing.   Cardiovascular: Positive for chest pain. Negative for leg swelling.  Gastrointestinal: Positive for abdominal pain and nausea. Negative for diarrhea and vomiting.  Genitourinary: Negative for dysuria and flank pain.  Musculoskeletal: Negative for neck pain and neck stiffness.  Skin: Negative for rash and wound.  Allergic/Immunologic: Negative for immunocompromised state.  Neurological: Negative for syncope, weakness and headaches.  All other systems reviewed and are negative.    Physical Exam Updated Vital Signs BP 158/99 (BP Location: Right Arm)   Pulse 61   Temp 98.4 F (36.9 C) (Oral)   Resp 19   Ht 6' (1.829 m)   Wt (!) 330 lb (149.7 kg)   SpO2 98%   BMI 44.76 kg/m   Physical Exam  Constitutional: He is oriented to person, place, and time.  He appears well-developed and well-nourished. No distress.  HENT:  Head: Normocephalic and atraumatic.  Eyes: Conjunctivae are normal.  Neck: Neck supple.  Cardiovascular: Normal rate, regular rhythm and normal heart sounds.  Exam reveals no friction rub.   No murmur heard. Pulmonary/Chest: Effort normal and breath sounds normal. No respiratory distress. He has no wheezes. He has no rales.  Abdominal: Soft. Normal appearance and bowel sounds are normal. He exhibits no distension. There is tenderness in the epigastric area.  Musculoskeletal: He exhibits no edema.    Neurological: He is alert and oriented to person, place, and time. He exhibits normal muscle tone.  Skin: Skin is warm. Capillary refill takes less than 2 seconds.  Psychiatric: He has a normal mood and affect.  Nursing note and vitals reviewed.    ED Treatments / Results  Labs (all labs ordered are listed, but only abnormal results are displayed) Labs Reviewed  BASIC METABOLIC PANEL - Abnormal; Notable for the following:       Result Value   Creatinine, Ser 1.26 (*)    All other components within normal limits  HEPATIC FUNCTION PANEL - Abnormal; Notable for the following:    Total Protein 6.4 (*)    All other components within normal limits  CBC WITH DIFFERENTIAL/PLATELET  D-DIMER, QUANTITATIVE (NOT AT [facility])  LIPASE, BLOOD  I-STAT TROPOININ, ED  I-STAT TROPOININ, ED    EKG  EKG Interpretation  Date/Time:  Friday November 29 2016 10:37:57 EST Ventricular Rate:  60 PR Interval:    QRS Duration: 95 QT Interval:  393 QTC Calculation: 393 R Axis:   58 Text Interpretation:  Sinus rhythm ST elev, probable normal early repol pattern No significant change since last tracing Confirmed by Eugen Jeansonne MD, Odessia Asleson [phone]) on 11/29/2016 10:40:44 AM       Radiology Dg Chest 2 View  Result Date: 11/29/2016 CLINICAL DATA:  Chest pain. EXAM: CHEST  2 VIEW COMPARISON:  03/07/2016. FINDINGS: Borderline cardiomegaly. No pulmonary venous congestion. No focal infiltrate. No pleural effusion or pneumothorax. IMPRESSION: Borderline cardiomegaly. No acute pulmonary abnormality identified . Electronically Signed   By: Maisie Fus  Register   On: 11/29/2016 13:16    Procedures Procedures (including critical care time)  Medications Ordered in ED Medications  gi cocktail (Maalox,Lidocaine,Donnatal) (30 mLs Oral Given 11/29/16 1150)  ketorolac (TORADOL) 15 MG/ML injection 15 mg (15 mg Intravenous Given 11/29/16 1150)  HYDROcodone-acetaminophen (NORCO/VICODIN) 5-325 MG per tablet 2 tablet (2 tablets Oral Given  11/29/16 1416)     Initial Impression / Assessment and Plan / ED Course  I have reviewed the triage vital signs and the nursing notes.  Pertinent labs & imaging results that were available during my care of the patient were reviewed by me and considered in my medical decision making (see chart for details).    31 year old male with past medical history of chronic chest pain here with acute on chronic chest pain and nausea. See history of present illness above. Patient also has reported history of provoked DVT but is no longer on blood thinners. On arrival, vital signs are stable. He is satting well on room air with no tachycardia. I suspect his symptoms are secondary to chest wall pain versus GERD. He did have associated nausea and epigastric pain consistent with GI etiology. No right upper quadrant tenderness to suggest cholecystitis. Screening lab work is unremarkable with normal white count, hemoglobin, renal function, and liver function. His d-dimer is negative as well, making PE unlikely. His heart score is less  than 3 and delta troponin is negative, making ACS unlikely. His symptoms are improved in the ED. Will discharge with omeprazole for possible GI related pain and discharge with outpatient follow-up.  Final Clinical Impressions(s) / ED Diagnoses   Final diagnoses:  Atypical chest pain    New Prescriptions New Prescriptions   OMEPRAZOLE (PRILOSEC) 20 MG CAPSULE    Take 1 capsule (20 mg total) by mouth daily.     Shaune Pollack, MD 11/29/16 1444

## 2017-02-06 ENCOUNTER — Ambulatory Visit (INDEPENDENT_AMBULATORY_CARE_PROVIDER_SITE_OTHER): Payer: 59 | Admitting: Internal Medicine

## 2017-02-06 VITALS — BP 136/96 | HR 64 | Temp 97.0°F | Resp 16 | Ht 71.0 in | Wt 357.6 lb

## 2017-02-06 DIAGNOSIS — Z9119 Patient's noncompliance with other medical treatment and regimen: Secondary | ICD-10-CM | POA: Diagnosis not present

## 2017-02-06 DIAGNOSIS — R7303 Prediabetes: Secondary | ICD-10-CM | POA: Diagnosis not present

## 2017-02-06 DIAGNOSIS — Z79899 Other long term (current) drug therapy: Secondary | ICD-10-CM

## 2017-02-06 DIAGNOSIS — I10 Essential (primary) hypertension: Secondary | ICD-10-CM

## 2017-02-06 DIAGNOSIS — E782 Mixed hyperlipidemia: Secondary | ICD-10-CM | POA: Diagnosis not present

## 2017-02-06 DIAGNOSIS — E039 Hypothyroidism, unspecified: Secondary | ICD-10-CM | POA: Diagnosis not present

## 2017-02-06 DIAGNOSIS — Z91199 Patient's noncompliance with other medical treatment and regimen due to unspecified reason: Secondary | ICD-10-CM

## 2017-02-06 DIAGNOSIS — E559 Vitamin D deficiency, unspecified: Secondary | ICD-10-CM

## 2017-02-06 DIAGNOSIS — R609 Edema, unspecified: Secondary | ICD-10-CM

## 2017-02-06 LAB — HEPATIC FUNCTION PANEL
ALK PHOS: 34 U/L — AB (ref 40–115)
ALT: 21 U/L (ref 9–46)
AST: 18 U/L (ref 10–40)
Albumin: 4.3 g/dL (ref 3.6–5.1)
BILIRUBIN INDIRECT: 0.2 mg/dL (ref 0.2–1.2)
Bilirubin, Direct: 0.1 mg/dL (ref <=0.2)
TOTAL PROTEIN: 6.6 g/dL (ref 6.1–8.1)
Total Bilirubin: 0.3 mg/dL (ref 0.2–1.2)

## 2017-02-06 LAB — CBC WITH DIFFERENTIAL/PLATELET
BASOS ABS: 0 {cells}/uL (ref 0–200)
BASOS PCT: 0 %
Eosinophils Absolute: 142 cells/uL (ref 15–500)
Eosinophils Relative: 2 %
HEMATOCRIT: 43 % (ref 38.5–50.0)
HEMOGLOBIN: 14.5 g/dL (ref 13.2–17.1)
LYMPHS ABS: 3053 {cells}/uL (ref 850–3900)
Lymphocytes Relative: 43 %
MCH: 28.8 pg (ref 27.0–33.0)
MCHC: 33.7 g/dL (ref 32.0–36.0)
MCV: 85.3 fL (ref 80.0–100.0)
MONO ABS: 497 {cells}/uL (ref 200–950)
MPV: 10.1 fL (ref 7.5–12.5)
Monocytes Relative: 7 %
NEUTROS ABS: 3408 {cells}/uL (ref 1500–7800)
NEUTROS PCT: 48 %
Platelets: 306 10*3/uL (ref 140–400)
RBC: 5.04 MIL/uL (ref 4.20–5.80)
RDW: 14.7 % (ref 11.0–15.0)
WBC: 7.1 10*3/uL (ref 3.8–10.8)

## 2017-02-06 LAB — BASIC METABOLIC PANEL WITH GFR
BUN: 11 mg/dL (ref 7–25)
CO2: 24 mmol/L (ref 20–31)
Calcium: 9.4 mg/dL (ref 8.6–10.3)
Chloride: 105 mmol/L (ref 98–110)
Creat: 1.31 mg/dL (ref 0.60–1.35)
GFR, EST NON AFRICAN AMERICAN: 74 mL/min (ref >=60)
GFR, Est African American: 85 mL/min (ref >=60)
GLUCOSE: 88 mg/dL (ref 65–99)
POTASSIUM: 4.1 mmol/L (ref 3.5–5.3)
Sodium: 140 mmol/L (ref 135–146)

## 2017-02-06 LAB — LIPID PANEL
Cholesterol: 188 mg/dL (ref <200)
HDL: 43 mg/dL (ref >40)
LDL CALC: 114 mg/dL — AB (ref <100)
Total CHOL/HDL Ratio: 4.4 Ratio (ref <5.0)
Triglycerides: 154 mg/dL — ABNORMAL HIGH (ref <150)
VLDL: 31 mg/dL — AB (ref <30)

## 2017-02-06 LAB — TSH: TSH: 7.56 m[IU]/L — AB (ref 0.40–4.50)

## 2017-02-06 NOTE — Patient Instructions (Signed)

## 2017-02-07 ENCOUNTER — Other Ambulatory Visit: Payer: Self-pay | Admitting: Internal Medicine

## 2017-02-07 DIAGNOSIS — E039 Hypothyroidism, unspecified: Secondary | ICD-10-CM

## 2017-02-07 DIAGNOSIS — E559 Vitamin D deficiency, unspecified: Secondary | ICD-10-CM

## 2017-02-07 LAB — VITAMIN D 25 HYDROXY (VIT D DEFICIENCY, FRACTURES): Vit D, 25-Hydroxy: 10 ng/mL — ABNORMAL LOW (ref 30–100)

## 2017-02-07 LAB — INSULIN, RANDOM: Insulin: 30.4 u[IU]/mL — ABNORMAL HIGH (ref 2.0–19.6)

## 2017-02-07 LAB — HEMOGLOBIN A1C
HEMOGLOBIN A1C: 5.6 % (ref <5.7)
MEAN PLASMA GLUCOSE: 114 mg/dL

## 2017-02-07 LAB — MAGNESIUM: Magnesium: 1.8 mg/dL (ref 1.5–2.5)

## 2017-02-07 MED ORDER — FUROSEMIDE 40 MG PO TABS: ORAL_TABLET | ORAL | 2 refills | Status: DC

## 2017-02-07 MED ORDER — LEVOTHYROXINE SODIUM 100 MCG PO TABS: ORAL_TABLET | ORAL | 1 refills | Status: DC

## 2017-02-07 MED ORDER — VITAMIN D (ERGOCALCIFEROL) 1.25 MG (50000 UNIT) PO CAPS: ORAL_CAPSULE | ORAL | 5 refills | Status: AC

## 2017-02-08 ENCOUNTER — Encounter: Payer: Self-pay | Admitting: Internal Medicine

## 2017-02-08 NOTE — Progress Notes (Signed)
This very nice but poorly compliant  31 y.o. SBM presents for  follow up with Hypertension, Hyperlipidemia, Pre-Diabetes and Vitamin D Deficiency.      Patient is treated for HTN since 2008 & compliance with meds has been very poor.  Today's BP is not at goal at 136/96. Patient has had no complaints of any cardiac type chest pain, palpitations, dyspnea/orthopnea/PND, dizziness, claudication, or dependent edema. Patient has hx/o a provoked RLE DVT in Sept 2017 and was treated with Xarelto for an undefined period of time. He has has frequent trips to ER and in  March 2018 , he had neg D-dimer and Troponins & was treated with Omeprazole. Now he presents with c/o Bilat ankle swelling. His weight is up 27# over the last 2 months.   Wt Readings from Last 3 Encounters:  02/06/17 (!) 357 lb 9.6 oz (162.2 kg)  11/29/16 (!) 330 lb (149.7 kg)  06/25/16 (!) 338 lb (153.3 kg)      Hyperlipidemia is not controlled with diet & meds. Patient denies myalgias or other med SE's. Last Lipids were  Lab Results  Component Value Date   CHOL 188 02/06/2017   HDL 43 02/06/2017   LDLCALC 114 (H) 02/06/2017   TRIG 154 (H) 02/06/2017   CHOLHDL 4.4 02/06/2017      Patient was initially dx'd with Hypothyroidism, age 89 in 2008 and he is frequently off and on therapy.  Also, the patient has history of PreDiabetes (A1c 5.8% in June 2015) and has had no symptoms of reactive hypoglycemia, diabetic polys, paresthesias or visual blurring.  Last A1c was  Lab Results  Component Value Date   HGBA1C 5.6 02/06/2017      Further, the patient also has history of Vitamin D Deficiency ("16" in 2008) and supplements vitamin D without any suspected side-effects. Current vitamin D is still very low: Lab Results  Component Value Date   VD25OH 10 (L) 02/06/2017   Current Outpatient Prescriptions on File Prior to Visit  Medication Sig  . atorvastatin  20 MG tablet Take 1 tabdaily.   Thyroxine 75 mcg  Take 1 tablet daily  .  Omeprazole 20 MG  Take 1 cap daily.   No Known Allergies   PMHx:   Past Medical History:  Diagnosis Date  . Hyperlipidemia   . Hypothyroid   . Vitamin D deficiency    Immunization History  Administered Date(s) Administered  . Tdap 03/14/2016   No past surgical history on file. FHx:    Reviewed / unchanged  SHx:    Reviewed / unchanged  Systems Review:  Constitutional: Denies fever, chills, wt changes, headaches, insomnia, fatigue, night sweats, change in appetite. Eyes: Denies redness, blurred vision, diplopia, discharge, itchy, watery eyes.  ENT: Denies discharge, congestion, post nasal drip, epistaxis, sore throat, earache, hearing loss, dental pain, tinnitus, vertigo, sinus pain, snoring.  CV: Denies chest pain, palpitations, irregular heartbeat, syncope, dyspnea, diaphoresis, orthopnea, PND, claudication or edema. Respiratory: denies cough, dyspnea, DOE, pleurisy, hoarseness, laryngitis, wheezing.  Gastrointestinal: Denies dysphagia, odynophagia, heartburn, reflux, water brash, abdominal pain or cramps, nausea, vomiting, bloating, diarrhea, constipation, hematemesis, melena, hematochezia  or hemorrhoids. Genitourinary: Denies dysuria, frequency, urgency, nocturia, hesitancy, discharge, hematuria or flank pain. Musculoskeletal: Denies arthralgias, myalgias, stiffness, jt. swelling, pain, limping or strain/sprain.  Skin: Denies pruritus, rash, hives, warts, acne, eczema or change in skin lesion(s). Neuro: No weakness, tremor, incoordination, spasms, paresthesia or pain. Psychiatric: Denies confusion, memory loss or sensory loss. Endo: Denies change  in weight, skin or hair change.  Heme/Lymph: No excessive bleeding, bruising or enlarged lymph nodes.  Physical Exam  BP (!) 136/96   Pulse 64   Temp 97 F (36.1 C)   Resp 16   Ht 5\' 11"  (1.803 m)   Wt (!) 357 lb 9.6 oz (162.2 kg)   BMI 49.88 kg/m   Appears over nourished, well groomed  and in no distress.  Eyes: PERRLA,  EOMs, conjunctiva no swelling or erythema. Sinuses: No frontal/maxillary tenderness ENT/Mouth: EAC's clear, TM's nl w/o erythema, bulging. Nares clear w/o erythema, swelling, exudates. Oropharynx clear without erythema or exudates. Oral hygiene is good. Tongue normal, non obstructing. Hearing intact.  Neck: Supple. Thyroid nl. Car 2+/2+ without bruits, nodes or JVD. Chest: Respirations nl with BS clear & equal w/o rales, rhonchi, wheezing or stridor.  Cor: Heart sounds normal w/ regular rate and rhythm without sig. murmurs, gallops, clicks or rubs. Peripheral pulses normal and equal  With 1-2 (+) pretibial edema edema. No calf tendertness. Negative Homans' Signs bilat.  Abdomen: Soft & bowel sounds normal. Non-tender w/o guarding, rebound, hernias, masses or organomegaly.  Lymphatics: Unremarkable.  Musculoskeletal: Full ROM all peripheral extremities, joint stability, 5/5 strength and normal gait.  Skin: Warm, dry without exposed rashes, lesions or ecchymosis apparent.  Neuro: Cranial nerves intact, reflexes equal bilaterally. Sensory-motor testing grossly intact. Tendon reflexes grossly intact.  Pysch: Alert & oriented x 3.  Insight and judgement nl & appropriate. No ideations.  Assessment and Plan:  1. Essential hypertension  - Start Lasix 40 mg daily & f/u OV in 2 weeks  - Continue medication, monitor blood pressure at home.  - Continue DASH diet. Reminder to go to the ER if any CP,  SOB, nausea, dizziness, severe HA, changes vision/speech,  left arm numbness and tingling and jaw pain.  - CBC with Differential/Platelet - BASIC METABOLIC PANEL WITH GFR - Magnesium - TSH  2. Hyperlipidemia, mixed  - Continue diet/meds, exercise,& lifestyle modifications.  - Continue monitor periodic cholesterol/liver & renal functions   - Hepatic function panel - Lipid panel  3. Prediabetes  - Continue diet, exercise, lifestyle modifications.  - Monitor appropriate labs.  - Hemoglobin  A1c - Insulin, random  4. Vitamin D deficiency  - Continue supplementation.  - VITAMIN D 25 Hydroxy  5. Acquired hypothyroidism   6. Edema, unspecified type  - Lasix 40 mg daily   7. Morbid obesity (HCC)   8. Medication management  - CBC with Differential/Platelet - BASIC METABOLIC PANEL WITH GFR - Hepatic function panel - Magnesium - Lipid panel - TSH - Hemoglobin A1c - Insulin, random - VITAMIN D 25 Hydroxy        Discussed  regular exercise, BP monitoring, weight control to achieve/maintain BMI less than 25 and discussed med and SE's. Recommended labs to assess and monitor clinical status with further disposition pending results of labs. Over 30 minutes of exam, counseling, chart review was performed.

## 2017-02-19 ENCOUNTER — Ambulatory Visit: Payer: Self-pay | Admitting: Internal Medicine

## 2017-02-20 ENCOUNTER — Encounter: Payer: Self-pay | Admitting: Internal Medicine

## 2017-02-20 ENCOUNTER — Ambulatory Visit (INDEPENDENT_AMBULATORY_CARE_PROVIDER_SITE_OTHER): Payer: 59 | Admitting: Internal Medicine

## 2017-02-20 VITALS — BP 136/90 | HR 76 | Temp 97.5°F | Resp 16 | Ht 71.0 in | Wt 352.2 lb

## 2017-02-20 DIAGNOSIS — Z79899 Other long term (current) drug therapy: Secondary | ICD-10-CM | POA: Diagnosis not present

## 2017-02-20 DIAGNOSIS — I10 Essential (primary) hypertension: Secondary | ICD-10-CM

## 2017-02-20 DIAGNOSIS — R609 Edema, unspecified: Secondary | ICD-10-CM | POA: Diagnosis not present

## 2017-02-20 LAB — BASIC METABOLIC PANEL WITH GFR
BUN: 11 mg/dL (ref 7–25)
CALCIUM: 9.7 mg/dL (ref 8.6–10.3)
CO2: 27 mmol/L (ref 20–31)
Chloride: 105 mmol/L (ref 98–110)
Creat: 1.45 mg/dL — ABNORMAL HIGH (ref 0.60–1.35)
GFR, EST AFRICAN AMERICAN: 75 mL/min (ref >=60)
GFR, EST NON AFRICAN AMERICAN: 65 mL/min (ref >=60)
Glucose, Bld: 88 mg/dL (ref 65–99)
Potassium: 4.2 mmol/L (ref 3.5–5.3)
SODIUM: 142 mmol/L (ref 135–146)

## 2017-02-20 LAB — CBC WITH DIFFERENTIAL/PLATELET
Basophils Absolute: 74 cells/uL (ref 0–200)
Basophils Relative: 1 %
EOS PCT: 1 %
Eosinophils Absolute: 74 cells/uL (ref 15–500)
HCT: 44.4 % (ref 38.5–50.0)
Hemoglobin: 15.4 g/dL (ref 13.2–17.1)
Lymphocytes Relative: 47 %
Lymphs Abs: 3478 cells/uL (ref 850–3900)
MCH: 29.4 pg (ref 27.0–33.0)
MCHC: 34.7 g/dL (ref 32.0–36.0)
MCV: 84.9 fL (ref 80.0–100.0)
MONOS PCT: 9 %
MPV: 10 fL (ref 7.5–12.5)
Monocytes Absolute: 666 cells/uL (ref 200–950)
NEUTROS ABS: 3108 {cells}/uL (ref 1500–7800)
Neutrophils Relative %: 42 %
PLATELETS: 332 10*3/uL (ref 140–400)
RBC: 5.23 MIL/uL (ref 4.20–5.80)
RDW: 14.4 % (ref 11.0–15.0)
WBC: 7.4 10*3/uL (ref 3.8–10.8)

## 2017-02-20 LAB — MAGNESIUM: MAGNESIUM: 2 mg/dL (ref 1.5–2.5)

## 2017-02-20 MED ORDER — FUROSEMIDE 80 MG PO TABS: ORAL_TABLET | ORAL | 2 refills | Status: DC

## 2017-02-20 NOTE — Progress Notes (Signed)
  Subjective:    Patient ID: David Tanner, male    DOB: 12-07-1985, 31 y.o.   MRN: [id number]  HPI   Patient is a nice 31 yo Massively obese single BM with HTN,HLD, GERD, PreDM who returns for f/u of adding Lasix for worsening edema. Patient also has hx/o provoked  RLE DVT in Sept 2017 treated w/Xarelto. Patient had gained 27# over the preceding 2 months and since on lasix the last 2 weeks he has lost 5#.  Wt Readings from Last 3 Encounters:  02/20/17 (!) 352 lb 3.2 oz (159.8 kg)  02/06/17 (!) 357 lb 9.6 oz (162.2 kg)  11/29/16 (!) 330 lb (149.7 kg)   Medication Sig  . atorvastatin  20 MG tablet Take 1 tablet (20 mg total) by mouth daily.  . furosemide  40 MG tablet Take 1 tablet daily for fluid retention  . levothyroxine  100 MCG tablet Take 1 tablet every morning on an empty stomach for 30 min  . omeprazole  20 MG capsule Take 1 capsule (20 mg total) by mouth daily.  . Vitamin D 7846950000 units  Take 1 capsule daily for severe Vitamin Deficiency  . rivaroxaban (XARELTO) 20 MG  Patient not taking: Reported on 02/06/2017   Past Medical History:  Diagnosis Date  . Hyperlipidemia   . Hypothyroid   . Vitamin D deficiency    Review of Systems  10 point systems review negative except as above.    Objective:   Physical Exam  BP 136/90   Pulse 76   Temp 97.5 F (36.4 C)   Resp 16   Ht 5\' 11"  (1.803 m)   Wt (!) 352 lb 3.2 oz (159.8 kg)   BMI 49.12 kg/m   HEENT - WNL. Neck - supple.  Chest - Clear equal BS. Cor - Nl HS. RRR w/o sig MGR. PP obscured by  1-2 (+) pretibial/ankle edema /  (-) Homans' sn /  (-) calf compression. MS- FROM w/o deformities.  Gait Nl. Neuro -  Nl w/o focal abnormalities.    Assessment & Plan:   1. Essential hypertension   2. Edema, venous Insufficiency  - increase Lasix 80 mg #30 - take 1 tab daily  3. Medication management  - BASIC METABOLIC PANEL WITH GFR - CBC with Differential/Platelet - Magnesium  - discussed prudent low salt diet and  exercise

## 2017-03-17 NOTE — Progress Notes (Deleted)
Complete Physical  Assessment and Plan:  Prediabetes Discussed general issues about diabetes pathophysiology and management., Educational material distributed., Suggested low cholesterol diet., Encouraged aerobic exercise., Discussed foot care., Reminded to get yearly retinal exam. - Hemoglobin A1c - Insulin, fasting   Morbid obesity Obesity with co morbidities- long discussion about weight loss, diet, and exercise - Testosterone   Hyperlipidemia -continue medications, check lipids, decrease fatty foods, increase activity.  - atorvastatin (LIPITOR) 20 MG tablet; Take 1 tablet (20 mg total) by mouth daily.  Dispense: 90 tablet; Refill: 0 - Lipid panel   Hypothyroidism, unspecified hypothyroidism type Hypothyroidism-check TSH level, will put in refill of medication after results, reminded to take on an empty stomach 30-10mins before food.  - TSH   Essential hypertension - CBC with Differential/Platelet - BASIC METABOLIC PANEL WITH GFR - Hepatic function panel - Urinalysis, Routine w reflex microscopic (not at [facility]) - Microalbumin / creatinine urine ratio   Vitamin D deficiency - Vit D  25 hydroxy (rtn osteoporosis monitoring)   Medication management - Magnesium   Routine general medical examination at a health care facility Discussed STD testing, safe sex, alcohol and drug awareness, drinking and driving dangers, wearing a seat belt and general safety measures for young adult.   Noncompliance Noncompliance- emphasized compliance with medications and appointments, patient expressed understanding of risks.    Anemia, unspecified anemia type - Iron and TIBC - Ferritin  Discussed med's effects and SE's. Screening labs and tests as requested with regular follow-up as recommended.  HPI 31 y.o. male  presents for a complete physical. He has a history of noncompliance for appointments in the past and has not been taking his medications for 3-6 months.  His blood pressure has  been controlled at home, today their BP is   He does workout but works hard at work at [employer], has 2 little girls, no longer on probation. He denies dizziness.  He also admits to being anxious/depressed from court hearings, has been thinking a lot, no thought of SI/HI.  He is on cholesterol medication, he is out of his medications and denies myalgias. His cholesterol is at goal. The cholesterol last visit was:   Lab Results  Component Value Date   CHOL 188 02/06/2017   HDL 43 02/06/2017   LDLCALC 114 (H) 02/06/2017   TRIG 154 (H) 02/06/2017   CHOLHDL 4.4 02/06/2017   He has been working on diet and exercise for prediabetes, and denies paresthesia of the feet, polydipsia and polyuria. Last A1C in the office was:  Lab Results  Component Value Date   HGBA1C 5.6 02/06/2017   Patient is on Vitamin D supplement. Lab Results  Component Value Date   VD25OH 10 (L) 02/06/2017   He is on thyroid medication. He is on 1.5 T/T and 1 pill the rest of the days, however he has been out of it for over 6 months. Patient denies nervousness, palpitations and weight changes.  Lab Results  Component Value Date   TSH 7.56 (H) 02/06/2017   BMI is There is no height or weight on file to calculate BMI., he is working on diet and exercise. Wt Readings from Last 3 Encounters:  02/20/17 (!) 352 lb 3.2 oz (159.8 kg)  02/06/17 (!) 357 lb 9.6 oz (162.2 kg)  11/29/16 (!) 330 lb (149.7 kg)     Current Medications:  Current Outpatient Prescriptions on File Prior to Visit  Medication Sig Dispense Refill  . aspirin EC 81 MG tablet Take 81 mg  by mouth 2 (two) times daily.    Marland Kitchen. atorvastatin (LIPITOR) 20 MG tablet Take 1 tablet (20 mg total) by mouth daily. 90 tablet 0  . furosemide (LASIX) 80 MG tablet Take 1 tablet daily for fluid retention 30 tablet 2  . levothyroxine (SYNTHROID, LEVOTHROID) 100 MCG tablet Take 1 tablet every morning on an empty stomach for 30 minutes 90 tablet 1  . omeprazole (PRILOSEC) 20 MG  capsule Take 1 capsule (20 mg total) by mouth daily. 10 capsule 0  . Vitamin D, Ergocalciferol, (DRISDOL) 50000 units CAPS capsule Take 1 capsule daily for severe Vitamin Deficiency 30 capsule 5   No current facility-administered medications on file prior to visit.    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 Hyperlipidemia   . Hypothyroid   . Vitamin D deficiency    Allergies Not on File  SURGICAL HISTORY He  has no past surgical history on file. FAMILY HISTORY His family history includes Cancer in his maternal grandfather; Hypertension in his mother. SOCIAL HISTORY He  reports that he has been smoking Cigarettes.  He has a 0.25 pack-year smoking history. He has never used smokeless tobacco. He reports that he does not drink alcohol or use drugs.   Review of Systems  Constitutional: Negative.   HENT: Negative.   Eyes: Negative.   Respiratory: Positive for shortness of breath. Negative for cough, hemoptysis, sputum production and wheezing.   Cardiovascular: Positive for chest pain. Negative for palpitations, orthopnea, claudication, leg swelling and PND.  Gastrointestinal: Negative.   Genitourinary: Negative.   Musculoskeletal: Negative for back pain, falls, joint pain, myalgias and neck pain.  Skin: Negative.   Neurological: Negative.   Endo/Heme/Allergies: Negative.   Psychiatric/Behavioral: Negative.     Physical Exam: Estimated body mass index is 49.12 kg/m as calculated from the following:   Height as of 02/20/17: 5\' 11"  (1.803 m).   Weight as of 02/20/17: 352 lb 3.2 oz (159.8 kg). There were no vitals taken for this visit. General  Appearance: Well nourished, in no apparent distress. Eyes: PERRLA, EOMs, conjunctiva no swelling or erythema, normal fundi and vessels. Sinuses: No Frontal/maxillary tenderness ENT/Mouth: Ext aud canals clear, normal light reflex with TMs without erythema, bulging. Good dentition. No erythema, swelling, or exudate on post pharynx. Tonsils not swollen or erythematous. Hearing normal.  Neck: Supple, thyroid normal. No bruits Respiratory: Respiratory effort normal, BS equal bilaterally without rales, rhonchi, wheezing or stridor. Cardio: RRR with occ PVCs without murmurs, rubs or gallops. Brisk peripheral pulses without edema.  Chest: symmetric, with normal excursions and percussion. Abdomen: Soft, +BS, obese, Non tender, no guarding, rebound, hernias, masses, or organomegaly. .  Lymphatics: Non tender without lymphadenopathy.  Genitourinary: defer Musculoskeletal: Full ROM all peripheral extremities,5/5 strength, and normal gait.  Skin: Warm, dry without rashes, lesions, ecchymosis.  Neuro: Cranial nerves intact, reflexes equal bilaterally. Normal muscle tone, no cerebellar symptoms. Sensation intact.  Psych: Awake and oriented X 3, normal affect, Insight and Judgment appropriate.   Quentin MullingAmanda Collier 6:54 AM

## 2017-03-18 ENCOUNTER — Encounter: Payer: Self-pay | Admitting: Physician Assistant

## 2017-05-19 NOTE — Progress Notes (Signed)
Complete Physical  Assessment and Plan: Encounter for general adult medical examination with abnormal findings 1 year  Essential hypertension - continue medications, DASH diet, exercise and monitor at home. Call if greater than 130/80.  -     CBC with Differential/Platelet -     BASIC METABOLIC PANEL WITH GFR -     Hepatic function panel -     TSH -     Urinalysis, Routine w reflex microscopic -     Microalbumin / creatinine urine ratio -     EKG 12-Lead  Hypothyroidism, unspecified type Hypothyroidism-check TSH level, has not been taking meds x 1-2 months -     TSH  Hyperlipidemia, unspecified hyperlipidemia type - pending levels may or may not restart meds with weight loss, check lipids, decrease fatty foods, increase activity.  -     Lipid panel  Prediabetes Discussed general issues about diabetes pathophysiology and management., Educational material distributed., Suggested low cholesterol diet., Encouraged aerobic exercise., Discussed foot care., Reminded to get yearly retinal exam. -     Hemoglobin A1c  Medication management -     Magnesium  Obesity, morbid, BMI 50 or higher (HCC) -     phentermine (ADIPEX-P) 37.5 MG tablet; Take 1 tablet (37.5 mg total) by mouth daily before breakfast. Weight loss discussed  Vitamin D deficiency -     VITAMIN D 25 Hydroxy (Vit-D Deficiency, Fractures)  Noncompliance 45 mins late for appt, counseled on keeping OV's  Acquired hypothyroidism -     levothyroxine (SYNTHROID, LEVOTHROID) 100 MCG tablet; Take 1 tablet every morning on an empty stomach for 30 minutes   Discussed med's effects and SE's. Screening labs and tests as requested with regular follow-up as recommended.  HPI 31 y.o. AA male  presents for a complete physical. He has a history of noncompliance for appointments and medications.  His blood pressure has been controlled at home, today their BP is BP: 120/86 He does workout, lost job at [employer], has 2 little girls, preK  and K, dating same girl, no longer on probation. He denies dizziness.  He is on cholesterol medication, he is out of his medications and denies myalgias. His cholesterol is at goal. The cholesterol last visit was:   Lab Results  Component Value Date   CHOL 188 02/06/2017   HDL 43 02/06/2017   LDLCALC 114 (H) 02/06/2017   TRIG 154 (H) 02/06/2017   CHOLHDL 4.4 02/06/2017   He has been working on diet and exercise for prediabetes, and denies paresthesia of the feet, polydipsia and polyuria. Last A1C in the office was:  Lab Results  Component Value Date   HGBA1C 5.6 02/06/2017   Patient is on Vitamin D supplement. Lab Results  Component Value Date   VD25OH 10 (L) 02/06/2017   He is on thyroid medication. He is on 1.5 T/T and 1 pill the rest of the days, however he has been out of it for over 6 months. Patient denies nervousness, palpitations and weight changes.  Lab Results  Component Value Date   TSH 7.56 (H) 02/06/2017   BMI is Body mass index is 45.43 kg/m., he is working on diet and exercise. He has been on phentermine.  Wt Readings from Last 3 Encounters:  05/20/17 (!) 335 lb (152 kg)  02/20/17 (!) 352 lb 3.2 oz (159.8 kg)  02/06/17 (!) 357 lb 9.6 oz (162.2 kg)     Current Medications:  Current Outpatient Prescriptions on File Prior to Visit  Medication Sig  Dispense Refill  . aspirin EC 81 MG tablet Take 81 mg by mouth 2 (two) times daily.    Marland Kitchen atorvastatin (LIPITOR) 20 MG tablet Take 1 tablet (20 mg total) by mouth daily. 90 tablet 0  . furosemide (LASIX) 80 MG tablet Take 1 tablet daily for fluid retention 30 tablet 2  . levothyroxine (SYNTHROID, LEVOTHROID) 100 MCG tablet Take 1 tablet every morning on an empty stomach for 30 minutes 90 tablet 1  . omeprazole (PRILOSEC) 20 MG capsule Take 1 capsule (20 mg total) by mouth daily. 10 capsule 0   No current facility-administered medications on file prior to visit.    Health Maintenance:  Immunization History   Administered Date(s) Administered  . 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 Hyperlipidemia   . Hypothyroid   . Vitamin D deficiency    Allergies Not on File  SURGICAL HISTORY He  has no past surgical history on file. FAMILY HISTORY His family history includes Cancer in his maternal grandfather; Hypertension in his mother. SOCIAL HISTORY He  reports that he has quit smoking. His smoking use included Cigarettes. He has a 0.25 pack-year smoking history. He has never used smokeless tobacco. He reports that he does not drink alcohol or use drugs.   Review of Systems  Constitutional: Negative.   HENT: Negative.   Eyes: Negative.   Respiratory: Negative for cough, hemoptysis, sputum production, shortness of breath and wheezing.   Cardiovascular: Negative for chest pain, palpitations, orthopnea, claudication, leg swelling and PND.  Gastrointestinal: Negative.   Genitourinary: Negative.   Musculoskeletal: Negative for back pain, falls, joint pain, myalgias and neck pain.  Skin: Negative.   Neurological: Negative.   Endo/Heme/Allergies: Negative.   Psychiatric/Behavioral: Negative.     Physical Exam: Estimated body mass index is 45.43 kg/m as calculated from the following:   Height as of this encounter: 6' (1.829 m).   Weight as of this encounter: 335 lb (152 kg). BP 120/86   Pulse 75   Temp (!) 97.3 F (36.3 C)   Resp 16   Ht 6' (1.829 m)   Wt (!) 335 lb (152 kg)   SpO2 97%   BMI 45.43 kg/m  General Appearance: Well nourished, in no apparent distress. Eyes: PERRLA, EOMs, conjunctiva no swelling or erythema, normal fundi and vessels. Sinuses: No  Frontal/maxillary tenderness ENT/Mouth: Ext aud canals clear, normal light reflex with TMs without erythema, bulging. Good dentition. No erythema, swelling, or exudate on post pharynx. Tonsils not swollen or erythematous. Hearing normal.  Neck: Supple, thyroid normal. No bruits Respiratory: Respiratory effort normal, BS equal bilaterally without rales, rhonchi, wheezing or stridor. Cardio: RRR with occ PVCs without murmurs, rubs or gallops. Brisk peripheral pulses without edema.  Chest: symmetric, with normal excursions and percussion. Abdomen: Soft, +BS, obese, Non tender, no guarding, rebound, hernias, masses, or organomegaly. .  Lymphatics: Non tender without lymphadenopathy.  Genitourinary: defer Musculoskeletal: Full ROM all peripheral extremities,5/5 strength, and normal gait.  Skin: Warm, dry without rashes, lesions, ecchymosis.  Neuro: Cranial nerves intact, reflexes equal bilaterally. Normal muscle tone, no cerebellar symptoms. Sensation intact.  Psych: Awake and oriented X 3, normal affect, Insight and Judgment appropriate.  Quentin Mulling 12:04 PM

## 2017-05-20 ENCOUNTER — Encounter: Payer: Self-pay | Admitting: Physician Assistant

## 2017-05-20 ENCOUNTER — Ambulatory Visit (INDEPENDENT_AMBULATORY_CARE_PROVIDER_SITE_OTHER): Payer: 59 | Admitting: Physician Assistant

## 2017-05-20 ENCOUNTER — Other Ambulatory Visit: Payer: Self-pay | Admitting: Internal Medicine

## 2017-05-20 VITALS — BP 120/86 | HR 75 | Temp 97.3°F | Resp 16 | Ht 72.0 in | Wt 335.0 lb

## 2017-05-20 DIAGNOSIS — Z9119 Patient's noncompliance with other medical treatment and regimen: Secondary | ICD-10-CM | POA: Diagnosis not present

## 2017-05-20 DIAGNOSIS — Z86718 Personal history of other venous thrombosis and embolism: Secondary | ICD-10-CM

## 2017-05-20 DIAGNOSIS — Z91199 Patient's noncompliance with other medical treatment and regimen due to unspecified reason: Secondary | ICD-10-CM

## 2017-05-20 DIAGNOSIS — E785 Hyperlipidemia, unspecified: Secondary | ICD-10-CM

## 2017-05-20 DIAGNOSIS — R7303 Prediabetes: Secondary | ICD-10-CM | POA: Diagnosis not present

## 2017-05-20 DIAGNOSIS — Z0001 Encounter for general adult medical examination with abnormal findings: Secondary | ICD-10-CM | POA: Diagnosis not present

## 2017-05-20 DIAGNOSIS — E559 Vitamin D deficiency, unspecified: Secondary | ICD-10-CM

## 2017-05-20 DIAGNOSIS — E039 Hypothyroidism, unspecified: Secondary | ICD-10-CM

## 2017-05-20 DIAGNOSIS — I10 Essential (primary) hypertension: Secondary | ICD-10-CM | POA: Diagnosis not present

## 2017-05-20 DIAGNOSIS — Z79899 Other long term (current) drug therapy: Secondary | ICD-10-CM

## 2017-05-20 MED ORDER — LEVOTHYROXINE SODIUM 100 MCG PO TABS: ORAL_TABLET | ORAL | 1 refills | Status: DC

## 2017-05-20 MED ORDER — PHENTERMINE HCL 37.5 MG PO TABS: 37.5000 mg | ORAL_TABLET | Freq: Every day | ORAL | 1 refills | Status: DC

## 2017-05-20 NOTE — Patient Instructions (Addendum)
STAY ON VITAMIN D STAY ON THYROID MEDICATION  Thyroid level is not in range. Please make sure you are taking your thyroid medication 60 mins before food with just water. Do not take it within 4 hours of calcium, magnesium, stomach medications like tums, zantac, prilosec for example.    Hypothyroidism can cause slow metabolism, fatigue, weight gain, dry hair/skin, constipation, swelling, decreased memory/brain fog.   Stay on low dose aspirin  Phentermine  While taking the medication we may ask that you come into the office once a month or once every 2-3 months to monitor your weight, blood pressure, and heart rate. In addition we can help answer your questions about diet, exercise, and help you every step of the way with your weight loss journey. Sometime it is helpful if you bring in a food diary or use an app on your phone such as myfitnesspal to record your calorie intake, especially in the beginning.   You can start out on 1/3 to 1/2 a pill in the morning and if you are tolerating it well you can increase to one pill daily. I also have some patients that take 1/3 or 1/2 at lunch to help prevent night time eating.  This medication is cheapest CASH pay at [facility] OR [facility] OR [facility] is 14-17 dollars and you do NOT need a membership to get meds from there.    What is this medicine? PHENTERMINE (FEN ter meen) decreases your appetite. This medicine is intended to be used in addition to a healthy reduced calorie diet and exercise. The best results are achieved this way. This medicine is only indicated for short-term use. Eventually your weight loss may level out and the medication will no longer be needed.   How should I use this medicine? Take this medicine by mouth. Follow the directions on the prescription label. The tablets should stay in the bottle until immediately before you take your dose. Take your doses at regular intervals. Do not take your medicine more often than directed.   Overdosage: If you think you have taken too much of this medicine contact a poison control center or emergency room at once. NOTE: This medicine is only for you. Do not share this medicine with others.  What if I miss a dose? If you miss a dose, take it as soon as you can. If it is almost time for your next dose, take only that dose. Do not take double or extra doses. Do not increase or in any way change your dose without consulting your doctor.  What should I watch for while using this medicine? Notify your physician immediately if you become short of breath while doing your normal activities. Do not take this medicine within 6 hours of bedtime. It can keep you from getting to sleep. Avoid drinks that contain caffeine and try to stick to a regular bedtime every night. Do not stand or sit up quickly, especially if you are an older patient. This reduces the risk of dizzy or fainting spells. Avoid alcoholic drinks.  What side effects may I notice from receiving this medicine? Side effects that you should report to your doctor or health care professional as soon as possible: -chest pain, palpitations -depression or severe changes in mood -increased blood pressure -irritability -nervousness or restlessness -severe dizziness -shortness of breath -problems urinating -unusual swelling of the legs -vomiting  Side effects that usually do not require medical attention (report to your doctor or health care professional if they continue  or are bothersome): -blurred vision or other eye problems -changes in sexual ability or desire -constipation or diarrhea -difficulty sleeping -dry mouth or unpleasant taste -headache -nausea This list may not describe all possible side effects. Call your doctor for medical advice about side effects. You may report side effects to FDA at 1-800-FDA-1088.     Bad carbs also include fruit juice, alcohol, and sweet tea. These are empty calories that do not signal  to your brain that you are full.   Please remember the good carbs are still carbs which convert into sugar. So please measure them out no more than 1/2-1 cup of rice, oatmeal, pasta, and beans  Veggies are however free foods! Pile them on.   Not all fruit is created equal. Please see the list below, the fruit at the bottom is higher in sugars than the fruit at the top. Please avoid all dried fruits.     Vitamin D goal is between 60-80  Please make sure that you are taking your Vitamin D as directed.   It is very important as a natural anti-inflammatory   helping hair, skin, and nails, as well as reducing stroke and heart attack risk.   It helps your bones and helps with mood.  We want you on at least 5000 IU daily  It also decreases numerous cancer risks so please take it as directed.   Low Vit D is associated with a 200-300% higher risk for CANCER   and 200-300% higher risk for HEART   ATTACK  &  STROKE.    .....................................Marland Kitchen  It is also associated with higher death rate at younger ages,   autoimmune diseases like Rheumatoid arthritis, Lupus, Multiple Sclerosis.     Also many other serious conditions, like depression, Alzheimer's  Dementia, infertility, muscle aches, fatigue, fibromyalgia - just to name a few.  +++++++++++++++++++  Can get liquid vitamin D from [place]  OR here in [place] at  [facility] alternatives [address] [place], [place] [address] Or you can try [facility] [employer]

## 2017-05-21 LAB — BASIC METABOLIC PANEL WITH GFR
BUN / CREAT RATIO: 8 (calc) (ref 6–22)
BUN: 11 mg/dL (ref 7–25)
CHLORIDE: 105 mmol/L (ref 98–110)
CO2: 28 mmol/L (ref 20–32)
Calcium: 9.5 mg/dL (ref 8.6–10.3)
Creat: 1.43 mg/dL — ABNORMAL HIGH (ref 0.60–1.35)
GFR, Est African American: 76 mL/min/{1.73_m2} (ref >=60)
GFR, Est Non African American: 66 mL/min/{1.73_m2} (ref >=60)
GLUCOSE: 91 mg/dL (ref 65–99)
Potassium: 4 mmol/L (ref 3.5–5.3)
SODIUM: 142 mmol/L (ref 135–146)

## 2017-05-21 LAB — CBC WITH DIFFERENTIAL/PLATELET
BASOS PCT: 0.6 %
Basophils Absolute: 42 cells/uL (ref 0–200)
Eosinophils Absolute: 119 cells/uL (ref 15–500)
Eosinophils Relative: 1.7 %
HCT: 44.3 % (ref 38.5–50.0)
Hemoglobin: 14.8 g/dL (ref 13.2–17.1)
Lymphs Abs: 3136 cells/uL (ref 850–3900)
MCH: 28.3 pg (ref 27.0–33.0)
MCHC: 33.4 g/dL (ref 32.0–36.0)
MCV: 84.7 fL (ref 80.0–100.0)
MONOS PCT: 8.5 %
MPV: 10.9 fL (ref 7.5–12.5)
Neutro Abs: 3108 cells/uL (ref 1500–7800)
Neutrophils Relative %: 44.4 %
PLATELETS: 328 10*3/uL (ref 140–400)
RBC: 5.23 10*6/uL (ref 4.20–5.80)
RDW: 14.1 % (ref 11.0–15.0)
TOTAL LYMPHOCYTE: 44.8 %
WBC mixed population: 595 cells/uL (ref 200–950)
WBC: 7 10*3/uL (ref 3.8–10.8)

## 2017-05-21 LAB — LIPID PANEL
CHOL/HDL RATIO: 4.8 (calc) (ref <5.0)
CHOLESTEROL: 221 mg/dL — AB (ref <200)
HDL: 46 mg/dL (ref >40)
LDL Cholesterol (Calc): 144 mg/dL (calc) — ABNORMAL HIGH
Non-HDL Cholesterol (Calc): 175 mg/dL (calc) — ABNORMAL HIGH (ref <130)
Triglycerides: 178 mg/dL — ABNORMAL HIGH (ref <150)

## 2017-05-21 LAB — URINE CULTURE
MICRO NUMBER:: 80908767
RESULT: NO GROWTH
SPECIMEN QUALITY:: ADEQUATE

## 2017-05-21 LAB — MICROALBUMIN / CREATININE URINE RATIO
Creatinine, Urine: 500 mg/dL — ABNORMAL HIGH (ref 20–370)
MICROALB UR: 1.8 mg/dL
MICROALB/CREAT RATIO: 4 ug/mg{creat} (ref <30)

## 2017-05-21 LAB — URINALYSIS, ROUTINE W REFLEX MICROSCOPIC
BACTERIA UA: NONE SEEN /HPF
Bilirubin Urine: NEGATIVE
GLUCOSE, UA: NEGATIVE
HYALINE CAST: NONE SEEN /LPF
Hgb urine dipstick: NEGATIVE
Ketones, ur: NEGATIVE
Nitrite: NEGATIVE
RBC / HPF: NONE SEEN /HPF (ref 0–2)
Specific Gravity, Urine: 1.027 (ref 1.001–1.03)
pH: 6 (ref 5.0–8.0)

## 2017-05-21 LAB — HEMOGLOBIN A1C
HEMOGLOBIN A1C: 5.5 %{Hb} (ref <5.7)
MEAN PLASMA GLUCOSE: 111 (calc)
eAG (mmol/L): 6.2 (calc)

## 2017-05-21 LAB — HEPATIC FUNCTION PANEL
AG Ratio: 1.8 (calc) (ref 1.0–2.5)
ALT: 27 U/L (ref 9–46)
AST: 26 U/L (ref 10–40)
Albumin: 4.3 g/dL (ref 3.6–5.1)
Alkaline phosphatase (APISO): 47 U/L (ref 40–115)
BILIRUBIN DIRECT: 0.1 mg/dL (ref 0.0–0.2)
BILIRUBIN INDIRECT: 0.5 mg/dL (ref 0.2–1.2)
Globulin: 2.4 g/dL (calc) (ref 1.9–3.7)
TOTAL PROTEIN: 6.7 g/dL (ref 6.1–8.1)
Total Bilirubin: 0.6 mg/dL (ref 0.2–1.2)

## 2017-05-21 LAB — TSH: TSH: 7 m[IU]/L — AB (ref 0.40–4.50)

## 2017-05-21 LAB — MAGNESIUM: Magnesium: 2.1 mg/dL (ref 1.5–2.5)

## 2017-05-21 LAB — VITAMIN D 25 HYDROXY (VIT D DEFICIENCY, FRACTURES): VIT D 25 HYDROXY: 18 ng/mL — AB (ref 30–100)

## 2017-05-22 ENCOUNTER — Other Ambulatory Visit: Payer: Self-pay | Admitting: Physician Assistant

## 2017-05-22 DIAGNOSIS — R809 Proteinuria, unspecified: Secondary | ICD-10-CM

## 2017-05-22 DIAGNOSIS — N289 Disorder of kidney and ureter, unspecified: Secondary | ICD-10-CM

## 2017-06-03 ENCOUNTER — Emergency Department (HOSPITAL_COMMUNITY)
Admission: EM | Admit: 2017-06-03 | Discharge: 2017-06-03 | Disposition: A | Discharge status: Home / Self Care | Payer: BLUE CROSS/BLUE SHIELD | Attending: Emergency Medicine | Admitting: Emergency Medicine

## 2017-06-03 ENCOUNTER — Emergency Department (HOSPITAL_COMMUNITY): Payer: BLUE CROSS/BLUE SHIELD

## 2017-06-03 ENCOUNTER — Encounter (HOSPITAL_COMMUNITY): Payer: Self-pay | Admitting: Emergency Medicine

## 2017-06-03 DIAGNOSIS — S63273A Dislocation of unspecified interphalangeal joint of left middle finger, initial encounter: Secondary | ICD-10-CM | POA: Insufficient documentation

## 2017-06-03 DIAGNOSIS — Y9389 Activity, other specified: Secondary | ICD-10-CM | POA: Insufficient documentation

## 2017-06-03 DIAGNOSIS — Y9289 Other specified places as the place of occurrence of the external cause: Secondary | ICD-10-CM | POA: Insufficient documentation

## 2017-06-03 DIAGNOSIS — Z7982 Long term (current) use of aspirin: Secondary | ICD-10-CM | POA: Insufficient documentation

## 2017-06-03 DIAGNOSIS — S63259A Unspecified dislocation of unspecified finger, initial encounter: Secondary | ICD-10-CM

## 2017-06-03 DIAGNOSIS — W0110XA Fall on same level from slipping, tripping and stumbling with subsequent striking against unspecified object, initial encounter: Secondary | ICD-10-CM | POA: Insufficient documentation

## 2017-06-03 DIAGNOSIS — Z720 Tobacco use: Secondary | ICD-10-CM | POA: Insufficient documentation

## 2017-06-03 DIAGNOSIS — E039 Hypothyroidism, unspecified: Secondary | ICD-10-CM | POA: Insufficient documentation

## 2017-06-03 DIAGNOSIS — I10 Essential (primary) hypertension: Secondary | ICD-10-CM | POA: Insufficient documentation

## 2017-06-03 DIAGNOSIS — Z79899 Other long term (current) drug therapy: Secondary | ICD-10-CM | POA: Insufficient documentation

## 2017-06-03 DIAGNOSIS — Y990 Civilian activity done for income or pay: Secondary | ICD-10-CM | POA: Insufficient documentation

## 2017-06-03 DIAGNOSIS — Z86718 Personal history of other venous thrombosis and embolism: Secondary | ICD-10-CM | POA: Insufficient documentation

## 2017-06-03 MED ORDER — LIDOCAINE HCL (PF) 1 % IJ SOLN
INTRAMUSCULAR | Status: AC
  Filled 2017-06-03: qty 30

## 2017-06-03 MED ORDER — MORPHINE SULFATE 15 MG PO TABS
15.0000 mg | ORAL_TABLET | ORAL | 0 refills | Status: DC | PRN
Start: 2017-06-03 — End: 2021-06-14

## 2017-06-03 MED ORDER — OXYCODONE-ACETAMINOPHEN 5-325 MG PO TABS: 1.0000 | ORAL_TABLET | ORAL | Status: DC | PRN

## 2017-06-03 NOTE — ED Notes (Signed)
Pt reports he was outside smoking, tripped on something and fell.  Attempted to catch his fall with his L hand.  Reports L middle finger injury.  Obvious deformity noted.

## 2017-06-03 NOTE — ED Notes (Signed)
Bed: [facility] Expected date:  Expected time:  Means of arrival:  Comments: 

## 2017-06-03 NOTE — Discharge Instructions (Signed)
Take 4 over the counter ibuprofen tablets 3 times a day or 2 over-the-counter naproxen tablets twice a day for pain. Also take tylenol 1000mg(2 extra strength) four times a day.    

## 2017-06-03 NOTE — ED Notes (Signed)
[facility] tech contacted to apply finger splint.

## 2017-06-03 NOTE — ED Provider Notes (Addendum)
[facility]-EMERGENCY DEPT Provider Note   CSN: [id number] Arrival date & time: 06/03/17  1856     History   Chief Complaint Chief Complaint  Patient presents with  . Fall  . Finger Injury    HPI David Tanner is a 31 y.o. male.  14 yoM with a chief complaint of a fall. Patient was going outside to smoke at work and tripped and fell outside. Landed on outstretched hand.Had some pain to his left middle finger. Noted that it was significantly swollen and deformed.  Came to the ED.  Sharp shooting, denies radiation, denies laceration.  Worse with movement and palpation.    The history is provided by the patient.  Fall  This is a new problem. The current episode started less than 1 hour ago. The problem occurs rarely. The problem has been resolved. Pertinent negatives include no chest pain, no abdominal pain, no headaches and no shortness of breath. The symptoms are aggravated by bending and twisting. Nothing relieves the symptoms. He has tried nothing for the symptoms. The treatment provided no relief.    Past Medical History:  Diagnosis Date  . Hyperlipidemia   . Hypothyroid   . Vitamin D deficiency     Patient Active Problem List   Diagnosis Date Noted  . History of DVT (deep vein thrombosis) 05/20/2017  . Obesity, morbid, BMI 50 or higher (HCC) 02/08/2017  . Prediabetes 03/13/2015  . Essential hypertension 03/13/2015  . Medication management 03/13/2015  . Noncompliance 03/13/2015  . Hypothyroid   . Vitamin D deficiency   . Hyperlipidemia     History reviewed. No pertinent surgical history.     Home Medications    Prior to Admission medications   Medication Sig Start Date End Date Taking? Authorizing Provider  aspirin EC 81 MG tablet Take 81 mg by mouth 2 (two) times daily.    [provider]  furosemide (LASIX) 80 MG tablet Take 1 tablet daily for fluid retention 02/20/17 05/23/17  [name], MD  levothyroxine (SYNTHROID, LEVOTHROID) 100 MCG tablet  Take 1 tablet every morning on an empty stomach for 30 minutes 05/20/17 11/20/17  Quentin Mulling, PA-C  morphine (MSIR) 15 MG tablet Take 1 tablet (15 mg total) by mouth every 4 (four) hours as needed for severe pain. 06/03/17   Melene Plan, DO  phentermine (ADIPEX-P) 37.5 MG tablet Take 1 tablet (37.5 mg total) by mouth daily before breakfast. 05/20/17   Quentin Mulling, PA-C    Family History Family History  Problem Relation Age of Onset  . Hypertension Mother   . Cancer Maternal Grandfather        leukemia    Social History Social History  Substance Use Topics  . Smoking status: Former Smoker    Packs/day: 0.25    Years: 1.00    Types: Cigarettes  . Smokeless tobacco: Never Used  . Alcohol use No     Allergies   Patient has no known allergies.   Review of Systems Review of Systems  Constitutional: Negative for chills and fever.  HENT: Negative for congestion and facial swelling.   Eyes: Negative for discharge and visual disturbance.  Respiratory: Negative for shortness of breath.   Cardiovascular: Negative for chest pain and palpitations.  Gastrointestinal: Negative for abdominal pain, diarrhea and vomiting.  Musculoskeletal: Positive for arthralgias and myalgias.  Skin: Negative for color change and rash.  Neurological: Negative for tremors, syncope and headaches.  Psychiatric/Behavioral: Negative for confusion and dysphoric mood.  Physical Exam Updated Vital Signs BP 129/83 (BP Location: Right Arm)   Pulse 99   Temp 98.6 F (37 C) (Oral)   Resp 20   Ht 6' (1.829 m)   Wt (!) 152 kg (335 lb)   SpO2 95%   BMI 45.43 kg/m   Physical Exam  Constitutional: He is oriented to person, place, and time. He appears well-developed and well-nourished.  HENT:  Head: Normocephalic and atraumatic.  Eyes: Pupils are equal, round, and reactive to light. EOM are normal.  Neck: Normal range of motion. Neck supple. No JVD present.  Cardiovascular: Normal rate and regular  rhythm.  Exam reveals no gallop and no friction rub.   No murmur heard. Pulmonary/Chest: No respiratory distress. He has no wheezes.  Abdominal: He exhibits no distension. There is no rebound and no guarding.  Musculoskeletal: Normal range of motion. He exhibits edema, tenderness and deformity.  Edema noted deformity to the left middle finger. Has slight radial deviation. Intact sensation and caprefill.  Neurological: He is alert and oriented to person, place, and time.  Skin: No rash noted. No pallor.  Psychiatric: He has a normal mood and affect. His behavior is normal.  Nursing note and vitals reviewed.    ED Treatments / Results  Labs (all labs ordered are listed, but only abnormal results are displayed) Labs Reviewed - No data to display  EKG  EKG Interpretation None       Radiology Dg Finger Middle Left  Result Date: 06/03/2017 CLINICAL DATA:  Fall dislocating left middle finger. Postreduction attempt per technologist note. EXAM: LEFT MIDDLE FINGER 2+V COMPARISON:  None. FINDINGS: Volar dislocation of the digit at the proximal interphalangeal joint with the middle phalanx displaced volarly and perched on the proximal phalanx head/neck. No evidence of acute fracture. There is soft tissue edema. IMPRESSION: Volar dislocation of the digit at the proximal interphalangeal joint. Electronically Signed   By: Rubye Oaks M.D.   On: 06/03/2017 19:56    Procedures Reduction of dislocation Date/Time: 06/03/2017 8:11 PM Performed by: Adela Lank Deziray Nabi Authorized by: Melene Plan  Consent: Verbal consent obtained. Risks and benefits: risks, benefits and alternatives were discussed Consent given by: patient Patient understanding: patient states understanding of the procedure being performed Imaging studies: imaging studies available Patient identity confirmed: verbally with patient and arm band Time out: Immediately prior to procedure a "time out" was called to verify the correct patient,  procedure, equipment, support staff and site/side marked as required. Local anesthesia used: yes  Anesthesia: Local anesthesia used: yes Local Anesthetic: lidocaine 1% without epinephrine  Sedation: Patient sedated: no Patient tolerance: Patient tolerated the procedure well with no immediate complications  .Nerve Block Date/Time: 06/03/2017 8:12 PM Performed by: Adela Lank Azarel Banner Authorized by: Melene Plan   Consent:    Consent obtained:  Verbal   Consent given by:  Patient   Risks discussed:  Allergic reaction, infection, intravenous injection, bleeding, pain, nerve damage and unsuccessful block   Alternatives discussed:  Alternative treatment Indications:    Indications:  Pain relief and procedural anesthesia Location:    Body area:  Upper extremity   Upper extremity nerve blocked: digital.   Laterality:  Left Pre-procedure details:    Preparation: Patient was prepped and draped in usual sterile fashion   Skin anesthesia (see MAR for exact dosages):    Skin anesthesia method:  None Procedure details (see MAR for exact dosages):    Block needle gauge:  27 G   Anesthetic injected:  Lidocaine 1%  w/o epi   Steroid injected:  None   Additive injected:  None   Injection procedure:  Anatomic landmarks identified and anatomic landmarks palpated   Paresthesia:  None Post-procedure details:    Dressing:  None   Outcome:  Anesthesia achieved   Patient tolerance of procedure:  Tolerated well, no immediate complications   (including critical care time)  Medications Ordered in ED Medications  lidocaine (PF) (XYLOCAINE) 1 % injection (not administered)     Initial Impression / Assessment and Plan / ED Course  I have reviewed the triage vital signs and the nursing notes.  Pertinent labs & imaging results that were available during my care of the patient were reviewed by me and considered in my medical decision making (see chart for details).     31 yo M with the chief complaints of  a left finger injury. Clinically appears dislocated. Significantly improved pain after a digital block. Relocated.  Placed in splint.  I performed the definitive ortho care.  PCP follow up.  8:13 PM:  I have discussed the diagnosis/risks/treatment options with the patient and family and believe the pt to be eligible for discharge home to follow-up with PCP. We also discussed returning to the ED immediately if new or worsening sx occur. We discussed the sx which are most concerning (e.g., sudden worsening pain, fever, inability to tolerate by mouth) that necessitate immediate return. Medications administered to the patient during their visit and any new prescriptions provided to the patient are listed below.  Medications given during this visit Medications  lidocaine (PF) (XYLOCAINE) 1 % injection (not administered)     The patient appears reasonably screen and/or stabilized for discharge and I doubt any other medical condition or other [facility] requiring further screening, evaluation, or treatment in the ED at this time prior to discharge.    Final Clinical Impressions(s) / ED Diagnoses   Final diagnoses:  Dislocation of finger, initial encounter    New Prescriptions New Prescriptions   MORPHINE (MSIR) 15 MG TABLET    Take 1 tablet (15 mg total) by mouth every 4 (four) hours as needed for severe pain.     Melene PlanFloyd, Veola Cafaro, DO 06/03/17 2013    Melene PlanFloyd, Juliza Machnik, DO 06/03/17 2013

## 2017-06-12 ENCOUNTER — Encounter: Payer: Self-pay | Admitting: Physician Assistant

## 2017-06-26 ENCOUNTER — Other Ambulatory Visit: Payer: Self-pay

## 2017-06-26 DIAGNOSIS — M79645 Pain in left finger(s): Secondary | ICD-10-CM | POA: Insufficient documentation

## 2017-06-26 DIAGNOSIS — M20022 Boutonniere deformity of left finger(s): Secondary | ICD-10-CM | POA: Insufficient documentation

## 2017-06-27 DIAGNOSIS — M25642 Stiffness of left hand, not elsewhere classified: Secondary | ICD-10-CM | POA: Insufficient documentation

## 2017-07-10 ENCOUNTER — Other Ambulatory Visit: Payer: Self-pay

## 2017-07-16 ENCOUNTER — Other Ambulatory Visit: Payer: Self-pay

## 2017-08-25 ENCOUNTER — Other Ambulatory Visit: Payer: Self-pay

## 2017-08-25 ENCOUNTER — Other Ambulatory Visit (INDEPENDENT_AMBULATORY_CARE_PROVIDER_SITE_OTHER): Payer: 59

## 2017-08-25 DIAGNOSIS — R638 Other symptoms and signs concerning food and fluid intake: Secondary | ICD-10-CM

## 2017-08-25 DIAGNOSIS — N289 Disorder of kidney and ureter, unspecified: Secondary | ICD-10-CM

## 2017-08-25 DIAGNOSIS — R809 Proteinuria, unspecified: Secondary | ICD-10-CM

## 2017-08-25 MED ORDER — PHENTERMINE HCL 37.5 MG PO TABS: 37.5000 mg | ORAL_TABLET | Freq: Every day | ORAL | 1 refills | Status: DC

## 2017-08-26 LAB — CBC WITH DIFFERENTIAL/PLATELET
BASOS ABS: 38 {cells}/uL (ref 0–200)
BASOS PCT: 0.5 %
EOS ABS: 248 {cells}/uL (ref 15–500)
Eosinophils Relative: 3.3 %
HCT: 43.6 % (ref 38.5–50.0)
HEMOGLOBIN: 14.6 g/dL (ref 13.2–17.1)
Lymphs Abs: 3225 cells/uL (ref 850–3900)
MCH: 28.2 pg (ref 27.0–33.0)
MCHC: 33.5 g/dL (ref 32.0–36.0)
MCV: 84.2 fL (ref 80.0–100.0)
MPV: 10.7 fL (ref 7.5–12.5)
Monocytes Relative: 8.2 %
NEUTROS ABS: 3375 {cells}/uL (ref 1500–7800)
Neutrophils Relative %: 45 %
Platelets: 314 10*3/uL (ref 140–400)
RBC: 5.18 10*6/uL (ref 4.20–5.80)
RDW: 13.7 % (ref 11.0–15.0)
Total Lymphocyte: 43 %
WBC: 7.5 10*3/uL (ref 3.8–10.8)
WBCMIX: 615 {cells}/uL (ref 200–950)

## 2017-08-26 LAB — ANA: ANA: NEGATIVE

## 2017-08-26 LAB — BASIC METABOLIC PANEL WITH GFR
BUN: 12 mg/dL (ref 7–25)
CALCIUM: 9.4 mg/dL (ref 8.6–10.3)
CO2: 30 mmol/L (ref 20–32)
CREATININE: 1.32 mg/dL (ref 0.60–1.35)
Chloride: 106 mmol/L (ref 98–110)
GFR, EST AFRICAN AMERICAN: 84 mL/min/{1.73_m2} (ref >=60)
GFR, EST NON AFRICAN AMERICAN: 72 mL/min/{1.73_m2} (ref >=60)
Glucose, Bld: 88 mg/dL (ref 65–99)
Potassium: 4.3 mmol/L (ref 3.5–5.3)
SODIUM: 142 mmol/L (ref 135–146)

## 2017-08-26 LAB — C3 AND C4
C3 Complement: 131 mg/dL (ref 82–185)
C4 Complement: 29 mg/dL (ref 15–53)

## 2017-08-26 LAB — RPR: RPR: NONREACTIVE

## 2017-08-26 LAB — HEPATITIS C ANTIBODY
Hepatitis C Ab: NONREACTIVE
SIGNAL TO CUT-OFF: 0.02 (ref <1.00)

## 2017-08-26 LAB — ANCA SCREEN W REFLEX TITER: ANCA SCREEN: NEGATIVE

## 2017-08-27 LAB — PROTEIN ELECTROPHORESIS,RANDOM URN
Creatinine, Urine: 221 mg/dL (ref 20–320)
Protein/Creat Ratio: 36 mg/g creat (ref 22–128)
TOTAL PROTEIN, URINE: 8 mg/dL (ref 5–25)

## 2017-08-27 NOTE — Progress Notes (Signed)
Pt aware of lab results & voiced understanding of those results.

## 2017-12-08 NOTE — Progress Notes (Deleted)
Assessment and Plan:   Morbid obesity, unspecified obesity type (HCC) - long discussion about weight loss, diet, and exercise  Prediabetes -     Hemoglobin A1c  Hyperlipidemia -get back on meds medications, check lipids, decrease fatty foods, increase activity.  -     Lipid panel -     atorvastatin (LIPITOR) 20 MG tablet; Take 1 tablet (20 mg total) by mouth daily.  Hypothyroidism, unspecified hypothyroidism type -     TSH -   Has not been on medication, will get on it and we will send in refill -     levothyroxine (SYNTHROID, LEVOTHROID) 75 MCG tablet; Take 1 tablet (75 mcg total) by mouth daily.  Essential hypertension - continue medications, DASH diet, exercise and monitor at home. Call if greater than 130/80.  -     CBC with Differential/Platelet -     BASIC METABOLIC PANEL WITH GFR -     Hepatic function panel  DVT (deep venous thrombosis), left Continue xaretlo, follow up ortho, will need to be on ASA after xarelto, likely provoked DVT  Medication management -     Magnesium  Vitamin D deficiency Start on medication   Continue diet and meds as discussed. Further disposition pending results of labs. Over 30 minutes of exam, counseling, chart review, and critical decision making was performed  Future Appointments  Date Time Provider Department Center  12/10/2017 11:30 AM Quentin Mulling, PA-C [facility] None  05/28/2018 10:00 AM Quentin Mulling, PA-C [facility] None     HPI 32 y.o. male  presents for 3 month follow up on hypertension, cholesterol, prediabetes, and vitamin D deficiency.   Patient injured his leg 06/25, started on a boot for his leg, and diagnosed by ortho with DVT on 04/22/2016, he is on xarelto, will do 3 months of it, has 1 more month to go. Grandmother has blood clot history. Was wearing boot/inactivity at the time of the DVT, he is following with Dr. Berline Chough.   His blood pressure has been controlled at home, today their BP is    He does not  workout. He denies chest pain, shortness of breath, dizziness.  He is not on cholesterol medication and denies myalgias. His cholesterol is not at goal. The cholesterol last visit was:   Lab Results  Component Value Date   CHOL 221 (H) 05/20/2017   HDL 46 05/20/2017   LDLCALC 114 (H) 02/06/2017   TRIG 178 (H) 05/20/2017   CHOLHDL 4.8 05/20/2017    He has been working on diet and exercise for prediabetes, and denies paresthesia of the feet, polydipsia, polyuria and visual disturbances. Last A1C in the office was:  Lab Results  Component Value Date   HGBA1C 5.5 05/20/2017   Patient is on Vitamin D supplement.   Lab Results  Component Value Date   VD25OH 18 (L) 05/20/2017     He is not on thyroid medication, he was unsure if he could take it with his xareto Lab Results  Component Value Date   TSH 7.00 (H) 05/20/2017  .  BMI is There is no height or weight on file to calculate BMI., he is working on diet and exercise. Wt Readings from Last 3 Encounters:  06/03/17 (!) 335 lb (152 kg)  05/20/17 (!) 335 lb (152 kg)  02/20/17 (!) 352 lb 3.2 oz (159.8 kg)    Current Medications:  Current Outpatient Medications on File Prior to Visit  Medication Sig Dispense Refill  . aspirin EC 81 MG tablet  Take 81 mg by mouth 2 (two) times daily.    . furosemide (LASIX) 80 MG tablet Take 1 tablet daily for fluid retention 30 tablet 2  . levothyroxine (SYNTHROID, LEVOTHROID) 100 MCG tablet Take 1 tablet every morning on an empty stomach for 30 minutes 90 tablet 1  . morphine (MSIR) 15 MG tablet Take 1 tablet (15 mg total) by mouth every 4 (four) hours as needed for severe pain. 2 tablet 0  . phentermine (ADIPEX-P) 37.5 MG tablet Take 1 tablet (37.5 mg total) by mouth daily before breakfast. 30 tablet 1   No current facility-administered medications on file prior to visit.    Medical History:  Past Medical History:  Diagnosis Date  . Hyperlipidemia   . Hypothyroid   . Vitamin D deficiency     Allergies: No Known Allergies   Review of Systems:  Review of Systems  Constitutional: Negative.   HENT: Negative.   Eyes: Negative.   Respiratory: Negative.   Cardiovascular: Positive for leg swelling (left leg).  Gastrointestinal: Negative.   Genitourinary: Negative.   Musculoskeletal: Positive for joint pain (left hip pain, worse with exertion). Negative for back pain, falls, myalgias and neck pain.  Skin: Negative.   Neurological: Negative.   Endo/Heme/Allergies: Negative.   Psychiatric/Behavioral: Negative.     Family history- Review and unchanged Social history- Review and unchanged Physical Exam: There were no vitals taken for this visit. Wt Readings from Last 3 Encounters:  06/03/17 (!) 335 lb (152 kg)  05/20/17 (!) 335 lb (152 kg)  02/20/17 (!) 352 lb 3.2 oz (159.8 kg)   General Appearance: Well nourished, in no apparent distress. Eyes: PERRLA, EOMs, conjunctiva no swelling or erythema Sinuses: No Frontal/maxillary tenderness ENT/Mouth: Ext aud canals clear, TMs without erythema, bulging. No erythema, swelling, or exudate on post pharynx.  Tonsils not swollen or erythematous. Hearing normal.  Neck: Supple, thyroid normal.  Respiratory: Respiratory effort normal, BS equal bilaterally without rales, rhonchi, wheezing or stridor.  Cardio: RRR with no MRGs. Brisk peripheral pulses without edema, left leg in compression stocking.  Abdomen: Soft, + BS,  Non tender, no guarding, rebound, hernias, masses. Lymphatics: Non tender without lymphadenopathy.  Musculoskeletal: Full ROM, 5/5 strength, Normal gait Skin: Warm, dry without rashes, lesions, ecchymosis.  Neuro: Cranial nerves intact. Normal muscle tone, no cerebellar symptoms. Psych: Awake and oriented X 3, normal affect, Insight and Judgment appropriate.    Quentin MullingAmanda Verne Lanuza, PA-C 7:19 AM [facility]

## 2017-12-10 ENCOUNTER — Ambulatory Visit: Payer: Self-pay | Admitting: Physician Assistant

## 2017-12-12 NOTE — Progress Notes (Deleted)
Assessment and Plan:   Morbid obesity, unspecified obesity type (HCC) - long discussion about weight loss, diet, and exercise  Prediabetes -     Hemoglobin A1c  Hyperlipidemia -get back on meds medications, check lipids, decrease fatty foods, increase activity.  -     Lipid panel -     atorvastatin (LIPITOR) 20 MG tablet; Take 1 tablet (20 mg total) by mouth daily.  Hypothyroidism, unspecified hypothyroidism type -     TSH -   Has not been on medication, will get on it and we will send in refill -     levothyroxine (SYNTHROID, LEVOTHROID) 75 MCG tablet; Take 1 tablet (75 mcg total) by mouth daily.  Essential hypertension - continue medications, DASH diet, exercise and monitor at home. Call if greater than 130/80.  -     CBC with Differential/Platelet -     BASIC METABOLIC PANEL WITH GFR -     Hepatic function panel  DVT (deep venous thrombosis), left Continue xaretlo, follow up ortho, will need to be on ASA after xarelto, likely provoked DVT  Medication management -     Magnesium  Vitamin D deficiency Start on medication   Continue diet and meds as discussed. Further disposition pending results of labs. Over 30 minutes of exam, counseling, chart review, and critical decision making was performed  Future Appointments  Date Time Provider Department Center  12/15/2017 10:45 AM Quentin Mullingollier, Eshika Reckart, PA-C [facility] None  05/28/2018 10:00 AM Quentin Mullingollier, Addaleigh Nicholls, PA-C [facility] None     HPI 32 y.o. male  presents for 3 month follow up on hypertension, cholesterol, prediabetes, and vitamin D deficiency.   Patient injured his leg 06/25, started on a boot for his leg, and diagnosed by ortho with DVT on 04/22/2016, he is on xarelto, will do 3 months of it, has 1 more month to go. Grandmother has blood clot history. Was wearing boot/inactivity at the time of the DVT, he is following with Dr. Berline Choughigby.   His blood pressure has been controlled at home, today their BP is    He does not  workout. He denies chest pain, shortness of breath, dizziness.  He is not on cholesterol medication and denies myalgias. His cholesterol is not at goal. The cholesterol last visit was:   Lab Results  Component Value Date   CHOL 221 (H) 05/20/2017   HDL 46 05/20/2017   LDLCALC 144 (H) 05/20/2017   TRIG 178 (H) 05/20/2017   CHOLHDL 4.8 05/20/2017    He has been working on diet and exercise for prediabetes, and denies paresthesia of the feet, polydipsia, polyuria and visual disturbances. Last A1C in the office was:  Lab Results  Component Value Date   HGBA1C 5.5 05/20/2017   Patient is on Vitamin D supplement.   Lab Results  Component Value Date   VD25OH 18 (L) 05/20/2017     He is not on thyroid medication, he was unsure if he could take it with his xareto Lab Results  Component Value Date   TSH 7.00 (H) 05/20/2017  .  BMI is There is no height or weight on file to calculate BMI., he is working on diet and exercise. Wt Readings from Last 3 Encounters:  06/03/17 (!) 335 lb (152 kg)  05/20/17 (!) 335 lb (152 kg)  02/20/17 (!) 352 lb 3.2 oz (159.8 kg)    Current Medications:  Current Outpatient Medications on File Prior to Visit  Medication Sig Dispense Refill  . aspirin EC 81 MG tablet  Take 81 mg by mouth 2 (two) times daily.    . furosemide (LASIX) 80 MG tablet Take 1 tablet daily for fluid retention 30 tablet 2  . levothyroxine (SYNTHROID, LEVOTHROID) 100 MCG tablet Take 1 tablet every morning on an empty stomach for 30 minutes 90 tablet 1  . morphine (MSIR) 15 MG tablet Take 1 tablet (15 mg total) by mouth every 4 (four) hours as needed for severe pain. 2 tablet 0  . phentermine (ADIPEX-P) 37.5 MG tablet Take 1 tablet (37.5 mg total) by mouth daily before breakfast. 30 tablet 1   No current facility-administered medications on file prior to visit.    Medical History:  Past Medical History:  Diagnosis Date  . Hyperlipidemia   . Hypothyroid   . Vitamin D deficiency     Allergies: No Known Allergies   Review of Systems:  Review of Systems  Constitutional: Negative.   HENT: Negative.   Eyes: Negative.   Respiratory: Negative.   Cardiovascular: Positive for leg swelling (left leg).  Gastrointestinal: Negative.   Genitourinary: Negative.   Musculoskeletal: Positive for joint pain (left hip pain, worse with exertion). Negative for back pain, falls, myalgias and neck pain.  Skin: Negative.   Neurological: Negative.   Endo/Heme/Allergies: Negative.   Psychiatric/Behavioral: Negative.     Family history- Review and unchanged Social history- Review and unchanged Physical Exam: There were no vitals taken for this visit. Wt Readings from Last 3 Encounters:  06/03/17 (!) 335 lb (152 kg)  05/20/17 (!) 335 lb (152 kg)  02/20/17 (!) 352 lb 3.2 oz (159.8 kg)   General Appearance: Well nourished, in no apparent distress. Eyes: PERRLA, EOMs, conjunctiva no swelling or erythema Sinuses: No Frontal/maxillary tenderness ENT/Mouth: Ext aud canals clear, TMs without erythema, bulging. No erythema, swelling, or exudate on post pharynx.  Tonsils not swollen or erythematous. Hearing normal.  Neck: Supple, thyroid normal.  Respiratory: Respiratory effort normal, BS equal bilaterally without rales, rhonchi, wheezing or stridor.  Cardio: RRR with no MRGs. Brisk peripheral pulses without edema, left leg in compression stocking.  Abdomen: Soft, + BS,  Non tender, no guarding, rebound, hernias, masses. Lymphatics: Non tender without lymphadenopathy.  Musculoskeletal: Full ROM, 5/5 strength, Normal gait Skin: Warm, dry without rashes, lesions, ecchymosis.  Neuro: Cranial nerves intact. Normal muscle tone, no cerebellar symptoms. Psych: Awake and oriented X 3, normal affect, Insight and Judgment appropriate.    Quentin Mulling, PA-C 7:53 AM [facility]

## 2017-12-15 ENCOUNTER — Ambulatory Visit: Payer: Self-pay | Admitting: Physician Assistant

## 2017-12-17 NOTE — Progress Notes (Deleted)
Assessment and Plan:   Morbid obesity, unspecified obesity type (HCC) - long discussion about weight loss, diet, and exercise  Prediabetes -     Hemoglobin A1c  Hyperlipidemia -get back on meds medications, check lipids, decrease fatty foods, increase activity.  -     Lipid panel -     atorvastatin (LIPITOR) 20 MG tablet; Take 1 tablet (20 mg total) by mouth daily.  Hypothyroidism, unspecified hypothyroidism type -     TSH -   Has not been on medication, will get on it and we will send in refill -     levothyroxine (SYNTHROID, LEVOTHROID) 75 MCG tablet; Take 1 tablet (75 mcg total) by mouth daily.  Essential hypertension - continue medications, DASH diet, exercise and monitor at home. Call if greater than 130/80.  -     CBC with Differential/Platelet -     BASIC METABOLIC PANEL WITH GFR -     Hepatic function panel  DVT (deep venous thrombosis), left Continue xaretlo, follow up ortho, will need to be on ASA after xarelto, likely provoked DVT  Medication management -     Magnesium  Vitamin D deficiency Start on medication   Continue diet and meds as discussed. Further disposition pending results of labs. Over 30 minutes of exam, counseling, chart review, and critical decision making was performed  Future Appointments  Date Time Provider Department Center  12/22/2017  8:45 AM Quentin Mullingollier, Danicia Terhaar, PA-C [facility] None  05/28/2018 10:00 AM Quentin Mullingollier, Jule Whitsel, PA-C [facility] None     HPI 32 y.o. male  presents for 3 month follow up on hypertension, cholesterol, prediabetes, and vitamin D deficiency.   Patient injured his leg 06/25, started on a boot for his leg, and diagnosed by ortho with DVT on 04/22/2016, he is on xarelto, will do 3 months of it, has 1 more month to go. Grandmother has blood clot history. Was wearing boot/inactivity at the time of the DVT, he is following with Dr. Berline Choughigby.   His blood pressure has been controlled at home, today their BP is    He does not  workout. He denies chest pain, shortness of breath, dizziness.  He is not on cholesterol medication and denies myalgias. His cholesterol is not at goal. The cholesterol last visit was:   Lab Results  Component Value Date   CHOL 221 (H) 05/20/2017   HDL 46 05/20/2017   LDLCALC 144 (H) 05/20/2017   TRIG 178 (H) 05/20/2017   CHOLHDL 4.8 05/20/2017    He has been working on diet and exercise for prediabetes, and denies paresthesia of the feet, polydipsia, polyuria and visual disturbances. Last A1C in the office was:  Lab Results  Component Value Date   HGBA1C 5.5 05/20/2017   Patient is on Vitamin D supplement.   Lab Results  Component Value Date   VD25OH 18 (L) 05/20/2017     He is not on thyroid medication, he was unsure if he could take it with his xareto Lab Results  Component Value Date   TSH 7.00 (H) 05/20/2017  .  BMI is There is no height or weight on file to calculate BMI., he is working on diet and exercise. Wt Readings from Last 3 Encounters:  06/03/17 (!) 335 lb (152 kg)  05/20/17 (!) 335 lb (152 kg)  02/20/17 (!) 352 lb 3.2 oz (159.8 kg)    Current Medications:  Current Outpatient Medications on File Prior to Visit  Medication Sig Dispense Refill  . aspirin EC 81 MG  tablet Take 81 mg by mouth 2 (two) times daily.    . furosemide (LASIX) 80 MG tablet Take 1 tablet daily for fluid retention 30 tablet 2  . levothyroxine (SYNTHROID, LEVOTHROID) 100 MCG tablet Take 1 tablet every morning on an empty stomach for 30 minutes 90 tablet 1  . morphine (MSIR) 15 MG tablet Take 1 tablet (15 mg total) by mouth every 4 (four) hours as needed for severe pain. 2 tablet 0  . phentermine (ADIPEX-P) 37.5 MG tablet Take 1 tablet (37.5 mg total) by mouth daily before breakfast. 30 tablet 1   No current facility-administered medications on file prior to visit.    Medical History:  Past Medical History:  Diagnosis Date  . Hyperlipidemia   . Hypothyroid   . Vitamin D deficiency     Allergies: No Known Allergies   Review of Systems:  Review of Systems  Constitutional: Negative.   HENT: Negative.   Eyes: Negative.   Respiratory: Negative.   Cardiovascular: Positive for leg swelling (left leg).  Gastrointestinal: Negative.   Genitourinary: Negative.   Musculoskeletal: Positive for joint pain (left hip pain, worse with exertion). Negative for back pain, falls, myalgias and neck pain.  Skin: Negative.   Neurological: Negative.   Endo/Heme/Allergies: Negative.   Psychiatric/Behavioral: Negative.     Family history- Review and unchanged Social history- Review and unchanged Physical Exam: There were no vitals taken for this visit. Wt Readings from Last 3 Encounters:  06/03/17 (!) 335 lb (152 kg)  05/20/17 (!) 335 lb (152 kg)  02/20/17 (!) 352 lb 3.2 oz (159.8 kg)   General Appearance: Well nourished, in no apparent distress. Eyes: PERRLA, EOMs, conjunctiva no swelling or erythema Sinuses: No Frontal/maxillary tenderness ENT/Mouth: Ext aud canals clear, TMs without erythema, bulging. No erythema, swelling, or exudate on post pharynx.  Tonsils not swollen or erythematous. Hearing normal.  Neck: Supple, thyroid normal.  Respiratory: Respiratory effort normal, BS equal bilaterally without rales, rhonchi, wheezing or stridor.  Cardio: RRR with no MRGs. Brisk peripheral pulses without edema, left leg in compression stocking.  Abdomen: Soft, + BS,  Non tender, no guarding, rebound, hernias, masses. Lymphatics: Non tender without lymphadenopathy.  Musculoskeletal: Full ROM, 5/5 strength, Normal gait Skin: Warm, dry without rashes, lesions, ecchymosis.  Neuro: Cranial nerves intact. Normal muscle tone, no cerebellar symptoms. Psych: Awake and oriented X 3, normal affect, Insight and Judgment appropriate.    Quentin Mulling, PA-C 7:53 AM [facility]

## 2017-12-22 ENCOUNTER — Ambulatory Visit: Payer: Self-pay | Admitting: Physician Assistant

## 2018-01-04 NOTE — Progress Notes (Deleted)
FOLLOW UP  Assessment and Plan:   Hypertension Well controlled with current medications  Monitor blood pressure at home; patient to call if consistently greater than 130/80 Continue DASH diet.   Reminder to go to the ER if any CP, SOB, nausea, dizziness, severe HA, changes vision/speech, left arm numbness and tingling and jaw pain.  Cholesterol Currently above goal but improving with lifestyle modification -  Continue low cholesterol diet and exercise.  Check lipid panel.   Other abnormal glucose Previously prediabetic; recent A1Cs improved -  Continue diet and exercise.  Perform daily foot/skin check, notify office of any concerning changes.  Check A1C  Morbid Obesity with co morbidities Long discussion about weight loss, diet, and exercise Recommended diet heavy in fruits and veggies and low in animal meats, cheeses, and dairy products, appropriate calorie intake Discussed ideal weight for height (below ***) and initial weight goal (***) Patient will work on *** Will follow up in 3 months  Hypothyroidism continue medications the same pending lab results reminded to take on an empty stomach 30-5360mins before food.  check TSH level  Vitamin D Def Below goal at last visit; continue to recommend supplementation for goal of 70-100 Check Vit D level  Continue diet and meds as discussed. Further disposition pending results of labs. Discussed med's effects and SE's.   Over 30 minutes of exam, counseling, chart review, and critical decision making was performed.   Future Appointments  Date Time Provider Department Center  01/05/2018  9:30 AM Judd Gaudierorbett, Haly Feher, NP [facility] None  05/28/2018 10:00 AM Quentin Mullingollier, Amanda, PA-C [facility] None    ----------------------------------------------------------------------------------------------------------------------  HPI 32 y.o. male  presents for 6 month follow up on hypertension, cholesterol, glucose management, hypothyroid, morbid  obesity and vitamin D deficiency.   he is prescribed phentermine for weight loss.  While on the medication they have lost {NUMBERS 0-12:18577} lbs since last visit. They deny palpitations, anxiety, trouble sleeping, elevated BP.   BMI is There is no height or weight on file to calculate BMI., he is working on diet and exercise. Wt Readings from Last 3 Encounters:  06/03/17 (!) 335 lb (152 kg)  05/20/17 (!) 335 lb (152 kg)  02/20/17 (!) 352 lb 3.2 oz (159.8 kg)   Typical breakfast: Typical lunch:  Typical dinner: Exercise:  Water intake:    His blood pressure {HAS HAS NOT:18834} been controlled at home, today their BP is    He {DOES_DOES HYQ:65784}OT:18564} workout. He denies chest pain, shortness of breath, dizziness.   He is not on cholesterol medication at this time. His cholesterol is not at goal. The cholesterol last visit was:   Lab Results  Component Value Date   CHOL 221 (H) 05/20/2017   HDL 46 05/20/2017   LDLCALC 144 (H) 05/20/2017   TRIG 178 (H) 05/20/2017   CHOLHDL 4.8 05/20/2017    He {Has/has not:18111} been working on diet and exercise for glucose management, and denies {Symptoms; diabetes w/o none:19199}. Last A1C in the office was:  Lab Results  Component Value Date   HGBA1C 5.5 05/20/2017   He is on thyroid medication. His medication was changed last visit (restarted):    Lab Results  Component Value Date   TSH 7.00 (H) 05/20/2017   Patient is not currently on Vitamin D supplement despite recommendation and at last check was very low:    Lab Results  Component Value Date   VD25OH 18 (L) 05/20/2017        Current Medications:  Current Outpatient  Medications on File Prior to Visit  Medication Sig  . aspirin EC 81 MG tablet Take 81 mg by mouth 2 (two) times daily.  . furosemide (LASIX) 80 MG tablet Take 1 tablet daily for fluid retention  . levothyroxine (SYNTHROID, LEVOTHROID) 100 MCG tablet Take 1 tablet every morning on an empty stomach for 30 minutes  .  morphine (MSIR) 15 MG tablet Take 1 tablet (15 mg total) by mouth every 4 (four) hours as needed for severe pain.  . phentermine (ADIPEX-P) 37.5 MG tablet Take 1 tablet (37.5 mg total) by mouth daily before breakfast.   No current facility-administered medications on file prior to visit.      Allergies: No Known Allergies   Medical History:  Past Medical History:  Diagnosis Date  . Hyperlipidemia   . Hypothyroid   . Vitamin D deficiency    Family history- Reviewed and unchanged Social history- Reviewed and unchanged   Review of Systems:  Review of Systems  Constitutional: Negative for malaise/fatigue and weight loss.  HENT: Negative for hearing loss and tinnitus.   Eyes: Negative for blurred vision and double vision.  Respiratory: Negative for cough, shortness of breath and wheezing.   Cardiovascular: Negative for chest pain, palpitations, orthopnea, claudication and leg swelling.  Gastrointestinal: Negative for abdominal pain, blood in stool, constipation, diarrhea, heartburn, melena, nausea and vomiting.  Genitourinary: Negative.   Musculoskeletal: Negative for joint pain and myalgias.  Skin: Negative for rash.  Neurological: Negative for dizziness, tingling, sensory change, weakness and headaches.  Endo/Heme/Allergies: Negative for polydipsia.  Psychiatric/Behavioral: Negative.   All other systems reviewed and are negative.     Physical Exam: There were no vitals taken for this visit. Wt Readings from Last 3 Encounters:  06/03/17 (!) 335 lb (152 kg)  05/20/17 (!) 335 lb (152 kg)  02/20/17 (!) 352 lb 3.2 oz (159.8 kg)   General Appearance: Well nourished, in no apparent distress. Eyes: PERRLA, EOMs, conjunctiva no swelling or erythema Sinuses: No Frontal/maxillary tenderness ENT/Mouth: Ext aud canals clear, TMs without erythema, bulging. No erythema, swelling, or exudate on post pharynx.  Tonsils not swollen or erythematous. Hearing normal.  Neck: Supple, thyroid  normal.  Respiratory: Respiratory effort normal, BS equal bilaterally without rales, rhonchi, wheezing or stridor.  Cardio: RRR with no MRGs. Brisk peripheral pulses without edema.  Abdomen: Soft, + BS.  Non tender, no guarding, rebound, hernias, masses. Lymphatics: Non tender without lymphadenopathy.  Musculoskeletal: Full ROM, 5/5 strength, {PSY - GAIT AND STATION:22860} gait Skin: Warm, dry without rashes, lesions, ecchymosis.  Neuro: Cranial nerves intact. No cerebellar symptoms.  Psych: Awake and oriented X 3, normal affect, Insight and Judgment appropriate.    Dan Maker, NP 1:21 PM [facility]

## 2018-01-05 ENCOUNTER — Ambulatory Visit: Payer: Self-pay | Admitting: Adult Health

## 2018-03-18 ENCOUNTER — Encounter: Payer: Self-pay | Admitting: Physician Assistant

## 2018-05-28 ENCOUNTER — Encounter: Payer: Self-pay | Admitting: Physician Assistant

## 2021-03-09 ENCOUNTER — Emergency Department (HOSPITAL_COMMUNITY)
Admission: EM | Admit: 2021-03-09 | Discharge: 2021-03-09 | Disposition: A | Discharge status: Home / Self Care | Payer: Managed Care, Other (non HMO) | Attending: Emergency Medicine | Admitting: Emergency Medicine

## 2021-03-09 ENCOUNTER — Emergency Department (HOSPITAL_COMMUNITY): Payer: Managed Care, Other (non HMO)

## 2021-03-09 ENCOUNTER — Other Ambulatory Visit: Payer: Self-pay

## 2021-03-09 DIAGNOSIS — Z7982 Long term (current) use of aspirin: Secondary | ICD-10-CM | POA: Insufficient documentation

## 2021-03-09 DIAGNOSIS — H4710 Unspecified papilledema: Secondary | ICD-10-CM | POA: Diagnosis not present

## 2021-03-09 DIAGNOSIS — E039 Hypothyroidism, unspecified: Secondary | ICD-10-CM | POA: Insufficient documentation

## 2021-03-09 DIAGNOSIS — I10 Essential (primary) hypertension: Secondary | ICD-10-CM | POA: Diagnosis not present

## 2021-03-09 DIAGNOSIS — Z79899 Other long term (current) drug therapy: Secondary | ICD-10-CM | POA: Diagnosis not present

## 2021-03-09 LAB — I-STAT CHEM 8, ED
BUN: 13 mg/dL (ref 6–20)
Calcium, Ion: 1.15 mmol/L (ref 1.15–1.40)
Chloride: 107 mmol/L (ref 98–111)
Creatinine, Ser: 1.2 mg/dL (ref 0.61–1.24)
Glucose, Bld: 87 mg/dL (ref 70–99)
HCT: 46 % (ref 39.0–52.0)
Hemoglobin: 15.6 g/dL (ref 13.0–17.0)
Potassium: 3.9 mmol/L (ref 3.5–5.1)
Sodium: 142 mmol/L (ref 135–145)
TCO2: 25 mmol/L (ref 22–32)

## 2021-03-09 MED ORDER — GADOBUTROL 1 MMOL/ML IV SOLN
10.0000 mL | Freq: Once | INTRAVENOUS | Status: AC | PRN
  Administered 2021-03-09: 14:00:00 10 mL via INTRAVENOUS

## 2021-03-09 NOTE — ED Triage Notes (Signed)
Pt sent here by eye doctor for MRI of brain and orbits. Pt denies complaints.

## 2021-03-09 NOTE — ED Provider Notes (Signed)
[facility] EMERGENCY DEPARTMENT Provider Note   CSN: [id number] Arrival date & time: 03/09/21  1156     History No chief complaint on file.   David Tanner is a 35 y.o. male.  Patient is a 35 year old male with no known medical problems who is presenting today after being seen by [facility] due to bilateral papilledema.  Patient denies having headaches, vision changes.  He does wear contacts but they have been the same strength for quite some time.  He does not take any medications or over-the-counter medicines.  When he got to the ophthalmologist office today he had been referred there due to papilledema.  It was present today but there were no other acute findings on his exam.  He was sent to the emergency room to have an MRI with and without contrast of the brain and orbits.       Past Medical History:  Diagnosis Date   Hyperlipidemia    Hypothyroid    Vitamin D deficiency     Patient Active Problem List   Diagnosis Date Noted   History of DVT (deep vein thrombosis) 05/20/2017   Obesity, morbid, BMI 50 or higher (HCC) 02/08/2017   Other abnormal glucose 03/13/2015   Essential hypertension 03/13/2015   Medication management 03/13/2015   Noncompliance 03/13/2015   Hypothyroid    Vitamin D deficiency    Hyperlipidemia     No past surgical history on file.     Family History  Problem Relation Age of Onset   Hypertension Mother    Cancer Maternal Grandfather        leukemia    Social History   Tobacco Use   Smoking status: Every Day    Packs/day: 0.25    Years: 1.00    Pack years: 0.25    Types: Cigarettes   Smokeless tobacco: Never  Substance Use Topics   Alcohol use: No    Alcohol/week: 0.0 standard drinks   Drug use: No    Home Medications Prior to Admission medications   Medication Sig Start Date End Date Taking? Authorizing Provider  aspirin EC 81 MG tablet Take 81 mg by mouth 2 (two) times daily.    [provider]  furosemide (LASIX) 80 MG tablet Take 1 tablet daily for fluid retention 02/20/17 05/23/17  [name], MD  levothyroxine (SYNTHROID, LEVOTHROID) 100 MCG tablet Take 1 tablet every morning on an empty stomach for 30 minutes 05/20/17 11/20/17  Doree Albee, PA-C  morphine (MSIR) 15 MG tablet Take 1 tablet (15 mg total) by mouth every 4 (four) hours as needed for severe pain. 06/03/17   Melene Plan, DO  phentermine (ADIPEX-P) 37.5 MG tablet Take 1 tablet (37.5 mg total) by mouth daily before breakfast. 08/25/17   Judd Gaudier, NP    Allergies    Patient has no known allergies.  Review of Systems   Review of Systems  All other systems reviewed and are negative.  Physical Exam Updated Vital Signs BP 132/90 (BP Location: Left Arm)   Pulse 61   Temp 99.3 F (37.4 C) (Oral)   Resp 18   SpO2 100%   Physical Exam Vitals and nursing note reviewed.  Constitutional:      General: He is not in acute distress.    Appearance: Normal appearance.  HENT:     Head: Normocephalic and atraumatic.     Nose: Nose normal.  Eyes:     Extraocular Movements: Extraocular movements intact.  Conjunctiva/sclera: Conjunctivae normal.     Pupils: Pupils are equal, round, and reactive to light.  Cardiovascular:     Rate and Rhythm: Normal rate and regular rhythm.     Pulses: Normal pulses.  Pulmonary:     Effort: Pulmonary effort is normal. No respiratory distress.     Breath sounds: Normal breath sounds. No wheezing.  Musculoskeletal:     Cervical back: Normal range of motion and neck supple.  Skin:    General: Skin is warm and dry.  Neurological:     Mental Status: He is alert and oriented to person, place, and time. Mental status is at baseline.  Psychiatric:        Mood and Affect: Mood normal.        Behavior: Behavior normal.    ED Results / Procedures / Treatments   Labs (all labs ordered are listed, but only abnormal results are displayed) Labs Reviewed  I-STAT  CHEM 8, ED    EKG None  Radiology MR Brain W and Wo Contrast  Result Date: 03/09/2021 CLINICAL DATA:  Papilledema noted at eye exam. EXAM: MRI HEAD AND ORBITS WITHOUT AND WITH CONTRAST TECHNIQUE: Multiplanar, multiecho pulse sequences of the brain and surrounding structures were obtained without and with intravenous contrast. Multiplanar, multiecho pulse sequences of the orbits and surrounding structures were obtained including fat saturation techniques, before and after intravenous contrast administration. CONTRAST:  38mL GADAVIST GADOBUTROL 1 MMOL/ML IV SOLN COMPARISON:  None. FINDINGS: MRI HEAD FINDINGS Brain: The brain has normal appearance without evidence of malformation, atrophy, old or acute infarction, mass lesion, hemorrhage, hydrocephalus or extra-axial collection. No arachnoid herniation into the sella. Vascular: Major vessels at the base of the brain show flow. Skull and upper cervical spine: Negative Other: None MRI ORBITS FINDINGS Orbits: Both globes appear normal. Both optic nerves and nerve sheaths appear normal. Orbital fat is normal. Extra-ocular muscles are normal. Lacrimal glands are normal. Visualized sinuses: Clear Soft tissues: No regional soft tissue abnormality. IMPRESSION: Normal MRI of the brain. No finding to suggest intracranial hypertension Normal MRI the orbits.  Normal optic nerves and nerve sheaths. Electronically Signed   By: Paulina Fusi M.D.   On: 03/09/2021 14:08   MR ORBITS W WO CONTRAST  Result Date: 03/09/2021 CLINICAL DATA:  Papilledema noted at eye exam. EXAM: MRI HEAD AND ORBITS WITHOUT AND WITH CONTRAST TECHNIQUE: Multiplanar, multiecho pulse sequences of the brain and surrounding structures were obtained without and with intravenous contrast. Multiplanar, multiecho pulse sequences of the orbits and surrounding structures were obtained including fat saturation techniques, before and after intravenous contrast administration. CONTRAST:  54mL GADAVIST  GADOBUTROL 1 MMOL/ML IV SOLN COMPARISON:  None. FINDINGS: MRI HEAD FINDINGS Brain: The brain has normal appearance without evidence of malformation, atrophy, old or acute infarction, mass lesion, hemorrhage, hydrocephalus or extra-axial collection. No arachnoid herniation into the sella. Vascular: Major vessels at the base of the brain show flow. Skull and upper cervical spine: Negative Other: None MRI ORBITS FINDINGS Orbits: Both globes appear normal. Both optic nerves and nerve sheaths appear normal. Orbital fat is normal. Extra-ocular muscles are normal. Lacrimal glands are normal. Visualized sinuses: Clear Soft tissues: No regional soft tissue abnormality. IMPRESSION: Normal MRI of the brain. No finding to suggest intracranial hypertension Normal MRI the orbits.  Normal optic nerves and nerve sheaths. Electronically Signed   By: Paulina Fusi M.D.   On: 03/09/2021 14:08    Procedures Procedures   Medications Ordered in ED Medications  gadobutrol (GADAVIST)  1 MMOL/ML injection 10 mL (10 mLs Intravenous Contrast Given 03/09/21 1402)    ED Course  I have reviewed the triage vital signs and the nursing notes.  Pertinent labs & imaging results that were available during my care of the patient were reviewed by me and considered in my medical decision making (see chart for details).    MDM Rules/Calculators/A&P                          Patient is a 35 year old male presenting today due to bilateral papilledema.  Patient currently has no complaints.  He denies any change in his vision.  He has had no headaches or neurologic complaints.  This was found by ophthalmologist Dr. Genia Del on exam today.  He had normal intraocular pressures today, 20/20 vision with his contacts and no other acute findings other than the disc edema.  Patient needed MRI with and without contrast of the brain and orbits to rule out cancer, tumor, mass, stroke or  idiopathic intracranial hypertension. Istat without acute findings.   Patient's MRI of the brain and orbits were negative.  There was normal optic nerves and nerve sheaths and no findings to suggest IIH.  Patient is mildly hypertensive here at 132/90 but otherwise is well-appearing.  Attempted to contact Dr. Genia Del and left a message.  However there is no indication that patient needs to remain in the emergency room any longer.  Will discharge home and have him follow-up with ophthalmology/neurology.  MDM   Amount and/or Complexity of Data Reviewed Clinical lab tests: ordered and reviewed Tests in the radiology section of CPT: ordered and reviewed Independent visualization of images, tracings, or specimens: yes        Final Clinical Impression(s) / ED Diagnoses Final diagnoses:  Papilledema of both eyes    Rx / DC Orders ED Discharge Orders          Ordered    Ambulatory referral to Neurology       Comments: An appointment is requested in approximately: 2 weeks Pt with asymptomatic bilateral papilledema and normal MRI of brain and orbit.   03/09/21 1521             Gwyneth Sprout, MD 03/09/21 1523

## 2021-03-09 NOTE — Discharge Instructions (Addendum)
The MRI today was normal.  You should call Dr. Genia Del and we referred you to neurology for the future.  If you start having headaches or change in your vision you should call the above doctors or return to the ER.

## 2021-03-09 NOTE — ED Provider Notes (Signed)
Emergency Medicine Provider Triage Evaluation Note  David Tanner , a 35 y.o. male  was evaluated in triage.  Pt complains of papilledema. Pt was seen by an eye doctor for regular vision check and was found to have swelling of the optic nerve. He was referred to [facility] who noted bilat papilledema and was sent here for further eval. Pt brought paperwork indicating the ophthalmology recommends MRI brain/orbits w/ and w/o contrast. Pt is asymptomatic.  Review of Systems  Positive: papilledema Negative: Vision loss, eye pain  Physical Exam  BP 132/90 (BP Location: Left Arm)   Pulse 61   Temp 99.3 F (37.4 C) (Oral)   Resp 18   SpO2 100%  Gen:   Awake, no distress   Resp:  Normal effort  MSK:   Moves extremities without difficulty  Other:    Medical Decision Making  Medically screening exam initiated at 12:32 PM.  Appropriate orders placed.  David Tanner was informed that the remainder of the evaluation will be completed by another provider, this initial triage assessment does not replace that evaluation, and the importance of remaining in the ED until their evaluation is complete.     David Tanner 03/09/21 1237    David Mulders, MD 03/10/21 [phone]

## 2021-06-14 ENCOUNTER — Encounter: Payer: Self-pay | Admitting: Neurology

## 2021-06-14 ENCOUNTER — Ambulatory Visit (INDEPENDENT_AMBULATORY_CARE_PROVIDER_SITE_OTHER): Payer: Managed Care, Other (non HMO) | Admitting: Neurology

## 2021-06-14 VITALS — BP 150/84 | HR 69 | Ht 72.0 in | Wt 366.0 lb

## 2021-06-14 DIAGNOSIS — E039 Hypothyroidism, unspecified: Secondary | ICD-10-CM

## 2021-06-14 DIAGNOSIS — H4710 Unspecified papilledema: Secondary | ICD-10-CM | POA: Diagnosis not present

## 2021-06-14 MED ORDER — TOPIRAMATE 100 MG PO TABS: 100.0000 mg | ORAL_TABLET | Freq: Two times a day (BID) | ORAL | 11 refills | Status: DC

## 2021-06-14 NOTE — Progress Notes (Signed)
Chief Complaint  Patient presents with   Eye Problem    New patient: internal referral: was told the nerves in his eyes are swollen Room 16, alone in room      ASSESSMENT AND PLAN  David Tanner is a 35 y.o. male   Bilateral papillary edema at his ophthalmology evaluation on March 09, 2021 by Dr. Genia Del  Normal MRI of the brain and orbit with without contrast  Consistent with idiopathic intracranial hypertension, related to his obesity,  Fluoroscopy guided lumbar puncture  Laboratory evaluation to rule out treatable etiology  Starting Topamax 100 mg twice a day  Return to clinic with 3 months   DIAGNOSTIC DATA (LABS, IMAGING, TESTING) - I reviewed patient records, labs, notes, testing and imaging myself where available.   MEDICAL HISTORY:  David Tanner is a 35 year old male, seen in request by his primary care physician Dr. Anitra Lauth, Alphonzo Lemmings, for evaluation of bilateral papillary edema, initial evaluation was June 14, 2021,   I reviewed and summarized the referring note.PMHX. Obesity HLD Hypothyroidism Vitamin D deficiency History of left lower extremity DVT following left ankle fracture, wearing boots  He had a history of obesity, continued weight gain of 40 pounds since past 4 years, also had a history of hypothyroidism, was on thyroid supplement, but lost insurance few years back, has been ordered for his medications  In April 2022, during his yearly eye exam at [facility], he was noted to have bilateral papillary edema, was referred to ophthalmologist, seen by Dr. Genia Del on March 09, 2021, was noted to have bilateral disc edema, there is no macular, normal vessel, he was referred to emergency room,  Personally reviewed normal MRI brain, orbit w/wo  Laboratory evaluations in 2018 showed negative ANA, complement, RPR, hepatitis C, ANCA, A1c was 5.5, TSH was elevated 7.0, lipid panel LDL 144  He denies frequent headaches, denies visual changes,  PHYSICAL  EXAM:   Vitals:   06/14/21 0923  BP: (!) 150/84  Pulse: 69  Weight: (!) 366 lb (166 kg)  Height: 6' (1.829 m)   Not recorded     Body mass index is 49.64 kg/m.  PHYSICAL EXAMNIATION:  Gen: NAD, conversant, well nourised, well groomed                     Cardiovascular: Regular rate rhythm, no peripheral edema, warm, nontender. Eyes: Conjunctivae clear without exudates or hemorrhage Neck: Supple, no carotid bruits. Pulmonary: Clear to auscultation bilaterally   NEUROLOGICAL EXAM:  MENTAL STATUS: Speech:    Speech is normal; fluent and spontaneous with normal comprehension.  Cognition:     Orientation to time, place and person     Normal recent and remote memory     Normal Attention span and concentration     Normal Language, naming, repeating,spontaneous speech     Fund of knowledge   CRANIAL NERVES: CN II: Visual fields are full to confrontation. Pupils are round equal and briskly reactive to light. CN III, IV, VI: extraocular movement are normal. No ptosis. CN V: Facial sensation is intact to light touch CN VII: Face is symmetric with normal eye closure  CN VIII: Hearing is normal to causal conversation. CN IX, X: Phonation is normal. CN XI: Head turning and shoulder shrug are intact  MOTOR: There is no pronator drift of out-stretched arms. Muscle bulk and tone are normal. Muscle strength is normal.  REFLEXES: Reflexes are 2+ and symmetric at the biceps, triceps, knees, and ankles. Plantar  responses are flexor.  SENSORY: Intact to light touch, pinprick and vibratory sensation are intact in fingers and toes.  COORDINATION: There is no trunk or limb dysmetria noted.  GAIT/STANCE: Posture is normal. Gait is steady with normal steps, base, arm swing, and turning. Heel and toe walking are normal. Tandem gait is normal.  Romberg is absent.  REVIEW OF SYSTEMS:  Full 14 system review of systems performed and notable only for as above All other review of systems  were negative.   ALLERGIES: No Known Allergies  HOME MEDICATIONS: Current Outpatient Medications  Medication Sig Dispense Refill   aspirin EC 81 MG tablet Take 81 mg by mouth 2 (two) times daily.     No current facility-administered medications for this visit.    PAST MEDICAL HISTORY: Past Medical History:  Diagnosis Date   DVT (deep venous thrombosis) (HCC)    Left leg   Hyperlipidemia    Hypothyroid    Vitamin D deficiency     PAST SURGICAL HISTORY: History reviewed. No pertinent surgical history.  FAMILY HISTORY: Family History  Problem Relation Age of Onset   Hypertension Mother    Cancer Maternal Grandfather        leukemia    SOCIAL HISTORY: Social History   Socioeconomic History   Marital status: Single    Spouse name: Barista   Number of children: Not on file   Years of education: Not on file   Highest education level: Not on file  Occupational History   Not on file  Tobacco Use   Smoking status: Every Day    Packs/day: 0.25    Years: 1.00    Pack years: 0.25    Types: Cigarettes   Smokeless tobacco: Never  Substance and Sexual Activity   Alcohol use: No    Alcohol/week: 0.0 standard drinks   Drug use: No   Sexual activity: Yes    Comment: With girlfriend for 4 years  Other Topics Concern   Not on file  Social History Narrative   Lives with wife and 3 kids   Right Handed   Drinks 6-7 cups caffeine   Social Determinants of Health   Financial Resource Strain: Not on file  Food Insecurity: Not on file  Transportation Needs: Not on file  Physical Activity: Not on file  Stress: Not on file  Social Connections: Not on file  Intimate Partner Violence: Not on file      Levert Feinstein, M.D. Ph.D.  [facility] [address] [place], [place] [address] Ph: [phone] Fax: [phone]  CC:  Gwyneth Sprout, MD [address] [place],  [place] [address]  Pcp, No

## 2021-06-15 LAB — CBC WITH DIFFERENTIAL
Basophils Absolute: 0.1 10*3/uL (ref 0.0–0.2)
Basos: 1 %
EOS (ABSOLUTE): 0.2 10*3/uL (ref 0.0–0.4)
Eos: 2 %
Hematocrit: 46.4 % (ref 37.5–51.0)
Hemoglobin: 15.4 g/dL (ref 13.0–17.7)
Immature Grans (Abs): 0 10*3/uL (ref 0.0–0.1)
Immature Granulocytes: 0 %
Lymphocytes Absolute: 3.2 10*3/uL — ABNORMAL HIGH (ref 0.7–3.1)
Lymphs: 44 %
MCH: 28.6 pg (ref 26.6–33.0)
MCHC: 33.2 g/dL (ref 31.5–35.7)
MCV: 86 fL (ref 79–97)
Monocytes Absolute: 0.6 10*3/uL (ref 0.1–0.9)
Monocytes: 8 %
Neutrophils Absolute: 3.3 10*3/uL (ref 1.4–7.0)
Neutrophils: 45 %
RBC: 5.39 x10E6/uL (ref 4.14–5.80)
RDW: 14.2 % (ref 11.6–15.4)
WBC: 7.3 10*3/uL (ref 3.4–10.8)

## 2021-06-15 LAB — COMPREHENSIVE METABOLIC PANEL
ALT: 20 IU/L (ref 0–44)
AST: 21 IU/L (ref 0–40)
Albumin/Globulin Ratio: 2.2 (ref 1.2–2.2)
Albumin: 4.7 g/dL (ref 4.0–5.0)
Alkaline Phosphatase: 50 IU/L (ref 44–121)
BUN/Creatinine Ratio: 8 — ABNORMAL LOW (ref 9–20)
BUN: 11 mg/dL (ref 6–20)
Bilirubin Total: 0.3 mg/dL (ref 0.0–1.2)
CO2: 22 mmol/L (ref 20–29)
Calcium: 9.8 mg/dL (ref 8.7–10.2)
Chloride: 102 mmol/L (ref 96–106)
Creatinine, Ser: 1.45 mg/dL — ABNORMAL HIGH (ref 0.76–1.27)
Globulin, Total: 2.1 g/dL (ref 1.5–4.5)
Glucose: 92 mg/dL (ref 65–99)
Potassium: 4.7 mmol/L (ref 3.5–5.2)
Sodium: 142 mmol/L (ref 134–144)
Total Protein: 6.8 g/dL (ref 6.0–8.5)
eGFR: 65 mL/min/{1.73_m2} (ref >59)

## 2021-06-15 LAB — THYROID PANEL WITH TSH
Free Thyroxine Index: 1.6 (ref 1.2–4.9)
T3 Uptake Ratio: 25 % (ref 24–39)
T4, Total: 6.3 ug/dL (ref 4.5–12.0)
TSH: 4.85 u[IU]/mL — ABNORMAL HIGH (ref 0.450–4.500)

## 2021-06-15 LAB — HIV ANTIBODY (ROUTINE TESTING W REFLEX): HIV Screen 4th Generation wRfx: NONREACTIVE

## 2021-06-15 LAB — ANA W/REFLEX IF POSITIVE: Anti Nuclear Antibody (ANA): NEGATIVE

## 2021-06-15 LAB — RPR: RPR Ser Ql: NONREACTIVE

## 2021-06-15 LAB — SEDIMENTATION RATE: Sed Rate: 3 mm/hr (ref 0–15)

## 2021-06-15 LAB — C-REACTIVE PROTEIN: CRP: 1 mg/L (ref 0–10)

## 2021-06-15 LAB — VITAMIN B12: Vitamin B-12: 150 pg/mL — ABNORMAL LOW (ref 232–1245)

## 2021-06-15 LAB — HGB A1C W/O EAG: Hgb A1c MFr Bld: 5.9 % — ABNORMAL HIGH (ref 4.8–5.6)

## 2021-06-15 NOTE — Telephone Encounter (Signed)
Please call patient,   B12 deficiency 150,  he needs Vit B12 im supplement, daily xone week, then weekly for one month, then monthly.  A1C 5.9, mildly elevated glucose, you should start by exercise, diet control  Mild elevated TSH 4.85, but normal total T4, T3 uptake ratio,  Elevated creatinine 1.45, indicating mild abnormal kidney function, he would benefit increase water intake,

## 2021-06-18 ENCOUNTER — Telehealth: Payer: Self-pay | Admitting: *Deleted

## 2021-06-18 NOTE — Telephone Encounter (Signed)
I spoke to the patient and he verbalized understanding of the lab findings. He has an appt on 07/30/21 to become established with a primary care physician. He is in agreement to monitor his diet, increase his water intake and add exercise. He does not care for needles and would prefer to take oral B12. He will start taking daily and discuss further management at his PCP appt.

## 2021-06-18 NOTE — Telephone Encounter (Signed)
Message from Dr. Terrace Arabia:  Please call patient,    B12 deficiency 150,  he needs Vit B12 im supplement, daily xone week, then weekly for one month, then monthly.   A1C 5.9, mildly elevated glucose, you should start by exercise, diet control   Mild elevated TSH 4.85, but normal total T4, T3 uptake ratio,   Elevated creatinine 1.45, indicating mild abnormal kidney function, he would benefit increase water intake,

## 2021-06-21 ENCOUNTER — Other Ambulatory Visit: Payer: Managed Care, Other (non HMO)

## 2021-06-25 ENCOUNTER — Other Ambulatory Visit: Payer: Self-pay

## 2021-06-25 ENCOUNTER — Ambulatory Visit
Admission: RE | Admit: 2021-06-25 | Discharge: 2021-06-25 | Disposition: A | Discharge status: Home / Self Care | Payer: Managed Care, Other (non HMO) | Source: Ambulatory Visit | Attending: Neurology | Admitting: Neurology

## 2021-06-25 DIAGNOSIS — E039 Hypothyroidism, unspecified: Secondary | ICD-10-CM

## 2021-06-25 DIAGNOSIS — H4710 Unspecified papilledema: Secondary | ICD-10-CM

## 2021-06-25 NOTE — Discharge Instructions (Signed)

## 2021-07-27 LAB — CSF CELL COUNT WITH DIFFERENTIAL
RBC Count, CSF: 3 cells/uL — ABNORMAL HIGH
WBC, CSF: 3 cells/uL (ref 0–5)

## 2021-07-27 LAB — GLUCOSE, CSF: Glucose, CSF: 60 mg/dL (ref 40–80)

## 2021-07-27 LAB — FUNGUS CULTURE W SMEAR
CULTURE:: NO GROWTH
MICRO NUMBER:: 12422086
SMEAR:: NONE SEEN
SPECIMEN QUALITY:: ADEQUATE

## 2021-07-27 LAB — GRAM STAIN
MICRO NUMBER:: 12422085
SPECIMEN QUALITY:: ADEQUATE

## 2021-07-27 LAB — VDRL, CSF: VDRL Quant, CSF: NONREACTIVE

## 2021-07-27 LAB — PROTEIN, CSF: Total Protein, CSF: 41 mg/dL (ref 15–45)

## 2021-07-30 ENCOUNTER — Other Ambulatory Visit: Payer: Self-pay

## 2021-07-30 ENCOUNTER — Ambulatory Visit (INDEPENDENT_AMBULATORY_CARE_PROVIDER_SITE_OTHER): Payer: Managed Care, Other (non HMO) | Admitting: Family Medicine

## 2021-07-30 ENCOUNTER — Encounter (HOSPITAL_BASED_OUTPATIENT_CLINIC_OR_DEPARTMENT_OTHER): Payer: Self-pay | Admitting: Family Medicine

## 2021-07-30 ENCOUNTER — Encounter (HOSPITAL_BASED_OUTPATIENT_CLINIC_OR_DEPARTMENT_OTHER): Payer: Self-pay

## 2021-07-30 DIAGNOSIS — R7303 Prediabetes: Secondary | ICD-10-CM | POA: Insufficient documentation

## 2021-07-30 NOTE — Patient Instructions (Signed)
  Medication Instructions:  Your physician recommends that you continue on your current medications as directed. Please refer to the Current Medication list given to you today. --If you need a refill on any your medications before your next appointment, please call your pharmacy first. If no refills are authorized on file call the office.--  Referrals/Procedures/Imaging: A referral has been placed for you to Medical Nutrition Therapy for evaluation and treatment. Someone from the scheduling department will be in contact with you in regards to coordinating your consultation. If you do not hear from any of the schedulers within 7-10 business days please give their office a call.  Follow-Up: Your next appointment:   Your physician recommends that you schedule a follow-up appointment in: 2 MONTHS with Dr. de [place]  You will receive a text message or e-mail with a link to a survey about your care and experience with [place] today! We would greatly appreciate your feedback!   Thanks for letting us be apart of your health journey!!  Primary Care and Sports Medicine   Dr. Ceasar Mons Peru   We encourage you to activate your patient portal called "MyChart".  Sign up information is provided on this After Visit Summary.  MyChart is used to connect with patients for Virtual Visits (Telemedicine).  Patients are able to view lab/test results, encounter notes, upcoming appointments, etc.  Non-urgent messages can be sent to your provider as well. To learn more about what you can do with MyChart, please visit --  [email or web address].

## 2021-07-30 NOTE — Assessment & Plan Note (Signed)
Discussed importance of working towards healthy, gradual weight loss Patient has been on medication in the past, seems to be that he was on phentermine in the past Discussed that repeated use of phentermine often times does not result in as stronger effect as initial administration Discussed option of referral to nutritionist for further recommendations, patient interested, referral placed At future visit, can discuss possible use of GLP-1 receptor agonist Another option would be referral to healthy weight and wellness clinic

## 2021-07-30 NOTE — Progress Notes (Signed)
New Patient Office Visit  Subjective:  Patient ID: David Tanner, male    DOB: Sep 02, 1986  Age: 35 y.o. MRN: [id number]  CC:  Chief Complaint  Patient presents with   Establish Care    Prior PCP 5 years ago. Patient has no specific concerns or complaints today    HPI David Tanner is a 35 year old male presenting to establish in clinic.  No specific concerns today.  Past medical history significant for abnormal TSH, hyperlipidemia, prediabetes.  Abnormal TSH: Review of chart indicates patient has had elevated TSH with normal T4, indicating subclinical hypothyroidism.  Not currently reporting any symptoms suggestive of overt hypothyroidism.  Prediabetes: Review of recent hemoglobin A1c measurements indicates that these have fallen within prediabetes range.  Currently denies any polyuria or polydipsia.  Reports that he has worked on weight loss in the past and at one point was prescribed a medication which may have been phentermine.  Patient works as a [employer] fast streamer.  Outside of work he likes to play basketball.  Past Medical History:  Diagnosis Date   DVT (deep venous thrombosis) (HCC)    Left leg   Hyperlipidemia    Hypothyroid    Vitamin D deficiency     History reviewed. No pertinent surgical history.  Family History  Problem Relation Age of Onset   Hypertension Mother    Cancer Maternal Grandfather        leukemia    Social History   Socioeconomic History   Marital status: Single    Spouse name: Barista   Number of children: Not on file   Years of education: Not on file   Highest education level: Not on file  Occupational History   Occupation: Delivery Driver  Tobacco Use   Smoking status: Former    Packs/day: 0.25    Years: 1.00    Pack years: 0.25    Types: Cigarettes   Smokeless tobacco: Never   Tobacco comments:    Patient states he quit smoking about 2 years ago (approx 2020)  Vaping Use   Vaping Use: Never used  Substance and Sexual  Activity   Alcohol use: No    Alcohol/week: 0.0 standard drinks   Drug use: No   Sexual activity: Yes    Comment: With girlfriend for 4 years  Other Topics Concern   Not on file  Social History Narrative   Lives with wife and 3 kids   Right Handed   Drinks 6-7 cups caffeine   Social Determinants of Health   Financial Resource Strain: Not on file  Food Insecurity: Not on file  Transportation Needs: Not on file  Physical Activity: Not on file  Stress: Not on file  Social Connections: Not on file  Intimate Partner Violence: Not on file    Objective:   Today's Vitals: BP 130/74   Pulse 74   Ht 6' (1.829 m)   Wt (!) 369 lb 1.6 oz (167.4 kg)   SpO2 96%   BMI 50.06 kg/m   Physical Exam  35 year old male in no acute distress Cardiovascular exam with regular rate and rhythm, no murmurs appreciated Lungs clear to auscultation bilaterally  Assessment & Plan:   Problem List Items Addressed This Visit       Other   Obesity, morbid, BMI 50 or higher (HCC) - Primary    Discussed importance of working towards healthy, gradual weight loss Patient has been on medication in the past, seems to be that he was on phentermine  in the past Discussed that repeated use of phentermine often times does not result in as stronger effect as initial administration Discussed option of referral to nutritionist for further recommendations, patient interested, referral placed At future visit, can discuss possible use of GLP-1 receptor agonist Another option would be referral to healthy weight and wellness clinic      Relevant Orders   Amb ref to Medical Nutrition Therapy-MNT   Prediabetes    Most recent hemoglobin A1c found to be within prediabetes range Discussed the meaning of this elevation in A1c as well as the risk conferred At this time, strongly recommend lifestyle modifications to work towards healthy, gradual weight loss which will also reduce risk of various chronic medical  conditions      Relevant Orders   Amb ref to Medical Nutrition Therapy-MNT    Outpatient Encounter Medications as of 07/30/2021  Medication Sig   aspirin EC 81 MG tablet Take 81 mg by mouth 2 (two) times daily.   topiramate (TOPAMAX) 100 MG tablet Take 1 tablet (100 mg total) by mouth 2 (two) times daily.   vitamin B-12 (CYANOCOBALAMIN) 1000 MCG tablet Take 1,000 mcg by mouth daily.   No facility-administered encounter medications on file as of 07/30/2021.    Follow-up: Return in about 2 months (around 09/29/2021).  Follow-up on weight, management considerations, nutritionist referral.  Bethel Sirois J De [place], MD

## 2021-07-30 NOTE — Assessment & Plan Note (Signed)
Most recent hemoglobin A1c found to be within prediabetes range Discussed the meaning of this elevation in A1c as well as the risk conferred At this time, strongly recommend lifestyle modifications to work towards healthy, gradual weight loss which will also reduce risk of various chronic medical conditions

## 2021-09-17 ENCOUNTER — Ambulatory Visit: Payer: Managed Care, Other (non HMO) | Admitting: Neurology

## 2021-09-27 ENCOUNTER — Ambulatory Visit (INDEPENDENT_AMBULATORY_CARE_PROVIDER_SITE_OTHER): Payer: Managed Care, Other (non HMO) | Admitting: Neurology

## 2021-09-27 ENCOUNTER — Encounter: Payer: Self-pay | Admitting: Neurology

## 2021-09-27 DIAGNOSIS — E538 Deficiency of other specified B group vitamins: Secondary | ICD-10-CM | POA: Insufficient documentation

## 2021-09-27 DIAGNOSIS — G932 Benign intracranial hypertension: Secondary | ICD-10-CM | POA: Diagnosis not present

## 2021-09-27 MED ORDER — TOPIRAMATE 100 MG PO TABS: 100.0000 mg | ORAL_TABLET | Freq: Two times a day (BID) | ORAL | 4 refills | Status: DC

## 2021-09-27 NOTE — Progress Notes (Signed)
Chief Complaint  Patient presents with   Follow-up    Rm 14. Alone. PCP is Dr. Kyung Rudd de [place]. Pt denies headaches or visual changes. Patient states Topamax is helpful.      ASSESSMENT AND PLAN  David Tanner is a 35 y.o. male   Benign intracranial hypertension  Normal MRI of the brain and orbit with without contrast  Fluoroscopy guided in September 2022 lumbar puncture showed opening pressure of 46 cmH2O  This is most related to his obesity,  Continue Topamax 100 mg twice a day  Refer to ophthalmology evaluation Vitamin B12 deficiency  Repeat level  DIAGNOSTIC DATA (LABS, IMAGING, TESTING) - I reviewed patient records, labs, notes, testing and imaging myself where available.   MEDICAL HISTORY:  David Tanner is a 35 year old male, seen in request by his primary care physician Dr. Maryan Rued, Loree Fee, for evaluation of bilateral papillary edema, initial evaluation was June 14, 2021,   I reviewed and summarized the referring note.PMHX. Obesity HLD Hypothyroidism Vitamin D deficiency History of left lower extremity DVT following left ankle fracture, wearing boots  He had a history of obesity, continued weight gain of 40 pounds since past 4 years, also had a history of hypothyroidism, was on thyroid supplement, but lost insurance few years back, has been ordered for his medications  In April 2022, during his yearly eye exam at [facility], he was noted to have bilateral papillary edema, was referred to ophthalmologist, seen by Dr. Alanda Slim on March 09, 2021, was noted to have bilateral disc edema, there is no macular, normal vessel, he was referred to emergency room,  Personally reviewed normal MRI brain, orbit w/wo  Laboratory evaluations in 2018 showed negative ANA, complement, RPR, hepatitis C, ANCA, A1c was 5.5, TSH was elevated 7.0, lipid panel LDL 144  He denies frequent headaches, denies visual changes,  UPDATE Sep 27 2021: Follow-up for benign intracranial  hypertension, fluoroscopy guided lumbar puncture on June 25, 2021 showed opening pressure of 46 cmH2O  Is tolerating Topamax 100 mg twice a day well, he denies significant side effect, no significant weight loss, weight is 362 today, he denies significant visual changes  Spinal fluid testing was normal, laboratory evaluation showed normal ESR C-reactive protein, ANA, CMP was elevated creatinine 1.45, normal CBC hemoglobin of 15.4, A1c of 5.9, slight elevated TSH, normal total T4, B12 was decreased to 150, negative RPR, HIV  PHYSICAL EXAM:   Vitals:   09/27/21 0816  BP: 125/81  Pulse: 62  Weight: (!) 362 lb (164.2 kg)  Height: 6' (1.829 m)   Not recorded     Body mass index is 49.1 kg/m.  PHYSICAL EXAMNIATION:  Gen: NAD, conversant, well nourised, well groomed     NEUROLOGICAL EXAM:  MENTAL STATUS: Speech/cognition: Awake, alert, oriented to history taking and casual conversation, overweight   CRANIAL NERVES: CN II: Visual fields are full to confrontation. Pupils are round equal and briskly reactive to light. CN III, IV, VI: extraocular movement are normal. No ptosis. CN V: Facial sensation is intact to light touch CN VII: Face is symmetric with normal eye closure  CN VIII: Hearing is normal to causal conversation. CN IX, X: Phonation is normal. CN XI: Head turning and shoulder shrug are intact  MOTOR: There is no pronator drift of out-stretched arms. Muscle bulk and tone are normal. Muscle strength is normal.  REFLEXES: Reflexes are 2+ and symmetric at the biceps, triceps, knees, and ankles. Plantar responses are flexor.  SENSORY: Intact to light  touch, pinprick and vibratory sensation are intact in fingers and toes.  COORDINATION: There is no trunk or limb dysmetria noted.  GAIT/STANCE: Posture is normal. Gait is steady with normal steps, base, arm swing, and turning. Heel and toe walking are normal. Tandem gait is normal.  Romberg is absent.  REVIEW OF  SYSTEMS:  Full 14 system review of systems performed and notable only for as above All other review of systems were negative.   ALLERGIES: No Known Allergies  HOME MEDICATIONS: Current Outpatient Medications  Medication Sig Dispense Refill   aspirin EC 81 MG tablet Take 81 mg by mouth 2 (two) times daily.     topiramate (TOPAMAX) 100 MG tablet Take 1 tablet (100 mg total) by mouth 2 (two) times daily. 60 tablet 11   vitamin B-12 (CYANOCOBALAMIN) 1000 MCG tablet Take 1,000 mcg by mouth daily.     No current facility-administered medications for this visit.    PAST MEDICAL HISTORY: Past Medical History:  Diagnosis Date   DVT (deep venous thrombosis) (HCC)    Left leg   Hyperlipidemia    Hypothyroid    Vitamin D deficiency     PAST SURGICAL HISTORY: History reviewed. No pertinent surgical history.  FAMILY HISTORY: Family History  Problem Relation Age of Onset   Hypertension Mother    Cancer Maternal Grandfather        leukemia    SOCIAL HISTORY: Social History   Socioeconomic History   Marital status: Single    Spouse name: Engineering geologist   Number of children: Not on file   Years of education: Not on file   Highest education level: Not on file  Occupational History   Occupation: Delivery Driver  Tobacco Use   Smoking status: Former    Packs/day: 0.25    Years: 1.00    Pack years: 0.25    Types: Cigarettes   Smokeless tobacco: Never   Tobacco comments:    Patient states he quit smoking about 2 years ago (approx 2020)  Vaping Use   Vaping Use: Never used  Substance and Sexual Activity   Alcohol use: No    Alcohol/week: 0.0 standard drinks   Drug use: No   Sexual activity: Yes    Comment: With girlfriend for 4 years  Other Topics Concern   Not on file  Social History Narrative   Lives with wife and 3 kids   Right Handed   Drinks 6-7 cups caffeine   Social Determinants of Health   Financial Resource Strain: Not on file  Food Insecurity: Not on file   Transportation Needs: Not on file  Physical Activity: Not on file  Stress: Not on file  Social Connections: Not on file  Intimate Partner Violence: Not on file      Marcial Pacas, M.D. Ph.D.  [facility] [address] [place], [place] [address] Ph: [phone] Fax: [phone]  CC:  de [place], Blondell Reveal, MD [place],  [place] [address]  de [place], Blondell Reveal, MD

## 2021-09-28 ENCOUNTER — Encounter: Payer: Self-pay | Admitting: Neurology

## 2021-09-29 LAB — THYROID PANEL WITH TSH
Free Thyroxine Index: 1.5 (ref 1.2–4.9)
T3 Uptake Ratio: 28 % (ref 24–39)
T4, Total: 5.3 ug/dL (ref 4.5–12.0)
TSH: 6.24 u[IU]/mL — ABNORMAL HIGH (ref 0.450–4.500)

## 2021-09-29 LAB — METHYLMALONIC ACID, SERUM: Methylmalonic Acid: 131 nmol/L (ref 0–378)

## 2021-09-29 LAB — VITAMIN B12: Vitamin B-12: 150 pg/mL — ABNORMAL LOW (ref 232–1245)

## 2021-09-29 LAB — HOMOCYSTEINE: Homocysteine: 11.5 umol/L (ref 0.0–14.5)

## 2021-09-29 LAB — FOLATE: Folate: 7.3 ng/mL (ref >3.0)

## 2021-10-02 NOTE — Telephone Encounter (Signed)
I called the patient concerning his email. Left detailed message that he will need to see his PCP for further evaluation of these particular abnormal labs. His PCP may recommended further testing then treatment. Provided our number to call back with any questions. His PCP is in the [facility] network and will have access to the results.

## 2021-10-03 ENCOUNTER — Other Ambulatory Visit: Payer: Self-pay

## 2021-10-03 ENCOUNTER — Ambulatory Visit (INDEPENDENT_AMBULATORY_CARE_PROVIDER_SITE_OTHER): Payer: Managed Care, Other (non HMO) | Admitting: Family Medicine

## 2021-10-03 ENCOUNTER — Encounter (HOSPITAL_BASED_OUTPATIENT_CLINIC_OR_DEPARTMENT_OTHER): Payer: Self-pay | Admitting: Family Medicine

## 2021-10-03 VITALS — BP 124/80 | HR 70 | Ht 72.0 in | Wt 374.0 lb

## 2021-10-03 DIAGNOSIS — E538 Deficiency of other specified B group vitamins: Secondary | ICD-10-CM | POA: Diagnosis not present

## 2021-10-03 DIAGNOSIS — E038 Other specified hypothyroidism: Secondary | ICD-10-CM | POA: Insufficient documentation

## 2021-10-03 NOTE — Progress Notes (Signed)
° ° °  Procedures performed today:    None.  Independent interpretation of notes and tests performed by another provider:   None.  Brief History, Exam, Impression, and Recommendations:    BP 124/80    Pulse 70    Ht 6' (1.829 m)    Wt (!) 374 lb (169.6 kg)    SpO2 97%    BMI 50.72 kg/m   Subclinical hypothyroidism Reviewed recent thyroid labs - TSH remains slightly above normal range, but with normal T4.  Discussed that this indicates subclinical hypothyroidism.  Patient without any overt signs or symptoms suggesting hypothyroidism. Discussed options of initiating low-dose levothyroxine versus continued intermittent monitoring.  Patient is amenable to continued monitoring.  Plan to repeat thyroid labs in 6 to 12 months or sooner if symptoms develop  Vitamin B12 deficiency Recent labs with neurology found vitamin B12 to be low Discussed findings with patient as well as recommended treatment Discussed findings with patient as well as recommended treatment At this time, will proceed with oral supplementation with vitamin B12 1000-2000 mcg daily.  Would plan to repeat vitamin B12 level in about 8 weeks to assess response to oral supplementation  Morbid obesity (HCC) Has made appointment to establish with nutritionist, this will be on January 14. Has been on phentermine in the past, discussed that repeated courses of phentermine tend to be less effective than initial treatment Did discuss potential use of injectable options We will plan for patient to establish with nutritionist and then decide which pharmacologic intervention to proceed with in addition to lifestyle modifications  Plan for follow-up in 4-6 weeks to review visit with nutritionist, discuss options for pharmacologic treatment of obesity   ___________________________________________ Bradford Cazier de [place], MD, ABFM, CAQSM Primary Care and Sports Medicine [facility]

## 2021-10-03 NOTE — Assessment & Plan Note (Signed)
Recent labs with neurology found vitamin B12 to be low Discussed findings with patient as well as recommended treatment Discussed findings with patient as well as recommended treatment At this time, will proceed with oral supplementation with vitamin B12 1000-2000 mcg daily.  Would plan to repeat vitamin B12 level in about 8 weeks to assess response to oral supplementation

## 2021-10-03 NOTE — Assessment & Plan Note (Signed)
Reviewed recent thyroid labs - TSH remains slightly above normal range, but with normal T4.  Discussed that this indicates subclinical hypothyroidism.  Patient without any overt signs or symptoms suggesting hypothyroidism. Discussed options of initiating low-dose levothyroxine versus continued intermittent monitoring.  Patient is amenable to continued monitoring.  Plan to repeat thyroid labs in 6 to 12 months or sooner if symptoms develop

## 2021-10-03 NOTE — Assessment & Plan Note (Signed)
Has made appointment to establish with nutritionist, this will be on January 14. Has been on phentermine in the past, discussed that repeated courses of phentermine tend to be less effective than initial treatment Did discuss potential use of injectable options We will plan for patient to establish with nutritionist and then decide which pharmacologic intervention to proceed with in addition to lifestyle modifications

## 2021-10-03 NOTE — Patient Instructions (Signed)
°  Medication Instructions:  Your physician has recommended you make the following change in your medication:  -- Start to take a vitamin B12 supplement (OTC) take 1000 - 2000 mg daily --If you need a refill on any your medications before your next appointment, please call your pharmacy first. If no refills are authorized on file call the office.-- Referrals/Procedures/Imaging: Contact [facility] to follow up on referral to ophthalmology  [facility] [address] [place], [place], [place] [address] Phone: [phone]  Follow-Up: Your next appointment:   Your physician recommends that you schedule a follow-up appointment in: 4-6 WEEKS with Dr. de Peru  You will receive a text message or e-mail with a link to a survey about your care and experience with [place] today! We would greatly appreciate your feedback!   Thanks for letting us be apart of your health journey!!  Primary Care and Sports Medicine   Dr. Ceasar Mons Peru   We encourage you to activate your patient portal called "MyChart".  Sign up information is provided on this After Visit Summary.  MyChart is used to connect with patients for Virtual Visits (Telemedicine).  Patients are able to view lab/test results, encounter notes, upcoming appointments, etc.  Non-urgent messages can be sent to your provider as well. To learn more about what you can do with MyChart, please visit --  [email or web address].

## 2021-10-10 ENCOUNTER — Encounter: Payer: Self-pay | Admitting: Registered"

## 2021-10-10 ENCOUNTER — Encounter: Payer: Managed Care, Other (non HMO) | Attending: Family Medicine | Admitting: Registered"

## 2021-10-10 ENCOUNTER — Other Ambulatory Visit: Payer: Self-pay

## 2021-10-10 DIAGNOSIS — Z713 Dietary counseling and surveillance: Secondary | ICD-10-CM | POA: Insufficient documentation

## 2021-10-10 NOTE — Progress Notes (Signed)
Medical Nutrition Therapy  Appointment Start time:  9:22  Appointment End time:  10:19  Primary concerns today: wants to see what will help him  Referral diagnosis: prediabetes, obesity Preferred learning style: no preference indicated Learning readiness: ready, change in progress   NUTRITION ASSESSMENT   Pt states he was referred by PCP for weight loss. Pt states he was trying to find different ways to improve health. States he knows he needs to be more active and have different ways of eating. States he wants to lose weight to better his health after seeing people who are "big" in size, die. States he doesn't typically eat breakfast and not hungry until lunch time. States he and his wife cook and his wife does not cook or eat vegetables. States his children are picky eater; he's the only person in his house who eats vegetables. States he likes to eat salads, will add vegetables to subs and burritos. States he likes to drink 2% milk often.   Reports he works Mon-Fri, 9-6 pm at [employer] States this a source of physical activity right now is moving furniture at work. States 2 years ago he used to play basketball once a week with friends but now there are schedule conflicts.   States he has 4 kids (ages 34, 69, 16 and 65 years old). Reports he will return to PCP to possibly start weight loss medications in addition to making current changes with nutrition and physical activity.    Clinical Medical Hx: hypothyroidism Medications: See list Labs: elevated A1c (5.9) Notable Signs/Symptoms: none reported  Lifestyle & Dietary Hx  Estimated daily fluid intake:  Supplements: See list Sleep: 7-8 hrs/night Stress / self-care: play video game Current average weekly physical activity: none reported  24-Hr Dietary Recall First Meal: skipped Snack:  Second Meal (12 pm): [place] food - boneless ribs + egg roll + Gatorade Snack:  Third Meal (9 pm): bowl of chili beans Snack:  Beverages: 2%  milk, Gatorade   NUTRITION DIAGNOSIS  NB-1.1 Food and nutrition-related knowledge deficit As related to prediabetes.  As evidenced by pt verbalizes incomplete knowledge.   NUTRITION INTERVENTION  Nutrition education (E-1) on the following topics: Pt was educated and counseled on metabolism, importance of not skipping meals, effects of dieting/restriction, how carbohydrates work in the body, prediabetes/diabetes risk factors, A1c ranges for prediabetes/diabetes, role of fiber in eating regimen, and importance of physical activity. Discussed meal/snack planning and how to balance meals/snacks using MyPlate for prediabetes. Pt agreed with goals listed.  Handouts Provided Include  MyPlate for Prediabetes  Learning Style & Readiness for Change Teaching method utilized: Visual & Auditory  Demonstrated degree of understanding via: Teach Back  Barriers to learning/adherence to lifestyle change: work-life balance  Goals Established by Pt Aim to have breakfast ideally within 1-2 hours of waking up.  Try not to go longer than 5 hours without eating. Consume food about every 3-5 hours.  Aim to have frozen vegetables to go along with dinners during the week.  Drink water with meals.  Aim to increase physical activity to 30 min, 1-2 times on the weekend.    MONITORING & EVALUATION Dietary intake, weekly physical activity.  Next Steps  Patient is to follow-up prn.

## 2021-10-10 NOTE — Patient Instructions (Addendum)
-   Aim to have breakfast ideally within 1-2 hours of waking up.   - Try not to go longer than 5 hours without eating. Consume food about every 3-5 hours.   - Aim to have frozen vegetables to go along with dinners during the week.   - Drink water with meals.   - Aim to increase physical activity to 30 min, 1-2 times on the weekend.

## 2021-11-13 ENCOUNTER — Ambulatory Visit (INDEPENDENT_AMBULATORY_CARE_PROVIDER_SITE_OTHER): Payer: Self-pay | Admitting: Family Medicine

## 2021-11-13 ENCOUNTER — Other Ambulatory Visit: Payer: Self-pay

## 2021-11-13 ENCOUNTER — Encounter (HOSPITAL_BASED_OUTPATIENT_CLINIC_OR_DEPARTMENT_OTHER): Payer: Self-pay | Admitting: Family Medicine

## 2021-11-13 VITALS — BP 128/88 | HR 74 | Ht 72.0 in | Wt 374.0 lb

## 2021-11-13 DIAGNOSIS — G932 Benign intracranial hypertension: Secondary | ICD-10-CM

## 2021-11-13 DIAGNOSIS — R7303 Prediabetes: Secondary | ICD-10-CM

## 2021-11-13 MED ORDER — TOPIRAMATE 100 MG PO TABS: 100.0000 mg | ORAL_TABLET | Freq: Two times a day (BID) | ORAL | 1 refills | Status: DC

## 2021-11-13 MED ORDER — SEMAGLUTIDE(0.25 OR 0.5MG/DOS) 2 MG/1.5ML ~~LOC~~ SOPN: PEN_INJECTOR | SUBCUTANEOUS | 0 refills | Status: AC

## 2021-11-13 NOTE — Progress Notes (Signed)
° ° °  Procedures performed today:    None.  Independent interpretation of notes and tests performed by another provider:   None.  Brief History, Exam, Impression, and Recommendations:    BP 128/88    Pulse 74    Ht 6' (1.829 m)    Wt (!) 374 lb (169.6 kg)    SpO2 96%    BMI 50.72 kg/m   Pseudotumor cerebri Follows with neurology, requesting refill of Topamax today Refill sent to pharmacy on file  Morbid obesity [facility] Has met with nutritionist, this went well, will be following up with them in needed Has questions regarding pharmacotherapy options, discussed oral and injectable options.  He is interested in injectable option with GLP-1 receptor agonist as he has a family member who has utilized this to good effect Discussed risks, side effects, potential benefits related to GLP-1 receptor agonist Cautioned on potential for nausea, vomiting, particularly with starting medication and with dosage changes We will initiate patient on Wegovy as long as covered by insurance/not cost prohibitive.  Alternative may be liraglutide if covered by insurance Did discuss potential for lack of coverage and that oral pharmacotherapy may be needed instead Plan follow-up in 4 weeks to monitor response to therapy if able to initiate or to discuss oral options to consider as an alternative  Spent 30 minutes on this patient encounter, including preparation, chart review, face-to-face counseling with patient and coordination of care, and documentation of encounter  Plan follow-up in about 4 weeks to monitor response to therapy If not able to start Wegovy due to insurance or being cost prohibitive, then it may need to consider oral options Plan to check vitamin B12 at next visit   ___________________________________________ Kepler Mccabe de [place], MD, ABFM, CAQSM Primary Care and [place]

## 2021-11-13 NOTE — Assessment & Plan Note (Addendum)
Has met with nutritionist, this went well, will be following up with them in needed Has questions regarding pharmacotherapy options, discussed oral and injectable options.  He is interested in injectable option with GLP-1 receptor agonist as he has a family member who has utilized this to good effect Discussed risks, side effects, potential benefits related to GLP-1 receptor agonist Cautioned on potential for nausea, vomiting, particularly with starting medication and with dosage changes We will initiate patient on Wegovy as long as covered by insurance/not cost prohibitive.  Alternative may be liraglutide if covered by insurance Did discuss potential for lack of coverage and that oral pharmacotherapy may be needed instead Plan follow-up in 4 weeks to monitor response to therapy if able to initiate or to discuss oral options to consider as an alternative

## 2021-11-13 NOTE — Assessment & Plan Note (Signed)
Follows with neurology, requesting refill of Topamax today Refill sent to pharmacy on file

## 2021-11-14 ENCOUNTER — Ambulatory Visit (HOSPITAL_BASED_OUTPATIENT_CLINIC_OR_DEPARTMENT_OTHER): Payer: Managed Care, Other (non HMO) | Admitting: Family Medicine

## 2021-11-15 ENCOUNTER — Telehealth (HOSPITAL_BASED_OUTPATIENT_CLINIC_OR_DEPARTMENT_OTHER): Payer: Self-pay

## 2021-11-15 NOTE — Telephone Encounter (Signed)
Received a notification from patients insurance regarding ozempic prescription His insurance requires all GLP-1 injectables or oral medications have to have a hx of current dx of DM2 Discussed with patient qualifications of the insurance Patient verbalized understanding Will forward to Dr. Tennis Must [place] to advise on potential alternatives

## 2021-12-11 ENCOUNTER — Other Ambulatory Visit: Payer: Self-pay

## 2021-12-11 ENCOUNTER — Ambulatory Visit (HOSPITAL_BASED_OUTPATIENT_CLINIC_OR_DEPARTMENT_OTHER): Payer: Managed Care, Other (non HMO) | Admitting: Family Medicine

## 2021-12-11 ENCOUNTER — Encounter (HOSPITAL_BASED_OUTPATIENT_CLINIC_OR_DEPARTMENT_OTHER): Payer: Self-pay | Admitting: Family Medicine

## 2021-12-11 ENCOUNTER — Ambulatory Visit (HOSPITAL_BASED_OUTPATIENT_CLINIC_OR_DEPARTMENT_OTHER): Payer: BC Managed Care – PPO | Admitting: Family Medicine

## 2021-12-11 VITALS — BP 128/88 | HR 76 | Ht 72.0 in | Wt 373.0 lb

## 2021-12-11 DIAGNOSIS — E538 Deficiency of other specified B group vitamins: Secondary | ICD-10-CM

## 2021-12-11 DIAGNOSIS — R7303 Prediabetes: Secondary | ICD-10-CM

## 2021-12-11 NOTE — Assessment & Plan Note (Signed)
Low vitamin B12 on prior labs, will recheck today ?Patient has been taking over-the-counter supplement consistently, indicates that dose is 1000 mg daily ?

## 2021-12-11 NOTE — Patient Instructions (Signed)
?  Medication Instructions:  ?Your physician recommends that you continue on your current medications as directed. Please refer to the Current Medication list given to you today. ?--If you need a refill on any your medications before your next appointment, please call your pharmacy first. If no refills are authorized on file call the office.-- ?Lab Work: ?Your physician has recommended that you have lab work today: A1C and B12 ?If you have labs (blood work) drawn today and your tests are completely normal, you will receive your results via MyChart message OR a phone call from our staff.  ?Please ensure you check your voicemail in the event that you authorized detailed messages to be left on a delegated number. If you have any lab test that is abnormal or we need to change your treatment, we will call you to review the results. ? ?R ? ?Follow-Up: ?Your next appointment:   ?Your physician recommends that you schedule a follow-up appointment in: 2 month with Dr. de [place] ? ?You will receive a text message or e-mail with a link to a survey about your care and experience with [place] today! We would greatly appreciate your feedback!  ? ?Thanks for letting us be apart of your health journey!!  ?Primary Care and Sports Medicine  ? ?Dr. Marcy Salvo de [place]  ? ?We encourage you to activate your patient portal called "MyChart".  Sign up information is provided on this After Visit Summary.  MyChart is used to connect with patients for Virtual Visits (Telemedicine).  Patients are able to view lab/test results, encounter notes, upcoming appointments, etc.  Non-urgent messages can be sent to your provider as well. To learn more about what you can do with MyChart, please visit --  [email or web address].    ?

## 2021-12-11 NOTE — Assessment & Plan Note (Signed)
Longstanding history of elevated BMI.  Has associated comorbidities of prediabetes, hypertension.  Still attempting to have [facility] authorized, prior authorization to be submitted to insurance company for determination of coverage ?Patient has met with nutritionist previously, continues to work on lifestyle modifications ?Will need to determine if able to start [facility], if not, may need to consider alternative options ?

## 2021-12-11 NOTE — Progress Notes (Signed)
? ? ?  Procedures performed today:   ? ?None. ? ?Independent interpretation of notes and tests performed by another provider:  ? ?None. ? ?Brief History, Exam, Impression, and Recommendations:   ? ?BP 128/88   Pulse 76   Ht 6' (1.829 m)   Wt (!) 373 lb (169.2 kg)   SpO2 95%   BMI 50.59 kg/m?  ? ?Obesity, morbid, BMI 50 or higher (HCC) ?Longstanding history of elevated BMI.  Has associated comorbidities of prediabetes, hypertension.  Still attempting to have Wegovy authorized, prior authorization to be submitted to insurance company for determination of coverage ?Patient has met with nutritionist previously, continues to work on lifestyle modifications ?Will need to determine if able to start Wegovy, if not, may need to consider alternative options ? ?Prediabetes ?Prior hemoglobin A1c about 6 months ago was 5.9%, will check today to assess status, any possible progression ?Continue with lifestyle modifications as related to working towards weight loss ? ?Vitamin B12 deficiency ?Low vitamin B12 on prior labs, will recheck today ?Patient has been taking over-the-counter supplement consistently, indicates that dose is 1000 mg daily ? ?Plan for follow-up in 2 months or sooner if able to start Wegovy ? ? ?___________________________________________ ?Raymond de [place], MD, ABFM, CAQSM ?Primary Care and Sports Medicine ?[facility] ?

## 2021-12-11 NOTE — Assessment & Plan Note (Signed)
Prior hemoglobin A1c about 6 months ago was 5.9%, will check today to assess status, any possible progression ?Continue with lifestyle modifications as related to working towards weight loss ?

## 2021-12-12 LAB — VITAMIN B12: Vitamin B-12: 182 pg/mL — ABNORMAL LOW (ref 232–1245)

## 2021-12-12 LAB — HEMOGLOBIN A1C
Est. average glucose Bld gHb Est-mCnc: 120 mg/dL
Hgb A1c MFr Bld: 5.8 % — ABNORMAL HIGH (ref 4.8–5.6)

## 2021-12-13 ENCOUNTER — Ambulatory Visit (HOSPITAL_BASED_OUTPATIENT_CLINIC_OR_DEPARTMENT_OTHER): Payer: Self-pay | Admitting: Family Medicine

## 2021-12-22 IMAGING — XA DG SPINAL PUNCT LUMBAR DIAG WITH FL CT GUIDANCE
2 series · 2 of 2 positions shown · non-contrast
Comparison: None

CLINICAL DATA: papillary edema

EXAM:
DIAGNOSTIC LUMBAR PUNCTURE UNDER FLUOROSCOPIC GUIDANCE

[Series 1: ortho adipose · 1 of 1 slices shown (1 of 2)]
[im 1/1]
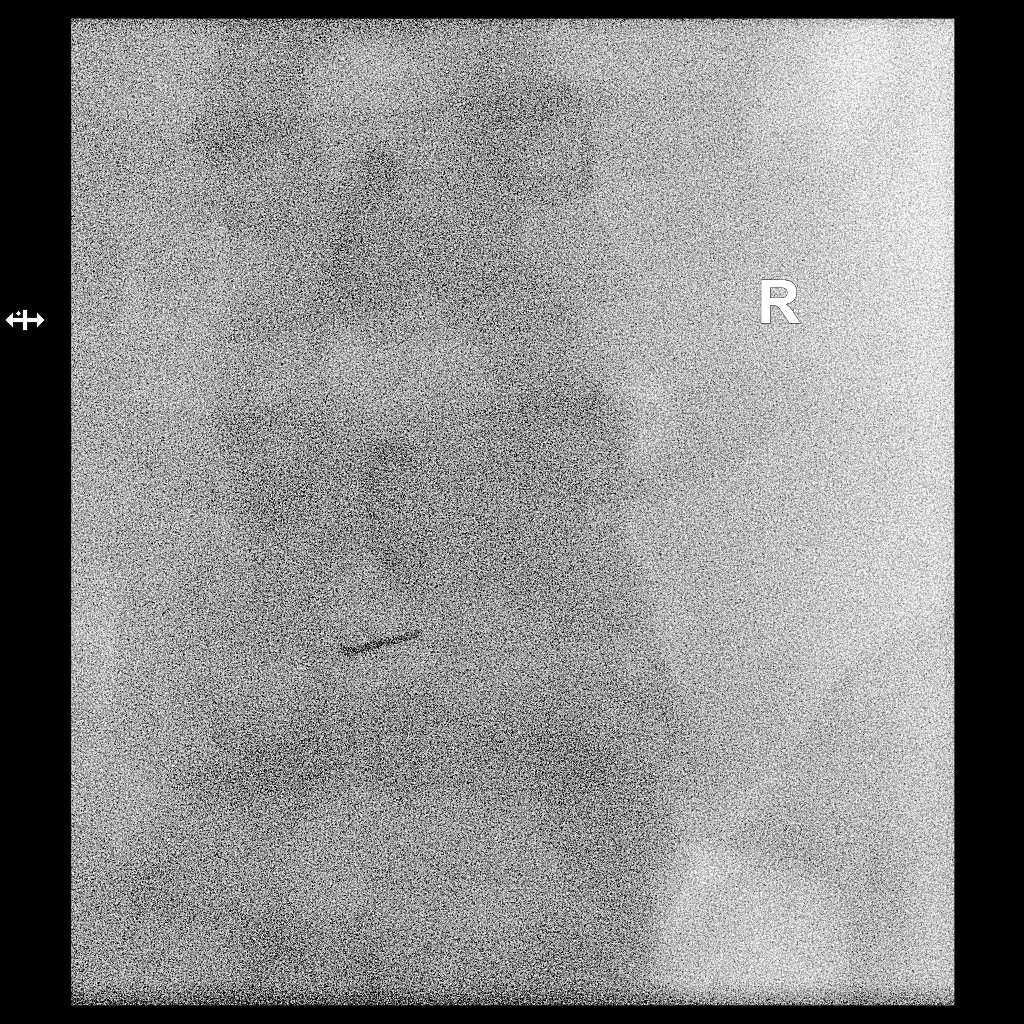

[Series 2: ortho adipose · 1 of 1 slices shown (2 of 2)]
[im 1/1]
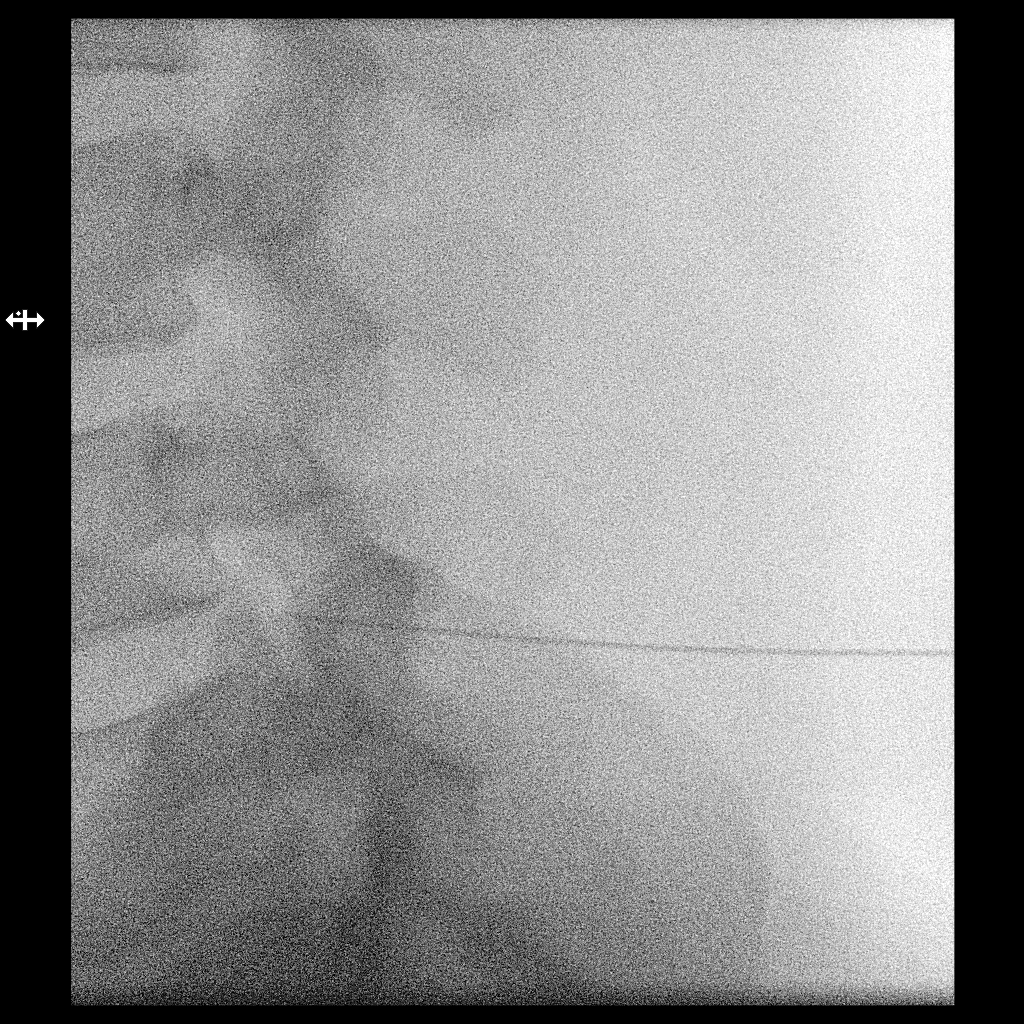

[2 of 2 positions shown; findings below may reference images not displayed]

FLUOROSCOPY TIME:  Fluoroscopy Time: 0 minutes 59 seconds with 2
images

PROCEDURE:
Informed consent was obtained from the patient prior to the
procedure, including potential complications of headache, allergy,
and pain. With the patient prone, the lower back was prepped with
Betadine. 1% Lidocaine was used for local anesthesia. Lumbar
puncture was performed at the L4-L5 level using a 20 gauge gauge
needle with return of clear, colorless CSF with an opening pressure
of 46 cm water. A total of approximately 0.5 ml of CSF were obtained
for laboratory studies. Closing pressure was estimated at 19 cm
water. The patient tolerated the procedure well and there were no
apparent complications.
IMPRESSION: Successful lumbar puncture using fluoroscopic guidance at L4-L5.
Opening pressure was estimated at 46 cm H2O and improved to 19 cm
H2O following large volume LP.

## 2022-02-14 ENCOUNTER — Ambulatory Visit (HOSPITAL_BASED_OUTPATIENT_CLINIC_OR_DEPARTMENT_OTHER): Payer: BC Managed Care – PPO | Admitting: Family Medicine

## 2022-02-14 VITALS — BP 150/96 | HR 72 | Ht 72.0 in | Wt 384.3 lb

## 2022-02-14 DIAGNOSIS — E538 Deficiency of other specified B group vitamins: Secondary | ICD-10-CM

## 2022-02-14 DIAGNOSIS — R7303 Prediabetes: Secondary | ICD-10-CM

## 2022-02-14 DIAGNOSIS — E039 Hypothyroidism, unspecified: Secondary | ICD-10-CM

## 2022-02-14 DIAGNOSIS — I10 Essential (primary) hypertension: Secondary | ICD-10-CM

## 2022-02-14 MED ORDER — LEVOTHYROXINE SODIUM 75 MCG PO TABS: 75.0000 ug | ORAL_TABLET | Freq: Every day | ORAL | 1 refills | Status: DC

## 2022-02-14 NOTE — Progress Notes (Signed)
    Procedures performed today:    None.  Independent interpretation of notes and tests performed by another provider:   None.  Brief History, Exam, Impression, and Recommendations:    BP (!) 150/96   Pulse 72   Ht 6' (1.829 m)   Wt (!) 384 lb 4.8 oz (174.3 kg)   SpO2 98%   BMI 52.12 kg/m   Vitamin B12 deficiency Recent labs indicated that vitamin B12 was still low, recommended that patient increase dose of oral supplementation.  He has been taking 2000 mcg dose of B12 supplement We will check B12 level today in order to determine response  Essential hypertension Blood pressure above goal in office today.  Has been better controlled at prior office visits.  No issues with chest pain or headaches at present.  Not checking blood pressure regularly at home Recommend intermittent monitoring of blood pressure at home, DASH diet We will continue to monitor closely in the office and determine need for initiating pharmacotherapy  Hypothyroidism Discussed options with patient today given history of elevated TSH.  Most recent labs showed normal T4 and T3.  He had been on treatment in the past, however stopped due to lack of insurance.  Discussed recent lab results, potential management options with patient today.  He would like to proceed with levothyroxine therapy today which I feel is reasonable On chart review, it does appear he is taking 75 mcg dose previously, we will initiate levothyroxine at this dose and plan for monitoring of TSH in about 6 to 8 weeks Discussed importance of taking medication pursing in the morning on an empty stomach about 30 minutes before first meal  Prediabetes Most recent hemoglobin A1c checked at last office visit was stable at 5.8% Recommend continuing lifestyle modifications, working on healthy, gradual weight loss Unfortunately insurance does not cover GLP-1 receptor agonist without diagnosis of diabetes Patient will be referred to healthy weight and  wellness clinic for assistance with weight loss measures which will also be beneficial in regards to prediabetes  Obesity, morbid, BMI 50 or higher ([place]) Reportedly, insurance would not cover GLP-1 receptor agonist for weight loss treatment despite severely elevated BMI as well as comorbidities including hypertension, prediabetes Discussed options with patient, he would be interested in referral to healthy weight and wellness clinic, referral placed today  Plan for follow-up in about 6 to 8 weeks or sooner as needed   ___________________________________________ David Tanner de [place], MD, ABFM, CAQSM Primary Care and [place]

## 2022-02-14 NOTE — Assessment & Plan Note (Signed)
Reportedly, insurance would not cover GLP-1 receptor agonist for weight loss treatment despite severely elevated BMI as well as comorbidities including hypertension, prediabetes Discussed options with patient, he would be interested in referral to healthy weight and wellness clinic, referral placed today

## 2022-02-14 NOTE — Assessment & Plan Note (Addendum)
Recent labs indicated that vitamin B12 was still low, recommended that patient increase dose of oral supplementation.  He has been taking 2000 mcg dose of B12 supplement We will check B12 level today in order to determine response

## 2022-02-14 NOTE — Assessment & Plan Note (Addendum)
Discussed options with patient today given history of elevated TSH.  Most recent labs showed normal T4 and T3.  He had been on treatment in the past, however stopped due to lack of insurance.  Discussed recent lab results, potential management options with patient today.  He would like to proceed with levothyroxine therapy today which I feel is reasonable On chart review, it does appear he is taking 75 mcg dose previously, we will initiate levothyroxine at this dose and plan for monitoring of TSH in about 6 to 8 weeks Discussed importance of taking medication pursing in the morning on an empty stomach about 30 minutes before first meal

## 2022-02-14 NOTE — Patient Instructions (Signed)
  Medication Instructions:  Your physician recommends that you continue on your current medications as directed. Please refer to the Current Medication list given to you today. --If you need a refill on any your medications before your next appointment, please call your pharmacy first. If no refills are authorized on file call the office.-- Lab Work: Your physician has recommended that you have lab work today: Yes If you have labs (blood work) drawn today and your tests are completely normal, you will receive your results via MyChart message OR a phone call from our staff.  Please ensure you check your voicemail in the event that you authorized detailed messages to be left on a delegated number. If you have any lab test that is abnormal or we need to change your treatment, we will call you to review the results.  Referrals/Procedures/Imaging: No  Follow-Up: Your next appointment:   Your physician recommends that you schedule a follow-up appointment in: 2 months with Dr. de [place].  You will receive a text message or e-mail with a link to a survey about your care and experience with us today! We would greatly appreciate your feedback!   Thanks for letting us be apart of your health journey!!  Primary Care and Sports Medicine   Dr. Raymond de [place]   We encourage you to activate your patient portal called "MyChart".  Sign up information is provided on this After Visit Summary.  MyChart is used to connect with patients for Virtual Visits (Telemedicine).  Patients are able to view lab/test results, encounter notes, upcoming appointments, etc.  Non-urgent messages can be sent to your provider as well. To learn more about what you can do with MyChart, please visit --  [email or web address].    

## 2022-02-14 NOTE — Assessment & Plan Note (Addendum)
Blood pressure above goal in office today.  Has been better controlled at prior office visits.  No issues with chest pain or headaches at present.  Not checking blood pressure regularly at home Recommend intermittent monitoring of blood pressure at home, DASH diet We will continue to monitor closely in the office and determine need for initiating pharmacotherapy

## 2022-02-14 NOTE — Assessment & Plan Note (Signed)
Most recent hemoglobin A1c checked at last office visit was stable at 5.8% Recommend continuing lifestyle modifications, working on healthy, gradual weight loss Unfortunately insurance does not cover GLP-1 receptor agonist without diagnosis of diabetes Patient will be referred to healthy weight and wellness clinic for assistance with weight loss measures which will also be beneficial in regards to prediabetes

## 2022-02-15 LAB — VITAMIN B12: Vitamin B-12: 150 pg/mL — ABNORMAL LOW (ref 232–1245)

## 2022-03-08 ENCOUNTER — Other Ambulatory Visit (HOSPITAL_BASED_OUTPATIENT_CLINIC_OR_DEPARTMENT_OTHER): Payer: Self-pay | Admitting: Family Medicine

## 2022-03-08 DIAGNOSIS — E039 Hypothyroidism, unspecified: Secondary | ICD-10-CM

## 2022-03-10 ENCOUNTER — Ambulatory Visit (HOSPITAL_COMMUNITY)
Admission: EM | Admit: 2022-03-10 | Discharge: 2022-03-10 | Disposition: A | Discharge status: Home / Self Care | Payer: BC Managed Care – PPO | Attending: Emergency Medicine | Admitting: Emergency Medicine

## 2022-03-10 ENCOUNTER — Encounter (HOSPITAL_COMMUNITY): Payer: Self-pay

## 2022-03-10 DIAGNOSIS — J020 Streptococcal pharyngitis: Secondary | ICD-10-CM

## 2022-03-10 LAB — POCT RAPID STREP A, ED / UC: Streptococcus, Group A Screen (Direct): POSITIVE — AB

## 2022-03-10 MED ORDER — AMOXICILLIN 500 MG PO CAPS: 500.0000 mg | ORAL_CAPSULE | Freq: Two times a day (BID) | ORAL | 0 refills | Status: AC

## 2022-03-10 NOTE — ED Triage Notes (Signed)
Patient presents to Urgent Care with complaints of sore throat since yesterday. Patient reports white spots on tonsils. Pt reports 10/10 pain no medication otc.

## 2022-03-10 NOTE — ED Provider Notes (Signed)
[facility]    CSN: [id number] Arrival date & time: 03/10/22  1622      History   Chief Complaint Chief Complaint  Patient presents with   Sore Throat    HPI David Tanner is a 36 y.o. male.   Patient presents with sore throat for 1 day.  Painful to swallow causing difficulty with food, tolerating liquids.  No known sick contacts.  Has not attempted treatment of symptoms.  Denies fever, chills, body aches, nasal congestion, ear pain, cough, headaches, abdominal pain, nausea, vomiting or diarrhea.    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 No pertinent surgical history.     Home Medications    Prior to Admission medications   Medication Sig Start Date End Date Taking? Authorizing Provider  aspirin EC 81 MG tablet Take 81 mg by mouth 2 (two) times daily.    [provider]  levothyroxine (SYNTHROID) 75 MCG tablet Take 1 tablet (75 mcg total) by mouth daily. 02/14/22   de [place], Buren Kos, MD  topiramate (TOPAMAX) 100 MG tablet Take 1 tablet (100 mg total) by mouth 2 (two) times daily. 11/13/21   [place], Buren Kos, MD  vitamin B-12 (CYANOCOBALAMIN) 1000 MCG tablet Take 1,000 mcg by mouth daily.    [provider]    Family  History Family History  Problem Relation Age of Onset   Hypertension Mother    Cancer Maternal Grandfather        leukemia    Social History Social History   Tobacco Use   Smoking status: Former    Packs/day: 0.25    Years: 1.00    Total pack years: 0.25    Types: Cigarettes   Smokeless tobacco: Never   Tobacco comments:    Patient states he quit smoking about 2 years ago (approx 2020)  Vaping Use   Vaping Use: Never used  Substance Use Topics   Alcohol use: No    Alcohol/week: 0.0 standard drinks of alcohol   Drug use: No     Allergies   Patient has no known allergies.   Review of Systems Review of Systems Defer to HPI    Physical Exam Triage Vital Signs ED Triage Vitals  Enc Vitals Group     BP 03/10/22 1706 (!) 137/95     Pulse Rate 03/10/22 1706 70     Resp 03/10/22 1706 18     Temp 03/10/22 1706 98.6 F (37 C)     Temp Source 03/10/22 1706 Oral     SpO2 03/10/22 1706 98 %     Weight --      Height --      Head Circumference --      Peak  Flow --      Pain Score 03/10/22 1705 10     Pain Loc --      Pain Edu? --      Excl. in GC? --    No data found.  Updated Vital Signs BP (!) 137/95   Pulse 70   Temp 98.6 F (37 C) (Oral)   Resp 18   SpO2 98%   Visual Acuity Right Eye Distance:   Left Eye Distance:   Bilateral Distance:    Right Eye Near:   Left Eye Near:    Bilateral Near:     Physical Exam Constitutional:      Appearance: He is well-developed.  HENT:     Head: Normocephalic.     Right Ear: Tympanic membrane and ear canal normal.     Left Ear: Tympanic membrane and ear canal normal.     Nose: No congestion or rhinorrhea.     Mouth/Throat:     Mouth: Mucous membranes are moist.     Pharynx: Posterior oropharyngeal erythema present.     Tonsils: Tonsillar exudate present. 2+ on the right. 2+ on the left.  Cardiovascular:     Rate and Rhythm: Normal rate and regular rhythm.     Heart sounds: Normal heart sounds.   Pulmonary:     Effort: Pulmonary effort is normal.     Breath sounds: Normal breath sounds.  Musculoskeletal:     Cervical back: Normal range of motion.  Lymphadenopathy:     Cervical: Cervical adenopathy present.  Skin:    General: Skin is warm and dry.  Neurological:     Mental Status: He is alert.  Psychiatric:        Mood and Affect: Mood normal.        Behavior: Behavior normal.      UC Treatments / Results  Labs (all labs ordered are listed, but only abnormal results are displayed) Labs Reviewed  POCT RAPID STREP A, ED / UC - Abnormal; Notable for the following components:      Result Value   Streptococcus, Group A Screen (Direct) POSITIVE (*)    All other components within normal limits  POCT RAPID STREP A, ED / UC    EKG   Radiology No results found.  Procedures Procedures (including critical care time)  Medications Ordered in UC Medications - No data to display  Initial Impression / Assessment and Plan / UC Course  I have reviewed the triage vital signs and the nursing notes.  Pertinent labs & imaging results that were available during my care of the patient were reviewed by me and considered in my medical decision making (see chart for details).  Strep pharyngitis  Confirmed by rapid testing, discussed with patient, vital signs are stable and patient is in no signs of distress, erythema is noted to the oropharynx with tonsillar adenopathy and exudate, amoxicillin 10-day course prescribed, recommended Tylenol, ibuprofen, salt water gargles throat lozenges warm liquids teaspoons of honey and over-the-counter Chloraseptic spray for additional supportive care, may follow-up with urgent care as needed for persisting or worsening symptom Final Clinical Impressions(s) / UC Diagnoses   Final diagnoses:  None   Discharge Instructions   None    ED Prescriptions   None    PDMP not reviewed this encounter.   Valinda Hoar, NP 03/10/22 1736

## 2022-03-10 NOTE — Discharge Instructions (Addendum)
Your rapid strep test today was positive  Take amoxicillin twice a day for the next 10 days, ideally should see improvement in the next 48 hours and steady progression from there  Use Tylenol or ibuprofen every 6 hours for general comfort  May attempt to gargle salt water, use throat lozenges warm liquids soft foods and teaspoons of honey and over-the-counter Chloraseptic spray for support  You may follow-up at urgent care as needed

## 2022-03-15 MED ORDER — CYANOCOBALAMIN 1000 MCG/ML IJ SOLN: 1000.0000 ug | INTRAMUSCULAR | 0 refills | Status: DC

## 2022-03-15 NOTE — Addendum Note (Signed)
Addended by: DE [place], Kamala Kolton J on: 03/15/2022 08:28 AM   Modules accepted: Orders

## 2022-03-15 NOTE — Addendum Note (Signed)
Addended by: DE [place], Huck Ashworth J on: 03/15/2022 08:25 AM   Modules accepted: Orders

## 2022-03-15 NOTE — Progress Notes (Signed)
Called pt and scheduled nurse visit for 6-8 weeks for labs and moved scheduled appt that was for 7/20 to 8/3 due to pt needing thrusdays.

## 2022-04-03 ENCOUNTER — Telehealth: Payer: Self-pay | Admitting: Neurology

## 2022-04-03 NOTE — Telephone Encounter (Signed)
Pt requested to reschedule 7/12 appointment. Appointment has been rescheduled for 8/24

## 2022-04-05 ENCOUNTER — Other Ambulatory Visit (HOSPITAL_BASED_OUTPATIENT_CLINIC_OR_DEPARTMENT_OTHER): Payer: Self-pay | Admitting: Family Medicine

## 2022-04-05 DIAGNOSIS — E039 Hypothyroidism, unspecified: Secondary | ICD-10-CM

## 2022-04-06 ENCOUNTER — Other Ambulatory Visit (HOSPITAL_BASED_OUTPATIENT_CLINIC_OR_DEPARTMENT_OTHER): Payer: Self-pay | Admitting: Family Medicine

## 2022-04-06 DIAGNOSIS — E538 Deficiency of other specified B group vitamins: Secondary | ICD-10-CM

## 2022-04-10 ENCOUNTER — Ambulatory Visit: Payer: Managed Care, Other (non HMO) | Admitting: Neurology

## 2022-04-18 ENCOUNTER — Ambulatory Visit (HOSPITAL_BASED_OUTPATIENT_CLINIC_OR_DEPARTMENT_OTHER): Payer: BC Managed Care – PPO | Admitting: Family Medicine

## 2022-04-25 ENCOUNTER — Ambulatory Visit (HOSPITAL_BASED_OUTPATIENT_CLINIC_OR_DEPARTMENT_OTHER): Payer: BC Managed Care – PPO

## 2022-04-25 DIAGNOSIS — E538 Deficiency of other specified B group vitamins: Secondary | ICD-10-CM

## 2022-04-26 LAB — CBC WITH DIFFERENTIAL/PLATELET
Basophils Absolute: 0.1 10*3/uL (ref 0.0–0.2)
Basos: 1 %
EOS (ABSOLUTE): 0.2 10*3/uL (ref 0.0–0.4)
Eos: 3 %
Hematocrit: 44.6 % (ref 37.5–51.0)
Hemoglobin: 14.7 g/dL (ref 13.0–17.7)
Immature Grans (Abs): 0 10*3/uL (ref 0.0–0.1)
Immature Granulocytes: 1 %
Lymphocytes Absolute: 2.7 10*3/uL (ref 0.7–3.1)
Lymphs: 43 %
MCH: 28.2 pg (ref 26.6–33.0)
MCHC: 33 g/dL (ref 31.5–35.7)
MCV: 86 fL (ref 79–97)
Monocytes Absolute: 0.6 10*3/uL (ref 0.1–0.9)
Monocytes: 10 %
Neutrophils Absolute: 2.5 10*3/uL (ref 1.4–7.0)
Neutrophils: 42 %
Platelets: 309 10*3/uL (ref 150–450)
RBC: 5.21 x10E6/uL (ref 4.14–5.80)
RDW: 14.6 % (ref 11.6–15.4)
WBC: 6 10*3/uL (ref 3.4–10.8)

## 2022-04-26 LAB — VITAMIN B12: Vitamin B-12: 949 pg/mL (ref 232–1245)

## 2022-05-02 ENCOUNTER — Encounter (HOSPITAL_BASED_OUTPATIENT_CLINIC_OR_DEPARTMENT_OTHER): Payer: Self-pay | Admitting: Family Medicine

## 2022-05-02 ENCOUNTER — Ambulatory Visit (HOSPITAL_BASED_OUTPATIENT_CLINIC_OR_DEPARTMENT_OTHER): Payer: BC Managed Care – PPO | Admitting: Family Medicine

## 2022-05-02 VITALS — BP 156/99 | HR 76 | Ht 72.0 in | Wt 384.8 lb

## 2022-05-02 DIAGNOSIS — E538 Deficiency of other specified B group vitamins: Secondary | ICD-10-CM | POA: Diagnosis not present

## 2022-05-02 DIAGNOSIS — E039 Hypothyroidism, unspecified: Secondary | ICD-10-CM | POA: Diagnosis not present

## 2022-05-02 DIAGNOSIS — I10 Essential (primary) hypertension: Secondary | ICD-10-CM

## 2022-05-02 MED ORDER — AMLODIPINE BESYLATE 5 MG PO TABS: 5.0000 mg | ORAL_TABLET | Freq: Every day | ORAL | 1 refills | Status: DC

## 2022-05-02 NOTE — Progress Notes (Signed)
    Procedures performed today:    None.  Independent interpretation of notes and tests performed by another provider:   None.  Brief History, Exam, Impression, and Recommendations:    BP (!) 156/99   Pulse 76   Ht 6' (1.829 m)   Wt (!) 384 lb 12.8 oz (174.5 kg)   SpO2 96%   BMI 52.19 kg/m   Essential hypertension Unfortunately, blood pressure continues to be slightly elevated.  Not currently having any issues with chest pain or headaches.  No vision changes.  He has been working on lifestyle modifications. Given continued elevation, recommend initiating antihypertensive therapy, patient in agreement today.  We will initiate treatment with amlodipine 5 mg once daily, discussed potential risks and side effects. Recommend intermittent monitoring of blood pressure at home, DASH diet We will plan for close follow-up to assess response to medication and to ensure tolerability.  Hypothyroidism Patient continues with levothyroxine 75 mcg daily.  He has been taking this for about 8 to 10 weeks.  Generally has been tolerating medication well, denies any current issues with temperature intolerance, changes in bowel habits, sweats, tremors.  No issues with chest pain or palpitations.  He is due for recheck of TSH today to assess appropriateness of levothyroxine dose.  We will check TSH today and plan to adjust levothyroxine dose as indicated  Vitamin B12 deficiency Most recent vitamin B12 level was found to be within normal range, near upper limit of normal.  He has been utilizing intramuscular injections of vitamin B12 which he has responded much better to as compared to oral supplementation.  Given notable improvement, recommend completing current treatment course with IM B12 and once this is completed, may transition to oral supplementation with continued monitoring thereafter  Return in about 6 weeks (around 06/13/2022).   ___________________________________________ Hy Swiatek de [place], MD,  ABFM, CAQSM Primary Care and Sports Medicine [facility]

## 2022-05-02 NOTE — Patient Instructions (Signed)
  Medication Instructions:  Your physician recommends that you continue on your current medications as directed. Please refer to the Current Medication list given to you today. --If you need a refill on any your medications before your next appointment, please call your pharmacy first. If no refills are authorized on file call the office.-- Lab Work: Your physician has recommended that you have lab work today: Yes If you have labs (blood work) drawn today and your tests are completely normal, you will receive your results via MyChart message OR a phone call from our staff.  Please ensure you check your voicemail in the event that you authorized detailed messages to be left on a delegated number. If you have any lab test that is abnormal or we need to change your treatment, we will call you to review the results.  Referrals/Procedures/Imaging: Yes  Follow-Up: Your next appointment:   Your physician recommends that you schedule a follow-up appointment in: 4-6 weeks with Dr. de [place].  You will receive a text message or e-mail with a link to a survey about your care and experience with us today! We would greatly appreciate your feedback!   Thanks for letting us be apart of your health journey!!  Primary Care and Sports Medicine   Dr. Raymond de [place]   We encourage you to activate your patient portal called "MyChart".  Sign up information is provided on this After Visit Summary.  MyChart is used to connect with patients for Virtual Visits (Telemedicine).  Patients are able to view lab/test results, encounter notes, upcoming appointments, etc.  Non-urgent messages can be sent to your provider as well. To learn more about what you can do with MyChart, please visit --  [email or web address]    

## 2022-05-04 ENCOUNTER — Other Ambulatory Visit (HOSPITAL_BASED_OUTPATIENT_CLINIC_OR_DEPARTMENT_OTHER): Payer: Self-pay | Admitting: Family Medicine

## 2022-05-04 DIAGNOSIS — E538 Deficiency of other specified B group vitamins: Secondary | ICD-10-CM

## 2022-05-06 ENCOUNTER — Other Ambulatory Visit (HOSPITAL_BASED_OUTPATIENT_CLINIC_OR_DEPARTMENT_OTHER): Payer: Self-pay | Admitting: Family Medicine

## 2022-05-06 DIAGNOSIS — E039 Hypothyroidism, unspecified: Secondary | ICD-10-CM

## 2022-05-07 LAB — TSH: TSH: 3.82 u[IU]/mL (ref 0.450–4.500)

## 2022-05-14 NOTE — Assessment & Plan Note (Signed)
Patient continues with levothyroxine 75 mcg daily.  He has been taking this for about 8 to 10 weeks.  Generally has been tolerating medication well, denies any current issues with temperature intolerance, changes in bowel habits, sweats, tremors.  No issues with chest pain or palpitations.  He is due for recheck of TSH today to assess appropriateness of levothyroxine dose.  We will check TSH today and plan to adjust levothyroxine dose as indicated

## 2022-05-14 NOTE — Assessment & Plan Note (Signed)
Most recent vitamin B12 level was found to be within normal range, near upper limit of normal.  He has been utilizing intramuscular injections of vitamin B12 which he has responded much better to as compared to oral supplementation.  Given notable improvement, recommend completing current treatment course with IM B12 and once this is completed, may transition to oral supplementation with continued monitoring thereafter

## 2022-05-14 NOTE — Assessment & Plan Note (Signed)
Unfortunately, blood pressure continues to be slightly elevated.  Not currently having any issues with chest pain or headaches.  No vision changes.  He has been working on lifestyle modifications. Given continued elevation, recommend initiating antihypertensive therapy, patient in agreement today.  We will initiate treatment with amlodipine 5 mg once daily, discussed potential risks and side effects. Recommend intermittent monitoring of blood pressure at home, DASH diet We will plan for close follow-up to assess response to medication and to ensure tolerability.

## 2022-05-22 NOTE — Progress Notes (Unsigned)
Patient: David Tanner Date of Birth: Jan 30, 1986  Reason for Visit: Follow up History from: Patient Primary Neurologist: Dr.Yan  ASSESSMENT AND PLAN 36 y.o. year old male   23.  Benign intracranial hypertension 2.  Morbid obesity, BMI 52  -Normal MRI of the brain and orbit with without contrast  -Fluoroscopy guided in September 2022 lumbar puncture showed opening pressure of 46 cmH2O -Continue Topamax 100 mg twice a day -Is due for ophthalmology follow-up, instructed to call and schedule an appointment -Has gained 24 pounds in the last 6 months, have discussed that weight loss is essential to management of this condition, talked about discussing weight loss options with his PCP, candidate for injectable medication? -Follow-up with me in 6 months or sooner if needed  3.  B12 deficiency -On injection, PCP is managing  HISTORY  David Tanner is a 36 year old male, seen in request by his primary care physician Dr. Maryan Rued, Loree Fee, for evaluation of bilateral papillary edema, initial evaluation was June 14, 2021,   I reviewed and summarized the referring note.PMHX. Obesity HLD Hypothyroidism Vitamin D deficiency History of left lower extremity DVT following left ankle fracture, wearing boots   He had a history of obesity, continued weight gain of 40 pounds since past 4 years, also had a history of hypothyroidism, was on thyroid supplement, but lost insurance few years back, has been ordered for his medications  In April 2022, during his yearly eye exam at [facility], he was noted to have bilateral papillary edema, was referred to ophthalmologist, seen by Dr. Alanda Slim on March 09, 2021, was noted to have bilateral disc edema, there is no macular, normal vessel, he was referred to emergency room,  Personally reviewed normal MRI brain, orbit w/wo  Laboratory evaluations in 2018 showed negative ANA, complement, RPR, hepatitis C, ANCA, A1c was 5.5, TSH was elevated 7.0, lipid  panel LDL 144   He denies frequent headaches, denies visual changes,   UPDATE Sep 27 2021: Follow-up for benign intracranial hypertension, fluoroscopy guided lumbar puncture on June 25, 2021 showed opening pressure of 46 cmH2O  Is tolerating Topamax 100 mg twice a day well, he denies significant side effect, no significant weight loss, weight is 362 today, he denies significant visual changes  Spinal fluid testing was normal, laboratory evaluation showed normal ESR C-reactive protein, ANA, CMP was elevated creatinine 1.45, normal CBC hemoglobin of 15.4, A1c of 5.9, slight elevated TSH, normal total T4, B12 was decreased to 150, negative RPR, HIV  Update May 23, 2022 SS: Remains on Topamax 100 mg twice daily, no problems. No headaches or vision changes. Weight is 386 lbs, up 24 lbs since Dec 2022, claims tries to work out.  On B12 injections.  Not sure when he last saw his eye doctor.  REVIEW OF SYSTEMS: Out of a complete 14 system review of symptoms, the patient complains only of the following symptoms, and all other reviewed systems are negative.  See HPI  ALLERGIES: No Known Allergies  HOME MEDICATIONS: Outpatient Medications Prior to Visit  Medication Sig Dispense Refill   amLODipine (NORVASC) 5 MG tablet Take 1 tablet (5 mg total) by mouth daily. 30 tablet 1   aspirin EC 81 MG tablet Take 81 mg by mouth 2 (two) times daily.     cyanocobalamin (VITAMIN B12) 1000 MCG/ML injection INJECT 1 ML (1,000 MCG TOTAL) INTO THE MUSCLE ONCE A WEEK. 4 mL 0   levothyroxine (SYNTHROID) 75 MCG tablet TAKE 1 TABLET BY MOUTH EVERY DAY  30 tablet 1   vitamin B-12 (CYANOCOBALAMIN) 1000 MCG tablet Take 1,000 mcg by mouth daily.     topiramate (TOPAMAX) 100 MG tablet Take 1 tablet (100 mg total) by mouth 2 (two) times daily. 180 tablet 1   No facility-administered medications prior to visit.    PAST MEDICAL HISTORY: Past Medical History:  Diagnosis Date   DVT (deep venous thrombosis) (HCC)     Left leg   Hyperlipidemia    Hypothyroid    Vitamin D deficiency     PAST SURGICAL HISTORY: History reviewed. No pertinent surgical history.  FAMILY HISTORY: Family History  Problem Relation Age of Onset   Hypertension Mother    Cancer Maternal Grandfather        leukemia    SOCIAL HISTORY: Social History   Socioeconomic History   Marital status: Single    Spouse name: Engineering geologist   Number of children: Not on file   Years of education: Not on file   Highest education level: Not on file  Occupational History   Occupation: Delivery Driver  Tobacco Use   Smoking status: Former    Packs/day: 0.25    Years: 1.00    Total pack years: 0.25    Types: Cigarettes   Smokeless tobacco: Never   Tobacco comments:    Patient states he quit smoking about 2 years ago (approx 2020)  Vaping Use   Vaping Use: Never used  Substance and Sexual Activity   Alcohol use: No    Alcohol/week: 0.0 standard drinks of alcohol   Drug use: No   Sexual activity: Yes    Comment: With girlfriend for 4 years  Other Topics Concern   Not on file  Social History Narrative   Lives with wife and 3 kids   Right Handed   Drinks 6-7 cups caffeine   Social Determinants of Health   Financial Resource Strain: Not on file  Food Insecurity: No Food Insecurity (10/10/2021)   Hunger Vital Sign    Worried About Running Out of Food in the Last Year: Never true    Ran Out of Food in the Last Year: Never true  Transportation Needs: Not on file  Physical Activity: Not on file  Stress: Not on file  Social Connections: Not on file  Intimate Partner Violence: Not on file   PHYSICAL EXAM  Vitals:   05/23/22 1251  BP: (!) 144/94  Pulse: 64  Weight: (!) 386 lb 8 oz (175.3 kg)  Height: 6' (1.829 m)   Body mass index is 52.42 kg/m.  Generalized: Well developed, in no acute distress  Neurological examination  Mentation: Alert oriented to time, place, history taking. Follows all commands speech and  language fluent Cranial nerve II-XII: Pupils were equal round reactive to light. Extraocular movements were full, visual field were full on confrontational test. Facial sensation and strength were normal. Head turning and shoulder shrug were normal and symmetric. Motor: The motor testing reveals 5 over 5 strength of all 4 extremities. Good symmetric motor tone is noted throughout.  Sensory: Sensory testing is intact to soft touch on all 4 extremities. No evidence of extinction is noted.  Coordination: Cerebellar testing reveals good finger-nose-finger and heel-to-shin bilaterally.  Gait and station: Gait is normal. Reflexes: Deep tendon reflexes are symmetric and normal bilaterally.   DIAGNOSTIC DATA (LABS, IMAGING, TESTING) - I reviewed patient records, labs, notes, testing and imaging myself where available.  Lab Results  Component Value Date   WBC 6.0 04/25/2022  HGB 14.7 04/25/2022   HCT 44.6 04/25/2022   MCV 86 04/25/2022   PLT 309 04/25/2022      Component Value Date/Time   NA 142 06/14/2021 1000   K 4.7 06/14/2021 1000   CL 102 06/14/2021 1000   CO2 22 06/14/2021 1000   GLUCOSE 92 06/14/2021 1000   GLUCOSE 87 03/09/2021 1305   BUN 11 06/14/2021 1000   CREATININE 1.45 (H) 06/14/2021 1000   CREATININE 1.32 08/25/2017 1655   CALCIUM 9.8 06/14/2021 1000   PROT 6.8 06/14/2021 1000   ALBUMIN 4.7 06/14/2021 1000   AST 21 06/14/2021 1000   ALT 20 06/14/2021 1000   ALKPHOS 50 06/14/2021 1000   BILITOT 0.3 06/14/2021 1000   GFRNONAA 72 08/25/2017 1655   GFRAA 84 08/25/2017 1655   Lab Results  Component Value Date   CHOL 221 (H) 05/20/2017   HDL 46 05/20/2017   LDLCALC 144 (H) 05/20/2017   TRIG 178 (H) 05/20/2017   CHOLHDL 4.8 05/20/2017   Lab Results  Component Value Date   HGBA1C 5.8 (H) 12/11/2021   Lab Results  Component Value Date   [id number] 944 04/25/2022   Lab Results  Component Value Date   TSH 3.820 05/02/2022    Butler Denmark, AGNP-C, DNP 05/23/2022,  1:07 PM [facility] [address] [place], [place] [address] [phone]

## 2022-05-23 ENCOUNTER — Encounter: Payer: Self-pay | Admitting: Neurology

## 2022-05-23 ENCOUNTER — Encounter (HOSPITAL_BASED_OUTPATIENT_CLINIC_OR_DEPARTMENT_OTHER): Payer: Self-pay | Admitting: Family Medicine

## 2022-05-23 ENCOUNTER — Ambulatory Visit (INDEPENDENT_AMBULATORY_CARE_PROVIDER_SITE_OTHER): Payer: BC Managed Care – PPO | Admitting: Neurology

## 2022-05-23 VITALS — BP 144/94 | HR 64 | Ht 72.0 in | Wt 386.5 lb

## 2022-05-23 DIAGNOSIS — G932 Benign intracranial hypertension: Secondary | ICD-10-CM | POA: Diagnosis not present

## 2022-05-23 MED ORDER — TOPIRAMATE 100 MG PO TABS: 100.0000 mg | ORAL_TABLET | Freq: Two times a day (BID) | ORAL | 1 refills | Status: DC

## 2022-05-23 NOTE — Patient Instructions (Signed)
Call Dr. Genia Del to schedule eye exam, you are due Discuss weight loss options with your primary care doctor, injectable medications? Continue the Topamax  Call for any worsening symptoms

## 2022-06-04 ENCOUNTER — Other Ambulatory Visit (HOSPITAL_BASED_OUTPATIENT_CLINIC_OR_DEPARTMENT_OTHER): Payer: Self-pay | Admitting: Family Medicine

## 2022-06-04 DIAGNOSIS — E538 Deficiency of other specified B group vitamins: Secondary | ICD-10-CM

## 2022-06-06 ENCOUNTER — Other Ambulatory Visit (HOSPITAL_BASED_OUTPATIENT_CLINIC_OR_DEPARTMENT_OTHER): Payer: Self-pay | Admitting: Family Medicine

## 2022-06-06 DIAGNOSIS — E039 Hypothyroidism, unspecified: Secondary | ICD-10-CM

## 2022-06-06 DIAGNOSIS — I10 Essential (primary) hypertension: Secondary | ICD-10-CM

## 2022-06-13 ENCOUNTER — Ambulatory Visit (HOSPITAL_BASED_OUTPATIENT_CLINIC_OR_DEPARTMENT_OTHER): Payer: BC Managed Care – PPO | Admitting: Family Medicine

## 2022-06-19 ENCOUNTER — Other Ambulatory Visit (HOSPITAL_BASED_OUTPATIENT_CLINIC_OR_DEPARTMENT_OTHER): Payer: Self-pay

## 2022-06-19 ENCOUNTER — Other Ambulatory Visit (HOSPITAL_BASED_OUTPATIENT_CLINIC_OR_DEPARTMENT_OTHER): Payer: Self-pay | Admitting: Family Medicine

## 2022-06-19 DIAGNOSIS — E538 Deficiency of other specified B group vitamins: Secondary | ICD-10-CM

## 2022-06-21 ENCOUNTER — Other Ambulatory Visit (HOSPITAL_BASED_OUTPATIENT_CLINIC_OR_DEPARTMENT_OTHER): Payer: Self-pay | Admitting: Family Medicine

## 2022-06-21 DIAGNOSIS — E039 Hypothyroidism, unspecified: Secondary | ICD-10-CM

## 2022-06-21 DIAGNOSIS — I10 Essential (primary) hypertension: Secondary | ICD-10-CM

## 2022-06-26 ENCOUNTER — Ambulatory Visit (HOSPITAL_BASED_OUTPATIENT_CLINIC_OR_DEPARTMENT_OTHER): Payer: BC Managed Care – PPO | Admitting: Family Medicine

## 2022-07-10 ENCOUNTER — Ambulatory Visit (HOSPITAL_BASED_OUTPATIENT_CLINIC_OR_DEPARTMENT_OTHER): Payer: BC Managed Care – PPO | Admitting: Family Medicine

## 2022-07-10 ENCOUNTER — Encounter (HOSPITAL_BASED_OUTPATIENT_CLINIC_OR_DEPARTMENT_OTHER): Payer: Self-pay

## 2022-07-16 ENCOUNTER — Ambulatory Visit (INDEPENDENT_AMBULATORY_CARE_PROVIDER_SITE_OTHER): Payer: BC Managed Care – PPO | Admitting: Family Medicine

## 2022-07-16 ENCOUNTER — Encounter (HOSPITAL_BASED_OUTPATIENT_CLINIC_OR_DEPARTMENT_OTHER): Payer: Self-pay | Admitting: Family Medicine

## 2022-07-16 DIAGNOSIS — I10 Essential (primary) hypertension: Secondary | ICD-10-CM | POA: Diagnosis not present

## 2022-07-16 DIAGNOSIS — E538 Deficiency of other specified B group vitamins: Secondary | ICD-10-CM | POA: Diagnosis not present

## 2022-07-16 DIAGNOSIS — E039 Hypothyroidism, unspecified: Secondary | ICD-10-CM | POA: Diagnosis not present

## 2022-07-16 MED ORDER — TOPIRAMATE 100 MG PO TABS: 100.0000 mg | ORAL_TABLET | Freq: Two times a day (BID) | ORAL | 1 refills | Status: DC

## 2022-07-16 MED ORDER — LEVOTHYROXINE SODIUM 75 MCG PO TABS: 75.0000 ug | ORAL_TABLET | Freq: Every day | ORAL | 1 refills | Status: DC

## 2022-07-16 MED ORDER — AMLODIPINE BESYLATE 5 MG PO TABS: 5.0000 mg | ORAL_TABLET | Freq: Every day | ORAL | 1 refills | Status: DC

## 2022-07-16 NOTE — Assessment & Plan Note (Signed)
Had previously administered IM B12 injections which had improved B12 level.  We will recheck B12 levels today.  If B12 is at goal, would recommend proceeding with oral B12 supplement at 1000 mcg daily.  If B12 level is low, would resume B12 injections to replenish B12 stores

## 2022-07-16 NOTE — Patient Instructions (Signed)
  Medication Instructions:  Your physician recommends that you continue on your current medications as directed. Please refer to the Current Medication list given to you today. --If you need a refill on any your medications before your next appointment, please call your pharmacy first. If no refills are authorized on file call the office.-- Lab Work: Your physician has recommended that you have lab work today: Yes If you have labs (blood work) drawn today and your tests are completely normal, you will receive your results via [place] a phone call from our staff.  Please ensure you check your voicemail in the event that you authorized detailed messages to be left on a delegated number. If you have any lab test that is abnormal or we need to change your treatment, we will call you to review the results.  Referrals/Procedures/Imaging: No  Follow-Up: Your next appointment:   Your physician recommends that you schedule a follow-up appointment in: 6 weeks follow-up with Dr. de [place].  You will receive a text message or e-mail with a link to a survey about your care and experience with [place] today! We would greatly appreciate your feedback!   Thanks for letting us be apart of your health journey!!  Primary Care and Sports Medicine   Dr. Arlina Robes [place]   We encourage you to activate your patient portal called "MyChart".  Sign up information is provided on this After Visit Summary.  MyChart is used to connect with patients for Virtual Visits (Telemedicine).  Patients are able to view lab/test results, encounter notes, upcoming appointments, etc.  Non-urgent messages can be sent to your provider as well. To learn more about what you can do with MyChart, please visit --  [email or web address].

## 2022-07-16 NOTE — Assessment & Plan Note (Signed)
Previously referred to [facility], however patient was not able to schedule visit at that time due to him moving and not having the money to pay initial fee.  At this time, he does feel that he would be able to move forward with establishing with them, does need to have number for clinic so he may call to schedule appointment.  It also appears that the referral was closed, we will place a new referral for patient to be able to contact them and set up establishing visit In the past, we have tried to start [facility], however this is not covered by insurance Recommend continuing with lifestyle modifications

## 2022-07-16 NOTE — Assessment & Plan Note (Signed)
Blood pressure is elevated in office today, he indicates that he has not taken his blood pressure medication this morning and has been without it for some time now as he moved and cannot find the medication.  He denies any issues with chest pain or headaches.  Has not been checking blood pressure at home We will refill medication today so that she may resume taking it.  Discussed importance of continuing with medication regularly and if he does have an issue with the medication, to let us know and we can work to ensure that he has medication on hand Recommend intermittent monitoring at home, DASH diet We will plan for close follow-up to reassess blood pressure with patient being on medication

## 2022-07-16 NOTE — Assessment & Plan Note (Signed)
Continues with levothyroxine, most recent TSH was normal at current dose He denies any issues with fatigue, changes in bowel habits, temperature intolerance We will continue with current dose of levothyroxine, refilled today

## 2022-07-16 NOTE — Progress Notes (Signed)
    Procedures performed today:    None.  Independent interpretation of notes and tests performed by another provider:   None.  Brief History, Exam, Impression, and Recommendations:    BP (!) 158/106   Pulse 68   Temp 97.8 F (36.6 C) (Oral)   Ht 6' (1.829 m)   Wt (!) 380 lb 6.4 oz (172.5 kg)   SpO2 100%   BMI 51.59 kg/m   Essential hypertension Blood pressure is elevated in office today, he indicates that he has not taken his blood pressure medication this morning and has been without it for some time now as he moved and cannot find the medication.  He denies any issues with chest pain or headaches.  Has not been checking blood pressure at home We will refill medication today so that she may resume taking it.  Discussed importance of continuing with medication regularly and if he does have an issue with the medication, to let us know and we can work to ensure that he has medication on hand Recommend intermittent monitoring at home, DASH diet We will plan for close follow-up to reassess blood pressure with patient being on medication  Hypothyroidism Continues with levothyroxine, most recent TSH was normal at current dose He denies any issues with fatigue, changes in bowel habits, temperature intolerance We will continue with current dose of levothyroxine, refilled today  Obesity, morbid, BMI 50 or higher ([place]) Previously referred to healthy weight and wellness clinic, however patient was not able to schedule visit at that time due to him moving and not having the money to pay initial fee.  At this time, he does feel that he would be able to move forward with establishing with them, does need to have number for clinic so he may call to schedule appointment.  It also appears that the referral was closed, we will place a new referral for patient to be able to contact them and set up establishing visit In the past, we have tried to start [facility], however this is not covered by  insurance Recommend continuing with lifestyle modifications  Vitamin B12 deficiency Had previously administered IM B12 injections which had improved B12 level.  We will recheck B12 levels today.  If B12 is at goal, would recommend proceeding with oral B12 supplement at 1000 mcg daily.  If B12 level is low, would resume B12 injections to replenish B12 stores  Return in about 6 weeks (around 08/27/2022) for HTN, weight, B12. Recommend seasonal flu vaccine, declined today   ___________________________________________ Lezlie Ritchey de [place], MD, ABFM, CA[facility] [place]

## 2022-07-17 LAB — VITAMIN B12: Vitamin B-12: 402 pg/mL (ref 232–1245)

## 2022-08-14 ENCOUNTER — Encounter (INDEPENDENT_AMBULATORY_CARE_PROVIDER_SITE_OTHER): Payer: Self-pay

## 2022-08-19 ENCOUNTER — Ambulatory Visit (HOSPITAL_BASED_OUTPATIENT_CLINIC_OR_DEPARTMENT_OTHER): Payer: BC Managed Care – PPO | Admitting: Family Medicine

## 2022-08-28 ENCOUNTER — Encounter (HOSPITAL_BASED_OUTPATIENT_CLINIC_OR_DEPARTMENT_OTHER): Payer: Self-pay

## 2022-11-08 ENCOUNTER — Telehealth (HOSPITAL_BASED_OUTPATIENT_CLINIC_OR_DEPARTMENT_OTHER): Payer: Self-pay | Admitting: Family Medicine

## 2022-11-08 NOTE — Telephone Encounter (Signed)
Pt advised NO to the FLU Vaccine

## 2022-11-12 ENCOUNTER — Ambulatory Visit (INDEPENDENT_AMBULATORY_CARE_PROVIDER_SITE_OTHER): Payer: BC Managed Care – PPO | Admitting: Family Medicine

## 2022-11-12 ENCOUNTER — Encounter (INDEPENDENT_AMBULATORY_CARE_PROVIDER_SITE_OTHER): Payer: Self-pay | Admitting: Family Medicine

## 2022-11-12 VITALS — BP 137/84 | HR 86 | Temp 98.0°F | Ht 71.0 in | Wt 380.0 lb

## 2022-11-12 DIAGNOSIS — Z6841 Body Mass Index (BMI) 40.0 and over, adult: Secondary | ICD-10-CM

## 2022-11-12 DIAGNOSIS — R7303 Prediabetes: Secondary | ICD-10-CM

## 2022-11-12 DIAGNOSIS — I10 Essential (primary) hypertension: Secondary | ICD-10-CM | POA: Diagnosis not present

## 2022-11-12 DIAGNOSIS — Z0289 Encounter for other administrative examinations: Secondary | ICD-10-CM

## 2022-11-12 DIAGNOSIS — E785 Hyperlipidemia, unspecified: Secondary | ICD-10-CM

## 2022-11-12 NOTE — Assessment & Plan Note (Signed)
Lab Results  Component Value Date   HGBA1C 5.8 (H) 12/11/2021    Reviewed most recent A1c at 5.8.  Patient is at high risk for type 2 diabetes with a BMI of 53 and a high visceral fat rating.  He is consuming excess carbohydrate and sugar intake with a lack of regular exercise.  Will look for improvement in A1c levels and plan to recheck A1c level at his next visit.

## 2022-11-12 NOTE — Assessment & Plan Note (Signed)
Currently not on any lipid-lowering treatment.  Hope to see improvement in his cholesterol readings with weight reduction.  Will be planning to start a low saturated fat, low trans fat diet once he officially starts our program.  We discussed adding in more regular exercise.  Lab Results  Component Value Date   CHOL 221 (H) 05/20/2017   HDL 46 05/20/2017   LDLCALC 144 (H) 05/20/2017   TRIG 178 (H) 05/20/2017   CHOLHDL 4.8 05/20/2017

## 2022-11-12 NOTE — Progress Notes (Signed)
Office: [phone]  /  Fax: [phone]   Initial Visit  David Tanner was seen in clinic today to evaluate for obesity. He is interested in losing weight to improve overall health and reduce the risk of weight related complications. He presents today to review program treatment options, initial physical assessment, and evaluation.     He was referred by: PCP  When asked what else they would like to accomplish? He states: Adopt healthier eating patterns and Reduce number of medications Gained 100 lb in 10 years Wants to be 280 lb  When asked how has your weight affected you? 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  Peak Weight:380 lb , Body Fat%:43.8, Visceral Fat Rating:30, Weight trend over the last 12 months: Increasing  General:  Alert, oriented and cooperative. Patient is in no acute distress.  Respiratory: Normal respiratory effort, no problems with respiration noted  Extremities: Normal range of motion.    Mental Status: Normal mood and affect. Normal behavior. Normal judgment and thought content.   DIAGNOSTIC DATA REVIEWED:  BMET    Component Value  Date/Time   NA 142 06/14/2021 1000   K 4.7 06/14/2021 1000   CL 102 06/14/2021 1000   CO2 22 06/14/2021 1000   GLUCOSE 92 06/14/2021 1000   GLUCOSE 87 03/09/2021 1305   BUN 11 06/14/2021 1000   CREATININE 1.45 (H) 06/14/2021 1000   CREATININE 1.32 08/25/2017 1655   CALCIUM 9.8 06/14/2021 1000   GFRNONAA 72 08/25/2017 1655   GFRAA 84 08/25/2017 1655   Lab Results  Component Value Date   HGBA1C 5.8 (H) 12/11/2021   HGBA1C 5.6 12/31/2013   No results found for: "INSULIN" CBC    Component Value Date/Time   WBC 6.0 04/25/2022 0858   WBC 7.5 08/25/2017 1655   RBC 5.21 04/25/2022 0858   RBC 5.18 08/25/2017 1655   HGB 14.7 04/25/2022 0858   HCT 44.6 04/25/2022 0858   PLT 309 04/25/2022 0858   MCV 86 04/25/2022 0858   MCH 28.2 04/25/2022 0858   MCH 28.2 08/25/2017 1655   MCHC 33.0 04/25/2022 0858   MCHC 33.5 08/25/2017 1655   RDW 14.6 04/25/2022 0858   Iron/TIBC/Ferritin/ %Sat    Component Value Date/Time   IRON 56 03/14/2016 1056   TIBC 348 03/14/2016 1056   FERRITIN 76 03/14/2016 1056   IRONPCTSAT 16 03/14/2016 1056   Lipid Panel     Component Value Date/Time   CHOL 221 (H) 05/20/2017 1215   TRIG 178 (H) 05/20/2017 1215   HDL 46 05/20/2017 1215   CHOLHDL 4.8 05/20/2017 1215  VLDL 31 (H) 02/06/2017 1151   LDLCALC 144 (H) 05/20/2017 1215   Hepatic Function Panel     Component Value Date/Time   PROT 6.8 06/14/2021 1000   ALBUMIN 4.7 06/14/2021 1000   AST 21 06/14/2021 1000   ALT 20 06/14/2021 1000   ALKPHOS 50 06/14/2021 1000   BILITOT 0.3 06/14/2021 1000   BILIDIR 0.1 05/20/2017 1215   IBILI 0.5 05/20/2017 1215      Component Value Date/Time   TSH 3.820 05/02/2022 0922     Assessment and Plan:   Hyperlipidemia, unspecified hyperlipidemia type Assessment & Plan: Currently not on any lipid-lowering treatment.  Hope to see improvement in his cholesterol readings with weight reduction.  Will be planning to start a low saturated fat, low trans fat diet  once he officially starts our program.  We discussed adding in more regular exercise.  Lab Results  Component Value Date   CHOL 221 (H) 05/20/2017   HDL 46 05/20/2017   LDLCALC 144 (H) 05/20/2017   TRIG 178 (H) 05/20/2017   CHOLHDL 4.8 05/20/2017      Morbid obesity (HCC)  BMI 50.0-59.9, adult ([place])  Essential hypertension Assessment & Plan: Blood pressure well-controlled on amlodipine 5 mg once daily. Denies chest pain or dyspnea on exertion [place] see improvement in blood pressure with weight reduction   Prediabetes Assessment & Plan: Lab Results  Component Value Date   HGBA1C 5.8 (H) 12/11/2021    Reviewed most recent A1c at 5.8.  Patient is at high risk for type 2 diabetes with a BMI of 53 and a high visceral fat rating.  He is consuming excess carbohydrate and sugar intake with a lack of regular exercise.  Will look for improvement in A1c levels and plan to recheck A1c level at his next visit.         Obesity Treatment / Action Plan:  Patient will work on garnering support from family and friends to begin weight loss journey. Will work on eliminating or reducing the presence of highly palatable, calorie dense foods in the home. Will complete provided nutritional and psychosocial assessment questionnaire before the next appointment. Will be scheduled for indirect calorimetry to determine resting energy expenditure in a fasting state.  This will allow [place] to create a reduced calorie, high-protein meal plan to promote loss of fat mass while preserving muscle mass. Will think about ideas on how to incorporate physical activity into their daily routine. Was counseled on nutritional approaches to weight loss and benefits of complex carbs and high quality protein as part of nutritional weight management. Was counseled on pharmacotherapy and role as an adjunct in weight management.   Obesity Education Performed Today:  He was weighed on the bioimpedance scale and  results were discussed and documented in the synopsis.  We discussed obesity as a disease and the importance of a more detailed evaluation of all the factors contributing to the disease.  We discussed the importance of long term lifestyle changes which include nutrition, exercise and behavioral modifications as well as the importance of customizing this to his specific health and social needs.  We discussed the benefits of reaching a healthier weight to alleviate the symptoms of existing conditions and reduce the risks of the biomechanical, metabolic and psychological effects of obesity.  David Tanner appears to be in the action stage of change and states they are ready to start intensive lifestyle modifications and behavioral modifications.  30 minutes was spent today on this visit including the  above counseling, pre-visit chart review, and post-visit documentation.  Reviewed by clinician on day of visit: allergies, medications, problem list, medical history, surgical history, family history, social history, and previous encounter notes.    Loyal Gambler, DO

## 2022-11-12 NOTE — Assessment & Plan Note (Signed)
Blood pressure well-controlled on amlodipine 5 mg once daily. Denies chest pain or dyspnea on exertion [place] see improvement in blood pressure with weight reduction

## 2022-11-20 ENCOUNTER — Encounter (INDEPENDENT_AMBULATORY_CARE_PROVIDER_SITE_OTHER): Payer: Self-pay | Admitting: Family Medicine

## 2022-11-20 ENCOUNTER — Ambulatory Visit (INDEPENDENT_AMBULATORY_CARE_PROVIDER_SITE_OTHER): Payer: BC Managed Care – PPO | Admitting: Family Medicine

## 2022-11-20 VITALS — BP 139/94 | HR 69 | Temp 98.5°F | Ht 71.0 in | Wt 383.0 lb

## 2022-11-20 DIAGNOSIS — R5383 Other fatigue: Secondary | ICD-10-CM | POA: Insufficient documentation

## 2022-11-20 DIAGNOSIS — G932 Benign intracranial hypertension: Secondary | ICD-10-CM | POA: Insufficient documentation

## 2022-11-20 DIAGNOSIS — Z1331 Encounter for screening for depression: Secondary | ICD-10-CM

## 2022-11-20 DIAGNOSIS — E038 Other specified hypothyroidism: Secondary | ICD-10-CM

## 2022-11-20 DIAGNOSIS — E039 Hypothyroidism, unspecified: Secondary | ICD-10-CM

## 2022-11-20 DIAGNOSIS — Z86718 Personal history of other venous thrombosis and embolism: Secondary | ICD-10-CM

## 2022-11-20 DIAGNOSIS — I10 Essential (primary) hypertension: Secondary | ICD-10-CM

## 2022-11-20 DIAGNOSIS — R0602 Shortness of breath: Secondary | ICD-10-CM

## 2022-11-20 DIAGNOSIS — R7303 Prediabetes: Secondary | ICD-10-CM | POA: Insufficient documentation

## 2022-11-20 DIAGNOSIS — Z6841 Body Mass Index (BMI) 40.0 and over, adult: Secondary | ICD-10-CM

## 2022-11-20 MED ORDER — LEVOTHYROXINE SODIUM 75 MCG PO TABS: 75.0000 ug | ORAL_TABLET | Freq: Every day | ORAL | 0 refills | Status: DC

## 2022-11-20 MED ORDER — AMLODIPINE BESYLATE 5 MG PO TABS: 5.0000 mg | ORAL_TABLET | Freq: Every day | ORAL | 0 refills | Status: DC

## 2022-11-20 MED ORDER — TOPIRAMATE 100 MG PO TABS: 100.0000 mg | ORAL_TABLET | Freq: Two times a day (BID) | ORAL | 0 refills | Status: DC

## 2022-11-21 LAB — COMPREHENSIVE METABOLIC PANEL
ALT: 31 IU/L (ref 0–44)
AST: 21 IU/L (ref 0–40)
Albumin/Globulin Ratio: 2 (ref 1.2–2.2)
Albumin: 4.4 g/dL (ref 4.1–5.1)
Alkaline Phosphatase: 49 IU/L (ref 44–121)
BUN/Creatinine Ratio: 8 — ABNORMAL LOW (ref 9–20)
BUN: 9 mg/dL (ref 6–20)
Bilirubin Total: 0.4 mg/dL (ref 0.0–1.2)
CO2: 23 mmol/L (ref 20–29)
Calcium: 9.6 mg/dL (ref 8.7–10.2)
Chloride: 104 mmol/L (ref 96–106)
Creatinine, Ser: 1.19 mg/dL (ref 0.76–1.27)
Globulin, Total: 2.2 g/dL (ref 1.5–4.5)
Glucose: 76 mg/dL (ref 70–99)
Potassium: 4.2 mmol/L (ref 3.5–5.2)
Sodium: 144 mmol/L (ref 134–144)
Total Protein: 6.6 g/dL (ref 6.0–8.5)
eGFR: 82 mL/min/{1.73_m2} (ref >59)

## 2022-11-21 LAB — CBC WITH DIFFERENTIAL/PLATELET
Basophils Absolute: 0.1 10*3/uL (ref 0.0–0.2)
Basos: 1 %
EOS (ABSOLUTE): 0.3 10*3/uL (ref 0.0–0.4)
Eos: 4 %
Hematocrit: 44.2 % (ref 37.5–51.0)
Hemoglobin: 14.7 g/dL (ref 13.0–17.7)
Immature Grans (Abs): 0 10*3/uL (ref 0.0–0.1)
Immature Granulocytes: 0 %
Lymphocytes Absolute: 3.3 10*3/uL — ABNORMAL HIGH (ref 0.7–3.1)
Lymphs: 46 %
MCH: 28.7 pg (ref 26.6–33.0)
MCHC: 33.3 g/dL (ref 31.5–35.7)
MCV: 86 fL (ref 79–97)
Monocytes Absolute: 0.6 10*3/uL (ref 0.1–0.9)
Monocytes: 9 %
Neutrophils Absolute: 2.9 10*3/uL (ref 1.4–7.0)
Neutrophils: 40 %
Platelets: 307 10*3/uL (ref 150–450)
RBC: 5.13 x10E6/uL (ref 4.14–5.80)
RDW: 13.7 % (ref 11.6–15.4)
WBC: 7.1 10*3/uL (ref 3.4–10.8)

## 2022-11-21 LAB — LIPID PANEL
Chol/HDL Ratio: 5.2 ratio — ABNORMAL HIGH (ref 0.0–5.0)
Cholesterol, Total: 241 mg/dL — ABNORMAL HIGH (ref 100–199)
HDL: 46 mg/dL (ref >39)
LDL Chol Calc (NIH): 150 mg/dL — ABNORMAL HIGH (ref 0–99)
Triglycerides: 247 mg/dL — ABNORMAL HIGH (ref 0–149)
VLDL Cholesterol Cal: 45 mg/dL — ABNORMAL HIGH (ref 5–40)

## 2022-11-21 LAB — HEMOGLOBIN A1C
Est. average glucose Bld gHb Est-mCnc: 123 mg/dL
Hgb A1c MFr Bld: 5.9 % — ABNORMAL HIGH (ref 4.8–5.6)

## 2022-11-21 LAB — VITAMIN D 25 HYDROXY (VIT D DEFICIENCY, FRACTURES): Vit D, 25-Hydroxy: 7.2 ng/mL — ABNORMAL LOW (ref 30.0–100.0)

## 2022-11-21 LAB — T4, FREE: Free T4: 1.03 ng/dL (ref 0.82–1.77)

## 2022-11-21 LAB — VITAMIN B12: Vitamin B-12: 269 pg/mL (ref 232–1245)

## 2022-11-21 LAB — INSULIN, RANDOM: INSULIN: 27.5 u[IU]/mL — ABNORMAL HIGH (ref 2.6–24.9)

## 2022-11-21 LAB — TSH: TSH: 5.5 u[IU]/mL — ABNORMAL HIGH (ref 0.450–4.500)

## 2022-11-28 ENCOUNTER — Ambulatory Visit: Payer: Managed Care, Other (non HMO) | Admitting: Neurology

## 2022-12-04 ENCOUNTER — Ambulatory Visit (INDEPENDENT_AMBULATORY_CARE_PROVIDER_SITE_OTHER): Payer: BC Managed Care – PPO | Admitting: Family Medicine

## 2022-12-04 ENCOUNTER — Encounter (INDEPENDENT_AMBULATORY_CARE_PROVIDER_SITE_OTHER): Payer: Self-pay | Admitting: Family Medicine

## 2022-12-04 VITALS — BP 139/84 | HR 78 | Temp 98.2°F | Ht 71.0 in | Wt 374.0 lb

## 2022-12-04 DIAGNOSIS — R7303 Prediabetes: Secondary | ICD-10-CM | POA: Diagnosis not present

## 2022-12-04 DIAGNOSIS — E538 Deficiency of other specified B group vitamins: Secondary | ICD-10-CM

## 2022-12-04 DIAGNOSIS — Z6841 Body Mass Index (BMI) 40.0 and over, adult: Secondary | ICD-10-CM

## 2022-12-04 DIAGNOSIS — E039 Hypothyroidism, unspecified: Secondary | ICD-10-CM | POA: Diagnosis not present

## 2022-12-04 DIAGNOSIS — E88819 Insulin resistance, unspecified: Secondary | ICD-10-CM

## 2022-12-04 DIAGNOSIS — E559 Vitamin D deficiency, unspecified: Secondary | ICD-10-CM

## 2022-12-04 MED ORDER — CYANOCOBALAMIN 1000 MCG/ML IJ SOLN: 1000.0000 ug | INTRAMUSCULAR | 0 refills | Status: DC

## 2022-12-04 MED ORDER — METFORMIN HCL ER 500 MG PO TB24: 500.0000 mg | ORAL_TABLET | Freq: Every day | ORAL | 0 refills | Status: DC

## 2022-12-04 MED ORDER — VITAMIN D (ERGOCALCIFEROL) 1.25 MG (50000 UNIT) PO CAPS: 50000.0000 [IU] | ORAL_CAPSULE | ORAL | 0 refills | Status: DC

## 2022-12-04 NOTE — Progress Notes (Signed)
Office: [phone]  /  Fax: [phone]  WEIGHT SUMMARY AND BIOMETRICS  Vitals Temp: 98.2 F (36.8 C) BP: 139/84 Pulse Rate: 78 SpO2: 97 %   Anthropometric Measurements Height: '5\' 11"'$  (1.803 m) Weight: (!) 374 lb (169.6 kg) BMI (Calculated): 52.19 Weight at Last Visit: 383lb Weight Lost Since Last Visit: 9lb Starting Weight: 383l Total Weight Loss (lbs): 9 lb (4.082 kg) Waist Measurement : 61 inches   Body Composition  Body Fat %: 43 % Fat Mass (lbs): 161.2 lbs Muscle Mass (lbs): 203.2 lbs Total Body Water (lbs): 163 lbs Visceral Fat Rating : 29   Other Clinical Data RMR: [id number] Fasting: no Labs: no Today's Visit #: 2 Starting Date: 11/20/22    HPI  Chief Complaint: OBESITY  David Tanner is here to discuss his progress with his obesity treatment plan. He is on the category 4 meal plan and states he is following his eating plan approximately 90 % of the time. He states he is exercising 30-60 minutes 3 times per week.   Interval History:  Since last office visit he is down 9 lb He is getting in all the food on his plan Feeling adequately full.  Using some home exercise 3 x a weeks Plans to walk the dog and yard work this Spring Denies cravings He is drinking zero sugar juice and GZERO   Pharmacotherapy: None  PHYSICAL EXAM:  Blood pressure 139/84, pulse 78, temperature 98.2 F (36.8 C), height '5\' 11"'$  (1.803 m), weight (!) 374 lb (169.6 kg), SpO2 97 %. Body mass index is 52.16 kg/m.  General: He is overweight, cooperative, alert, well developed, and in no acute distress. PSYCH: Has normal mood, affect and thought process.   Lungs: Normal breathing effort, no conversational dyspnea.  DIAGNOSTIC DATA REVIEWED:  BMET    Component Value Date/Time   NA 144 11/20/2022 1153   K 4.2 11/20/2022 1153   CL 104 11/20/2022 1153   CO2 23 11/20/2022 1153   GLUCOSE 76 11/20/2022 1153   GLUCOSE 87 03/09/2021 1305   BUN 9 11/20/2022 1153   CREATININE 1.19  11/20/2022 1153   CREATININE 1.32 08/25/2017 1655   CALCIUM 9.6 11/20/2022 1153   GFRNONAA 72 08/25/2017 1655   GFRAA 84 08/25/2017 1655   Lab Results  Component Value Date   HGBA1C 5.9 (H) 11/20/2022   HGBA1C 5.6 12/31/2013   Lab Results  Component Value Date   INSULIN 27.5 (H) 11/20/2022   Lab Results  Component Value Date   TSH 5.500 (H) 11/20/2022   CBC    Component Value Date/Time   WBC 7.1 11/20/2022 1153   WBC 7.5 08/25/2017 1655   RBC 5.13 11/20/2022 1153   RBC 5.18 08/25/2017 1655   HGB 14.7 11/20/2022 1153   HCT 44.2 11/20/2022 1153   PLT 307 11/20/2022 1153   MCV 86 11/20/2022 1153   MCH 28.7 11/20/2022 1153   MCH 28.2 08/25/2017 1655   MCHC 33.3 11/20/2022 1153   MCHC 33.5 08/25/2017 1655   RDW 13.7 11/20/2022 1153   Iron Studies    Component Value Date/Time   IRON 56 03/14/2016 1056   TIBC 348 03/14/2016 1056   FERRITIN 76 03/14/2016 1056   IRONPCTSAT 16 03/14/2016 1056   Lipid Panel     Component Value Date/Time   CHOL 241 (H) 11/20/2022 1153   TRIG 247 (H) 11/20/2022 1153   HDL 46 11/20/2022 1153   CHOLHDL 5.2 (H) 11/20/2022 1153   CHOLHDL 4.8 05/20/2017 1215  VLDL 31 (H) 02/06/2017 1151   LDLCALC 150 (H) 11/20/2022 1153   LDLCALC 144 (H) 05/20/2017 1215   Hepatic Function Panel     Component Value Date/Time   PROT 6.6 11/20/2022 1153   ALBUMIN 4.4 11/20/2022 1153   AST 21 11/20/2022 1153   ALT 31 11/20/2022 1153   ALKPHOS 49 11/20/2022 1153   BILITOT 0.4 11/20/2022 1153   BILIDIR 0.1 05/20/2017 1215   IBILI 0.5 05/20/2017 1215      Component Value Date/Time   TSH 5.500 (H) 11/20/2022 1153   Nutritional Lab Results  Component Value Date   VD25OH 7.2 (L) 11/20/2022   VD25OH 18 (L) 05/20/2017   VD25OH 10 (L) 02/06/2017     ASSESSMENT AND PLAN  TREATMENT PLAN FOR OBESITY:  Recommended Dietary Goals  Jorma is currently in the action stage of change. As such, his goal is to continue weight management plan. He has  agreed to the Category 4 Plan.  Behavioral Intervention  We discussed the following Behavioral Modification Strategies today: increasing lean protein intake, increasing vegetables, increasing water intake, work on meal planning and easy cooking plans, decreasing eating out, consumption of processed foods, and making healthy choices when eating convenient foods, and reading food labels .  Additional resources provided today: NA  Recommended Physical Activity Goals  Ingemar has been advised to work up to 150 minutes of moderate intensity aerobic activity a week and strengthening exercises 2-3 times per week for cardiovascular health, weight loss maintenance and preservation of muscle mass.   He has agreed to continue physical activity as is.    Pharmacotherapy We discussed various medication options to help Klayten with his weight loss efforts and we both agreed to metformin.  ASSOCIATED CONDITIONS ADDRESSED TODAY  Insulin resistance Assessment & Plan: Reviewed labs with patient from last visit including a fasting insulin elevated at 27.5.  He has a reducing his intake of added sugar and reading labels on food and drink for sugar avoiding products with over 8 g of sugar per serving.  He has plans to increase physical activity over the spring months.  He agrees to starting metformin XR 500 mg once daily with dinner.  Reviewed potential adverse side effects.  Plan to recheck labs in the next 2 months.  Orders: -     metFORMIN HCl ER; Take 1 tablet (500 mg total) by mouth daily with supper.  Dispense: 90 tablet; Refill: 0  Vitamin B12 deficiency Assessment & Plan: Reviewed labs with patient.  His B12 level was low normal at 269.  He had been on B12 injections once weekly through his PCP but has been off for the past 6 weeks.  Energy level is starting to reduce.  He reports not seeing a rise in B12 levels on oral B12 in the past.  He denies paresthesias  Will resume injectable B12 1000  mcg q. 14 days.  Recheck level in 2 to 3 months.  Orders: -     Cyanocobalamin; Inject 1 mL (1,000 mcg total) into the muscle every 14 (fourteen) days.  Dispense: 10 mL; Refill: 0  Morbid obesity (HCC) Assessment & Plan: Reviewed bioimpedance results from 2 weeks ago.  He has gained 0.4 pounds of muscle mass and reduced body fat by 9.2 pounds.  He is doing well on his prescribed meal plan without hunger or cravings.  His motivation is high to continue working on healthy lifestyle changes.  Continue prescribed meal plan   BMI 50.0-59.9, adult ([place])  Pre-diabetes Assessment & Plan: Lab Results  Component Value Date   HGBA1C 5.9 (H) 11/20/2022   Reviewed labs with patient.  A1c is in the prediabetic range and has been stable for over a year at 5.9.  He has actively been working on his prescribed dietary plan, reducing intake of added sugar.  He is high risk for developing type 2 diabetes with a BMI over 50.  He has added more regular exercise and will continue to aim for at least 150 minutes of exercise moderate intensity weekly.   Vitamin D deficiency Assessment & Plan: Last vitamin D Lab Results  Component Value Date   VD25OH 7.2 (L) 11/20/2022   Reviewed labs with patient from last visit.  He has findings of vitamin D deficiency at 7.2.  We discussed health consequences of low vitamin D levels including fatigue, bone loss, impaired immune function and metabolic disease.  Begin prescription vitamin D 50,000 IU twice weekly.  Recheck level in 2 to 3 months.  Orders: -     Vitamin D (Ergocalciferol); Take 1 capsule (50,000 Units total) by mouth 2 (two) times a week.  Dispense: 10 capsule; Refill: 0  Hypothyroidism, unspecified type Assessment & Plan: Reviewed labs from last visit.  His TSH was slightly elevated at 5.5 but he had been off levothyroxine for a few months before his visit.  He is back on levothyroxine 75 mcg once daily and taking as directed.  Energy level is starting  to improve.  Recheck TSH in 2 months with next labs.       Return in about 4 weeks (around 01/01/2023).Marland Kitchen He was informed of the importance of frequent follow up visits to maximize his success with intensive lifestyle modifications for his multiple health conditions.   ATTESTASTION STATEMENTS:  Reviewed by clinician on day of visit: allergies, medications, problem list, medical history, surgical history, family history, social history, and previous encounter notes pertinent to obesity diagnosis.   I have personally spent 30 minutes total time today in preparation, patient care, nutritional counseling and documentation for this visit, including the following: review of clinical lab tests; review of medical tests/procedures/services.      Dell Ponto, DO DABFM, DABOM [facility] [address] [place], [place] [address] [phone]

## 2022-12-04 NOTE — Assessment & Plan Note (Addendum)
Reviewed labs from last visit.  His TSH was slightly elevated at 5.5 but he had been off levothyroxine for a few months before his visit.  He is back on levothyroxine 75 mcg once daily and taking as directed.  Energy level is starting to improve.  Recheck TSH in 2 months with next labs.

## 2022-12-04 NOTE — Assessment & Plan Note (Signed)
Last vitamin D Lab Results  Component Value Date   VD25OH 7.2 (L) 11/20/2022   Reviewed labs with patient from last visit.  He has findings of vitamin D deficiency at 7.2.  We discussed health consequences of low vitamin D levels including fatigue, bone loss, impaired immune function and metabolic disease.  Begin prescription vitamin D 50,000 IU twice weekly.  Recheck level in 2 to 3 months.

## 2022-12-04 NOTE — Assessment & Plan Note (Signed)
Reviewed bioimpedance results from 2 weeks ago.  He has gained 0.4 pounds of muscle mass and reduced body fat by 9.2 pounds.  He is doing well on his prescribed meal plan without hunger or cravings.  His motivation is high to continue working on healthy lifestyle changes.  Continue prescribed meal plan

## 2022-12-04 NOTE — Progress Notes (Signed)
Chief Complaint:   OBESITY David Tanner (MR# [id number]) is a 37 y.o. male who presents for evaluation and treatment of obesity and related comorbidities. Current BMI is Body mass index is 53.42 kg/m. David Tanner has been struggling with his weight for many years and has been unsuccessful in either losing weight, maintaining weight loss, or reaching his healthy weight goal.  Patient lives with his wife and 3 children.  He would like to lose 140 LBS, he craves burgers, cookies and chips.  Patient skips breakfast, drinks regular soda, sweet coffee drinks, juice, sweet tea.  He delivers windows and doors.  Limited walking, but some lifting.  David Tanner is currently in the action stage of change and ready to dedicate time achieving and maintaining a healthier weight. David Tanner is interested in becoming our patient and working on intensive lifestyle modifications including (but not limited to) diet and exercise for weight loss.  David Tanner's habits were reviewed today and are as follows: His family eats meals together, he thinks his family will eat healthier with him, he struggles with family and or coworkers weight loss sabotage, his desired weight loss is 138 lbs, he started gaining weight 10 years ago, his heaviest weight ever was 383 pounds, he has significant food cravings issues, he snacks frequently in the evenings, he skips meals frequently, he is frequently drinking liquids with calories, he frequently makes poor food choices, and he frequently eats larger portions than normal.  Depression Screen David Tanner's Food and Mood (modified PHQ-9) score was 0.  Subjective:   1. Other fatigue David Tanner denies daytime somnolence and denies waking up still tired. Patient has a history of symptoms of n/a. David Tanner generally gets 6 or 7 hours of sleep per night, and states that he has generally restful sleep. Snoring is present. Apneic episodes are not present. Epworth Sleepiness Score is 8. EKG updated and  reviewed today.   2. SOBOE (shortness of breath on exertion) David Tanner notes increasing shortness of breath with exercising and seems to be worsening over time with weight gain. He notes getting out of breath sooner with activity than he used to. This has not gotten worse recently. David Tanner denies shortness of breath at rest or orthopnea.  3. Essential hypertension Blood pressure elevated today.  Patient takes amlodipine 5 mg daily, but ran out.  Patient denies chest pain or headache.  4. Pre-diabetes A1c in March 2023 was 5.8, unchanged x 7 years.  Patient has a positive family history of type 2 diabetes.  Patient has never taken metformin.  5. History of DVT (deep vein thrombosis) History of DVT in the lower left extremity.  Patient denies left leg pain or edema.  6. Benign intracranial hypertension Notes from neurology reviewed from 05/23/2022.  Patient is taking Topamax 100 mg twice daily and has not seen any weight loss.  7. Other specified hypothyroidism Patient is on levothyroxine 75 mcg daily, he ran out.  Assessment/Plan:   1. Other fatigue David Tanner does feel that his weight is causing his energy to be lower than it should be. Fatigue may be related to obesity, depression or many other causes. Labs will be ordered, and in the meanwhile, David Tanner will focus on self care including making healthy food choices, increasing physical activity and focusing on stress reduction.  - EKG 12-Lead - VITAMIN D 25 Hydroxy (Vit-D Deficiency, Fractures) - TSH - T4, free - Lipid panel - Insulin, random - Hemoglobin A1c - Comprehensive metabolic panel - Vitamin [id number] - CBC with Differential/Platelet  2. SOBOE (shortness of breath on exertion) David Tanner does feel that he gets out of breath more easily that he used to when he exercises. David Tanner's shortness of breath appears to be obesity related and exercise induced. He has agreed to work on weight loss and gradually increase exercise to treat his  exercise induced shortness of breath. Will continue to monitor closely.  3. Essential hypertension Check labs today.  - Lipid panel - Comprehensive metabolic panel  Refill- amLODipine (NORVASC) 5 MG tablet; Take 1 tablet (5 mg total) by mouth daily.  Dispense: 90 tablet; Refill: 0  4. Pre-diabetes Check labs today.  Reduce sugar intake.  - Insulin, random - Hemoglobin A1c  5. History of DVT (deep vein thrombosis) Begin active plan for weight loss.  Morbid obesity increases risk for VTE.  6. Benign intracranial hypertension Continue Topamax 100 mg twice daily.  Begin active plan for weight loss.  - Lipid panel - Comprehensive metabolic panel  Refill- topiramate (TOPAMAX) 100 MG tablet; Take 1 tablet (100 mg total) by mouth 2 (two) times daily.  Dispense: 180 tablet; Refill: 0  7. Other specified hypothyroidism Check labs today.  - TSH - T4, free  Refill- levothyroxine (SYNTHROID) 75 MCG tablet; Take 1 tablet (75 mcg total) by mouth daily.  Dispense: 90 tablet; Refill: 0  8. Depression screening David Tanner had a negative depression screening.   9. Morbid obesity ([place])  10. Obesity,current BMI 53.5 Patient given 200 calorie snack list.   David Tanner is currently in the action stage of change and his goal is to continue with weight loss efforts. I recommend David Tanner begin the structured treatment plan as follows:  He has agreed to the Category 4 Plan with 200 snack calories.   Exercise goals: All adults should avoid inactivity. Some physical activity is better than none, and adults who participate in any amount of physical activity gain some health benefits.   Behavioral modification strategies: increasing lean protein intake, increasing vegetables, increasing water intake, decreasing liquid calories, decreasing eating out, no skipping meals, meal planning and cooking strategies, keeping healthy foods in the home, better snacking choices, avoiding temptations, and planning for  success.  He was informed of the importance of frequent follow-up visits to maximize his success with intensive lifestyle modifications for his multiple health conditions. He was informed we would discuss his lab results at his next visit unless there is a critical issue that needs to be addressed sooner. David Tanner agreed to keep his next visit at the agreed upon time to discuss these results.  Objective:   Blood pressure (!) 139/94, pulse 69, temperature 98.5 F (36.9 C), height '5\' 11"'$  (1.803 m), weight (!) 383 lb (173.7 kg), SpO2 97 %. Body mass index is 53.42 kg/m.  EKG: Normal sinus rhythm, rate 68 bpm.  Indirect Calorimeter completed today shows a VO2 of 425 and a REE of 2938.  His calculated basal metabolic rate is [id number] thus his basal metabolic rate is worse than expected.  General: Cooperative, alert, well developed, in no acute distress. HEENT: Conjunctivae and lids unremarkable. Cardiovascular: Regular rhythm.  Lungs: Normal work of breathing. Neurologic: No focal deficits.   Lab Results  Component Value Date   CREATININE 1.19 11/20/2022   BUN 9 11/20/2022   NA 144 11/20/2022   K 4.2 11/20/2022   CL 104 11/20/2022   CO2 23 11/20/2022   Lab Results  Component Value Date   ALT 31 11/20/2022   AST 21 11/20/2022   ALKPHOS 49 11/20/2022  BILITOT 0.4 11/20/2022   Lab Results  Component Value Date   HGBA1C 5.9 (H) 11/20/2022   HGBA1C 5.8 (H) 12/11/2021   HGBA1C 5.9 (H) 06/14/2021   HGBA1C 5.5 05/20/2017   HGBA1C 5.6 02/06/2017   Lab Results  Component Value Date   INSULIN 27.5 (H) 11/20/2022   Lab Results  Component Value Date   TSH 5.500 (H) 11/20/2022   Lab Results  Component Value Date   CHOL 241 (H) 11/20/2022   HDL 46 11/20/2022   LDLCALC 150 (H) 11/20/2022   TRIG 247 (H) 11/20/2022   CHOLHDL 5.2 (H) 11/20/2022   Lab Results  Component Value Date   WBC 7.1 11/20/2022   HGB 14.7 11/20/2022   HCT 44.2 11/20/2022   MCV 86 11/20/2022   PLT 307  11/20/2022   Lab Results  Component Value Date   IRON 56 03/14/2016   TIBC 348 03/14/2016   FERRITIN 76 03/14/2016   Attestation Statements:   Reviewed by clinician on day of visit: allergies, medications, problem list, medical history, surgical history, family history, social history, and previous encounter notes.  Time spent on visit including pre-visit chart review and post-visit charting and care was 40 minutes.   I, Davy Pique, am acting as Location manager for Loyal Gambler, DO.  I have reviewed the above documentation for accuracy and completeness, and I agree with the above. Dell Ponto, DO

## 2022-12-04 NOTE — Assessment & Plan Note (Signed)
Reviewed labs with patient from last visit including a fasting insulin elevated at 27.5.  He has a reducing his intake of added sugar and reading labels on food and drink for sugar avoiding products with over 8 g of sugar per serving.  He has plans to increase physical activity over the spring months.  He agrees to starting metformin XR 500 mg once daily with dinner.  Reviewed potential adverse side effects.  Plan to recheck labs in the next 2 months.

## 2022-12-04 NOTE — Assessment & Plan Note (Signed)
Lab Results  Component Value Date   HGBA1C 5.9 (H) 11/20/2022   Reviewed labs with patient.  A1c is in the prediabetic range and has been stable for over a year at 5.9.  He has actively been working on his prescribed dietary plan, reducing intake of added sugar.  He is high risk for developing type 2 diabetes with a BMI over 50.  He has added more regular exercise and will continue to aim for at least 150 minutes of exercise moderate intensity weekly.

## 2022-12-04 NOTE — Assessment & Plan Note (Signed)
Reviewed labs with patient.  His B12 level was low normal at 269.  He had been on B12 injections once weekly through his PCP but has been off for the past 6 weeks.  Energy level is starting to reduce.  He reports not seeing a rise in B12 levels on oral B12 in the past.  He denies paresthesias  Will resume injectable B12 1000 mcg q. 14 days.  Recheck level in 2 to 3 months.

## 2022-12-20 ENCOUNTER — Other Ambulatory Visit (INDEPENDENT_AMBULATORY_CARE_PROVIDER_SITE_OTHER): Payer: Self-pay | Admitting: Family Medicine

## 2022-12-20 DIAGNOSIS — E559 Vitamin D deficiency, unspecified: Secondary | ICD-10-CM

## 2023-01-02 ENCOUNTER — Encounter (INDEPENDENT_AMBULATORY_CARE_PROVIDER_SITE_OTHER): Payer: Self-pay | Admitting: Family Medicine

## 2023-01-02 ENCOUNTER — Ambulatory Visit (INDEPENDENT_AMBULATORY_CARE_PROVIDER_SITE_OTHER): Payer: BC Managed Care – PPO | Admitting: Family Medicine

## 2023-01-02 VITALS — BP 139/79 | HR 97 | Temp 98.5°F | Ht 71.0 in | Wt 369.0 lb

## 2023-01-02 DIAGNOSIS — E559 Vitamin D deficiency, unspecified: Secondary | ICD-10-CM | POA: Diagnosis not present

## 2023-01-02 DIAGNOSIS — Z6841 Body Mass Index (BMI) 40.0 and over, adult: Secondary | ICD-10-CM

## 2023-01-02 DIAGNOSIS — E038 Other specified hypothyroidism: Secondary | ICD-10-CM | POA: Diagnosis not present

## 2023-01-02 DIAGNOSIS — R7303 Prediabetes: Secondary | ICD-10-CM | POA: Diagnosis not present

## 2023-01-02 DIAGNOSIS — G932 Benign intracranial hypertension: Secondary | ICD-10-CM

## 2023-01-02 DIAGNOSIS — E538 Deficiency of other specified B group vitamins: Secondary | ICD-10-CM | POA: Diagnosis not present

## 2023-01-02 DIAGNOSIS — E88819 Insulin resistance, unspecified: Secondary | ICD-10-CM

## 2023-01-02 DIAGNOSIS — I10 Essential (primary) hypertension: Secondary | ICD-10-CM

## 2023-01-02 MED ORDER — TOPIRAMATE 100 MG PO TABS: 100.0000 mg | ORAL_TABLET | Freq: Two times a day (BID) | ORAL | 0 refills | Status: DC

## 2023-01-02 MED ORDER — AMLODIPINE BESYLATE 5 MG PO TABS: 5.0000 mg | ORAL_TABLET | Freq: Every day | ORAL | 0 refills | Status: DC

## 2023-01-02 MED ORDER — VITAMIN D (ERGOCALCIFEROL) 1.25 MG (50000 UNIT) PO CAPS: 50000.0000 [IU] | ORAL_CAPSULE | ORAL | 0 refills | Status: DC

## 2023-01-02 MED ORDER — METFORMIN HCL ER 500 MG PO TB24: 500.0000 mg | ORAL_TABLET | Freq: Every day | ORAL | 0 refills | Status: DC

## 2023-01-02 MED ORDER — CYANOCOBALAMIN 1000 MCG/ML IJ SOLN: 1000.0000 ug | INTRAMUSCULAR | 0 refills | Status: DC

## 2023-01-02 MED ORDER — LEVOTHYROXINE SODIUM 75 MCG PO TABS
75.0000 ug | ORAL_TABLET | Freq: Every day | ORAL | 0 refills | Status: DC
Start: 2023-01-02 — End: 2023-02-06

## 2023-01-02 NOTE — Assessment & Plan Note (Signed)
BP has improved on Amlodopine 5 mg once daily.  He is tolerating this well without adverse side effects. He has reduced his intake of high sodium foods and is actively working on weight loss.  Plan: Continue amlodipine 5 mg daily.  Follow-up with PCP for further management.  Continue active plan for weight loss.

## 2023-01-02 NOTE — Assessment & Plan Note (Signed)
He is doing well on B12 injections 1000 mcg q. 14 days.  He has a history of poor oral absorption of B12.  He is having occasional paresthesias and slow improvement in energy levels.  Continue B12 injections 1000 mg q. 14 days.  Recheck B12 level next visit.

## 2023-01-02 NOTE — Assessment & Plan Note (Signed)
He is doing well on metformin XR 500 mg once daily with food.  He has noticed some looser stools.  He is doing well with dietary changes, reducing intake of added sugar and refined carbohydrates.  Plan continue metformin 500 mg XR once daily with food.  Recheck chemistry panel and A1c next visit.  Continue active plan for weight reduction and prescribed meal plan.

## 2023-01-02 NOTE — Assessment & Plan Note (Signed)
He is doing well on topiramate 100 mg twice daily for benign intracranial hypertension, headache prevention and food impulse control.  He has occasional paresthesias but no other adverse side effects.  This medication may be taken over by his PCP at his next follow-up visit.  Continue topiramate 100 mg twice daily.  Recheck chemistry panel next visit.

## 2023-01-02 NOTE — Progress Notes (Signed)
Office: [phone]  /  Fax: [place]  Starting Date: 11/20/22  Starting Weight: 380lb   Weight Lost Since Last Visit: 5lb   Vitals Temp: 98.5 F (36.9 C) BP: 139/79 Pulse Rate: 97 SpO2: 100 %   Body Composition  Body Fat %: 43.4 % Fat Mass (lbs): 160.2 lbs Muscle Mass (lbs): 198.6 lbs Total Body Water (lbs): 163.8 lbs Visceral Fat Rating : 29    HPI  Chief Complaint: OBESITY  David Tanner is here to discuss his progress with his obesity treatment plan. He is on the following a lower carbohydrate, vegetable and lean protein rich diet plan and states he is following his eating plan approximately 75 % of the time. He states he is not exercising, that his work is exercise.  Interval History:  Since last office visit he is down 5 lb He has a net weight loss of 14 lb in 5 weeks He is getting his meals in.  Wife is also working on weight loss He walks a lot at work and lifting at work He feels well on his medication and energy level is starting to improve He is sleeping well at night He has increased his water intake  Pharmacotherapy: none  PHYSICAL EXAM:  Blood pressure 139/79, pulse 97, temperature 98.5 F (36.9 C), height 5\' 11"  (1.803 m), weight (!) 369 lb (167.4 kg), SpO2 100 %. Body mass index is 51.47 kg/m.  General: He is overweight, cooperative, alert, well developed, and in no acute distress. PSYCH: Has normal mood, affect and thought process.   Lungs: Normal breathing effort, no conversational dyspnea.   ASSESSMENT AND PLAN  TREATMENT PLAN FOR OBESITY:  Recommended Dietary Goals  David Tanner is currently in the action stage of change. As such, his goal is to continue weight management plan. He has agreed to the Category 4 Plan.  Behavioral Intervention  We discussed the following Behavioral Modification Strategies today: increasing lean protein intake, increasing vegetables, increasing fiber rich foods, increasing  water intake, decreasing eating out or consumption of processed foods, and making healthy choices when eating convenient foods, and planning for success.  Additional resources provided today: NA  Recommended Physical Activity Goals  David Tanner has been advised to work up to 150 minutes of moderate intensity aerobic activity a week and strengthening exercises 2-3 times per week for cardiovascular health, weight loss maintenance and preservation of muscle mass.   He has agreed to [employer] strengthening exercises with a goal of 2-3 sessions a week   Pharmacotherapy changes for the treatment of obesity:   ASSOCIATED CONDITIONS ADDRESSED TODAY  Other specified hypothyroidism Assessment & Plan: He is back on levothyroxine 75 mcg daily.  His energy level is slowly improving.  He denies issues with constipation, heat intolerance or heart palpitations.  Plan: Continue levothyroxine 75 mcg daily.  Recheck TSH next visit.  Orders: -     Levothyroxine Sodium; Take 1 tablet (75 mcg total) by mouth daily.  Dispense: 90 tablet; Refill: 0  Vitamin D deficiency Assessment & Plan: Last vitamin D Lab Results  Component Value Date   VD25OH 7.2 (L) 11/20/2022   He is doing well on prescription vitamin D 50,000 IU twice weekly.  He had profound vitamin D deficiency on February labs.  His energy level has been slow to improve.  Continue prescription vitamin D 50,000 IU twice weekly.  Recheck vitamin D level at next visit.  Orders: -     Vitamin D (Ergocalciferol); Take 1 capsule (  50,000 Units total) by mouth 2 (two) times a week.  Dispense: 10 capsule; Refill: 0  Insulin resistance Assessment & Plan: He is doing well on metformin XR 500 mg once daily with food.  He has noticed some looser stools.  He is doing well with dietary changes, reducing intake of added sugar and refined carbohydrates.  Plan continue metformin 500 mg XR once daily with food.  Recheck chemistry panel and A1c next visit.  Continue  active plan for weight reduction and prescribed meal plan.  Orders: -     metFORMIN HCl ER; Take 1 tablet (500 mg total) by mouth daily with supper.  Dispense: 90 tablet; Refill: 0  Vitamin B12 deficiency Assessment & Plan: He is doing well on B12 injections 1000 mcg q. 14 days.  He has a history of poor oral absorption of B12.  He is having occasional paresthesias and slow improvement in energy levels.  Continue B12 injections 1000 mg q. 14 days.  Recheck B12 level next visit.  Orders: -     Cyanocobalamin; Inject 1 mL (1,000 mcg total) into the muscle every 14 (fourteen) days.  Dispense: 10 mL; Refill: 0  Benign intracranial hypertension Assessment & Plan: He is doing well on topiramate 100 mg twice daily for benign intracranial hypertension, headache prevention and food impulse control.  He has occasional paresthesias but no other adverse side effects.  This medication may be taken over by his PCP at his next follow-up visit.  Continue topiramate 100 mg twice daily.  Recheck chemistry panel next visit.  Orders: -     Topiramate; Take 1 tablet (100 mg total) by mouth 2 (two) times daily.  Dispense: 180 tablet; Refill: 0  Essential hypertension Assessment & Plan: BP has improved on Amlodopine 5 mg once daily.  He is tolerating this well without adverse side effects. He has reduced his intake of high sodium foods and is actively working on weight loss.  Plan: Continue amlodipine 5 mg daily.  Follow-up with PCP for further management.  Continue active plan for weight loss.  Orders: -     amLODIPine Besylate; Take 1 tablet (5 mg total) by mouth daily.  Dispense: 90 tablet; Refill: 0  Morbid obesity  BMI 50.0-59.9, adult  Pre-diabetes Assessment & Plan: Lab Results  Component Value Date   HGBA1C 5.9 (H) 11/20/2022   He is actively working on dietary change, physical activity and weight reduction.  He has a net weight loss of 14 pounds in the past 6 weeks of medically  supervised weight management and is tolerating metformin XR 500 mg once daily with food well.  Continue metformin 500 mg XR once daily with food, prescribed meal plan, regular walking and plan to recheck A1c next visit.       He was informed of the importance of frequent follow up visits to maximize his success with intensive lifestyle modifications for his multiple health conditions.   ATTESTASTION STATEMENTS:  Reviewed by clinician on day of visit: allergies, medications, problem list, medical history, surgical history, family history, social history, and previous encounter notes pertinent to obesity diagnosis.   I have personally spent 30 minutes total time today in preparation, patient care, nutritional counseling and documentation for this visit, including the following: review of clinical lab tests; review of medical tests/procedures/services.      Dell Ponto, DO DABFM, DABOM [facility] [address] [place], [place] [address] [phone]

## 2023-01-02 NOTE — Assessment & Plan Note (Signed)
He is back on levothyroxine 75 mcg daily.  His energy level is slowly improving.  He denies issues with constipation, heat intolerance or heart palpitations.  Plan: Continue levothyroxine 75 mcg daily.  Recheck TSH next visit.

## 2023-01-02 NOTE — Assessment & Plan Note (Signed)
Last vitamin D Lab Results  Component Value Date   VD25OH 7.2 (L) 11/20/2022   He is doing well on prescription vitamin D 50,000 IU twice weekly.  He had profound vitamin D deficiency on February labs.  His energy level has been slow to improve.  Continue prescription vitamin D 50,000 IU twice weekly.  Recheck vitamin D level at next visit.

## 2023-01-02 NOTE — Assessment & Plan Note (Signed)
Lab Results  Component Value Date   HGBA1C 5.9 (H) 11/20/2022   He is actively working on dietary change, physical activity and weight reduction.  He has a net weight loss of 14 pounds in the past 6 weeks of medically supervised weight management and is tolerating metformin XR 500 mg once daily with food well.  Continue metformin 500 mg XR once daily with food, prescribed meal plan, regular walking and plan to recheck A1c next visit.

## 2023-01-08 NOTE — Progress Notes (Unsigned)
Patient: David Tanner Date of Birth: [birth year]  Reason for Visit: Follow up History from: Patient Primary Neurologist: Dr.Yan  ASSESSMENT AND PLAN 37 y.o. year old male   1.  Benign intracranial hypertension 2.  Morbid obesity, BMI 52 -Needs to follow-up with ophthalmology, I will place a referral to Dr. Dione BoozeGroat for evaluation of papilledema -He denies any headache or vision change -Continue Topamax 100 mg twice a day -Encouraged to continue to work on weight loss, as weight loss is key for long-term management of intracranial hypertension -Normal MRI of the brain and orbit with without contrast -Fluoroscopy guided in September 2022 lumbar puncture showed opening pressure of 46 cmH2O -Call for any new or worsening symptoms, follow-up with me in 6 months  HISTORY  David Gullyerrell V Delamora is a 37 year old male, seen in request by his primary care physician Dr. Anitra LauthPlunkett, [facility], for evaluation of bilateral papillary edema, initial evaluation was June 14, 2021,   I reviewed and summarized the referring note.PMHX. Obesity HLD Hypothyroidism Vitamin D deficiency History of left lower extremity DVT following left ankle fracture, wearing boots   He had a history of obesity, continued weight gain of 40 pounds since past 4 years, also had a history of hypothyroidism, was on thyroid supplement, but lost insurance few years back, has been ordered for his medications  In April 2022, during [facility], he was noted to have bilateral papillary edema, was referred to ophthalmologist, seen by Dr. Genia DelMincey on March 09, 2021, was noted to have bilateral disc edema, there is no macular, normal vessel, he was referred to emergency room,  Personally reviewed normal MRI brain, orbit w/wo  Laboratory evaluations in 2018 showed negative ANA, complement, RPR, hepatitis C, ANCA, A1c was 5.5, TSH was elevated 7.0, lipid panel LDL 144   He denies frequent headaches, denies visual  changes,   UPDATE Sep 27 2021: Follow-up for benign intracranial hypertension, fluoroscopy guided lumbar puncture on June 25, 2021 showed opening pressure of 46 cmH2O  Is tolerating Topamax 100 mg twice a day well, he denies significant side effect, no significant weight loss, weight is 362 today, he denies significant visual changes  Spinal fluid testing was normal, laboratory evaluation showed normal ESR C-reactive protein, ANA, CMP was elevated creatinine 1.45, normal CBC hemoglobin of 15.4, A1c of 5.9, slight elevated TSH, normal total T4, B12 was decreased to 150, negative RPR, HIV  Update May 23, 2022 SS: Remains on Topamax 100 mg twice daily, no problems. No headaches or vision changes. Weight is 386 lbs, up 24 lbs since Dec 2022, claims tries to work out.  On B12 injections.  Not sure when he last saw his eye doctor.  Update January 09, 2023 SS: remains on Topamax 100 mg BID, has not followed up with eye doctor. No headaches or vision changes. Has lost 11 lbs since lost seen! Is going to weight loss, on metformin, on a diet. No new issues or concerns.  REVIEW OF SYSTEMS: Out of a complete 14 system review of symptoms, the patient complains only of the following symptoms, and all other reviewed systems are negative.  See HPI  ALLERGIES: No Known Allergies  HOME MEDICATIONS: Outpatient Medications Prior to Visit  Medication Sig Dispense Refill   amLODipine (NORVASC) 5 MG tablet Take 1 tablet (5 mg total) by mouth daily. 90 tablet 0   aspirin EC 81 MG tablet Take 81 mg by mouth 2 (two) times daily.     cyanocobalamin (VITAMIN  B12) 1000 MCG/ML injection Inject 1 mL (1,000 mcg total) into the muscle every 14 (fourteen) days. 10 mL 0   levothyroxine (SYNTHROID) 75 MCG tablet Take 1 tablet (75 mcg total) by mouth daily. 90 tablet 0   metFORMIN (GLUCOPHAGE-XR) 500 MG 24 hr tablet Take 1 tablet (500 mg total) by mouth daily with supper. 90 tablet 0   Vitamin D, Ergocalciferol,  (DRISDOL) 1.25 MG (50000 UNIT) CAPS capsule Take 1 capsule (50,000 Units total) by mouth 2 (two) times a week. 10 capsule 0   topiramate (TOPAMAX) 100 MG tablet Take 1 tablet (100 mg total) by mouth 2 (two) times daily. 180 tablet 0   No facility-administered medications prior to visit.    PAST MEDICAL HISTORY: Past Medical History:  Diagnosis Date   DVT (deep venous thrombosis)    Left leg   Hyperlipidemia    Hypothyroid    Vitamin D deficiency     PAST SURGICAL HISTORY: History reviewed. No pertinent surgical history.  FAMILY HISTORY: Family History  Problem Relation Age of Onset   Hypertension Mother    Diabetes Father    Cancer Maternal Grandfather        leukemia    SOCIAL HISTORY: Social History   Socioeconomic History   Marital status: Single    Spouse name: Barista   Number of children: Not on file   Years of education: Not on file   Highest education level: Not on file  Occupational History   Occupation: Delivery Driver  Tobacco Use   Smoking status: Former    Packs/day: 0.25    Years: 1.00    Additional pack years: 0.00    Total pack years: 0.25    Types: Cigarettes   Smokeless tobacco: Never   Tobacco comments:    Patient states he quit smoking about 2 years ago (approx 2020)  Vaping Use   Vaping Use: Never used  Substance and Sexual Activity   Alcohol use: No    Alcohol/week: 0.0 standard drinks of alcohol   Drug use: No   Sexual activity: Yes    Comment: With girlfriend for 4 years  Other Topics Concern   Not on file  Social History Narrative   Lives with wife and 3 kids   Right Handed   Drinks 6-7 cups caffeine   Social Determinants of Health   Financial Resource Strain: Not on file  Food Insecurity: No Food Insecurity (10/10/2021)   Hunger Vital Sign    Worried About Running Out of Food in the Last Year: Never true    Ran Out of Food in the Last Year: Never true  Transportation Needs: Not on file  Physical Activity: Not on file   Stress: Not on file  Social Connections: Not on file  Intimate Partner Violence: Not on file   PHYSICAL EXAM  Vitals:   01/09/23 1536  BP: 123/78  Pulse: (!) 107  Weight: (!) 375 lb (170.1 kg)  Height: 5\' 11"  (1.803 m)    Body mass index is 52.3 kg/m.  Generalized: Well developed, in no acute distress  Neurological examination  Mentation: Alert oriented to time, place, history taking. Follows all commands speech and language fluent Cranial nerve II-XII: Pupils were equal round reactive to light. Extraocular movements were full, visual field were full on confrontational test. I could not visualize the back of the eye well. Facial sensation and strength were normal. Head turning and shoulder shrug were normal and symmetric. Motor: The motor testing reveals 5 over  5 strength of all 4 extremities. Good symmetric motor tone is noted throughout.  Sensory: Sensory testing is intact to soft touch on all 4 extremities. No evidence of extinction is noted.  Coordination: Cerebellar testing reveals good finger-nose-finger and heel-to-shin bilaterally.  Gait and station: Gait is normal. Reflexes: Deep tendon reflexes are symmetric and normal bilaterally.   DIAGNOSTIC DATA (LABS, IMAGING, TESTING) - I reviewed patient records, labs, notes, testing and imaging myself where available.  Lab Results  Component Value Date   WBC 7.1 11/20/2022   HGB 14.7 11/20/2022   HCT 44.2 11/20/2022   MCV 86 11/20/2022   PLT 307 11/20/2022      Component Value Date/Time   NA 144 11/20/2022 1153   K 4.2 11/20/2022 1153   CL 104 11/20/2022 1153   CO2 23 11/20/2022 1153   GLUCOSE 76 11/20/2022 1153   GLUCOSE 87 03/09/2021 1305   BUN 9 11/20/2022 1153   CREATININE 1.19 11/20/2022 1153   CREATININE 1.32 08/25/2017 1655   CALCIUM 9.6 11/20/2022 1153   PROT 6.6 11/20/2022 1153   ALBUMIN 4.4 11/20/2022 1153   AST 21 11/20/2022 1153   ALT 31 11/20/2022 1153   ALKPHOS 49 11/20/2022 1153   BILITOT 0.4  11/20/2022 1153   GFRNONAA 72 08/25/2017 1655   GFRAA 84 08/25/2017 1655   Lab Results  Component Value Date   CHOL 241 (H) 11/20/2022   HDL 46 11/20/2022   LDLCALC 150 (H) 11/20/2022   TRIG 247 (H) 11/20/2022   CHOLHDL 5.2 (H) 11/20/2022   Lab Results  Component Value Date   HGBA1C 5.9 (H) 11/20/2022   Lab Results  Component Value Date   VITAMINB12 269 11/20/2022   Lab Results  Component Value Date   TSH 5.500 (H) 11/20/2022    Margie Ege, AGNP-C, DNP 01/09/2023, 3:59 PM [facility] [address], [address] [place], [place] [address] [phone]

## 2023-01-09 ENCOUNTER — Ambulatory Visit: Payer: BC Managed Care – PPO | Admitting: Neurology

## 2023-01-09 ENCOUNTER — Encounter: Payer: Self-pay | Admitting: Neurology

## 2023-01-09 VITALS — BP 123/78 | HR 107 | Ht 71.0 in | Wt 375.0 lb

## 2023-01-09 DIAGNOSIS — G932 Benign intracranial hypertension: Secondary | ICD-10-CM

## 2023-01-09 MED ORDER — TOPIRAMATE 100 MG PO TABS: 100.0000 mg | ORAL_TABLET | Freq: Two times a day (BID) | ORAL | 3 refills | Status: DC

## 2023-01-09 NOTE — Patient Instructions (Addendum)
I referred you to eye doctor, Dr. Dione Booze, please go to have your eyes examined looking for increased pressure, very important!  Continue the Topamax   Work on weight loss, key for management of your condition   Call for any worsening headache, vision change

## 2023-01-10 ENCOUNTER — Telehealth: Payer: Self-pay | Admitting: Neurology

## 2023-01-10 NOTE — Telephone Encounter (Signed)
Ophthalmology referral faxed to [facility] (fax# [phone], phone# [phone]) 

## 2023-02-06 ENCOUNTER — Encounter (INDEPENDENT_AMBULATORY_CARE_PROVIDER_SITE_OTHER): Payer: Self-pay | Admitting: Family Medicine

## 2023-02-06 ENCOUNTER — Ambulatory Visit (INDEPENDENT_AMBULATORY_CARE_PROVIDER_SITE_OTHER): Payer: BC Managed Care – PPO | Admitting: Family Medicine

## 2023-02-06 VITALS — BP 117/73 | HR 68 | Temp 98.7°F | Ht 71.0 in | Wt 365.0 lb

## 2023-02-06 DIAGNOSIS — E669 Obesity, unspecified: Secondary | ICD-10-CM

## 2023-02-06 DIAGNOSIS — Z6841 Body Mass Index (BMI) 40.0 and over, adult: Secondary | ICD-10-CM

## 2023-02-06 DIAGNOSIS — E039 Hypothyroidism, unspecified: Secondary | ICD-10-CM

## 2023-02-06 DIAGNOSIS — E038 Other specified hypothyroidism: Secondary | ICD-10-CM

## 2023-02-06 DIAGNOSIS — E538 Deficiency of other specified B group vitamins: Secondary | ICD-10-CM

## 2023-02-06 DIAGNOSIS — I10 Essential (primary) hypertension: Secondary | ICD-10-CM

## 2023-02-06 DIAGNOSIS — E559 Vitamin D deficiency, unspecified: Secondary | ICD-10-CM

## 2023-02-06 DIAGNOSIS — R7303 Prediabetes: Secondary | ICD-10-CM

## 2023-02-06 DIAGNOSIS — E88819 Insulin resistance, unspecified: Secondary | ICD-10-CM

## 2023-02-06 MED ORDER — AMLODIPINE BESYLATE 5 MG PO TABS: 5.0000 mg | ORAL_TABLET | Freq: Every day | ORAL | 0 refills | Status: DC

## 2023-02-06 MED ORDER — LEVOTHYROXINE SODIUM 75 MCG PO TABS: 75.0000 ug | ORAL_TABLET | Freq: Every day | ORAL | 0 refills | Status: DC

## 2023-02-06 MED ORDER — CYANOCOBALAMIN 1000 MCG/ML IJ SOLN
1000.0000 ug | INTRAMUSCULAR | 0 refills | Status: DC
Start: 2023-02-06 — End: 2023-06-26

## 2023-02-06 MED ORDER — VITAMIN D (ERGOCALCIFEROL) 1.25 MG (50000 UNIT) PO CAPS
50000.0000 [IU] | ORAL_CAPSULE | ORAL | 0 refills | Status: DC
Start: 2023-02-06 — End: 2023-04-01

## 2023-02-06 MED ORDER — METFORMIN HCL ER 500 MG PO TB24: 500.0000 mg | ORAL_TABLET | Freq: Every day | ORAL | 0 refills | Status: DC

## 2023-02-06 NOTE — Progress Notes (Signed)
Office: [phone]  /  Fax: [phone]  WEIGHT SUMMARY AND BIOMETRICS  Starting Date: 11/20/22  Starting Weight: 380lb   Weight Lost Since Last Visit: 4lb   Vitals Temp: 98.7 F (37.1 C) BP: 117/73 Pulse Rate: 68 SpO2: 96 %   Body Composition  Body Fat %: 43.1 % Fat Mass (lbs): 157.6 lbs Muscle Mass (lbs): 198 lbs Total Body Water (lbs): 164 lbs Visceral Fat Rating : 29     HPI  Chief Complaint: OBESITY  David Tanner is here to discuss his progress with his obesity treatment plan. He is on the following a lower carbohydrate, vegetable and lean protein rich diet plan and states he is following his eating plan approximately 50 % of the time. He states he is exercising 30 minutes 7 times per week.   Interval History:  Since last office visit he is down 4 lb He is maintaining his muscle mass and is adherent to his prescribed diet though is adding snacks like a nutri grain bar at night He brings lunch to work and has breakfast at home before work He is mindful when eating out He is physically active at work and has not added in any outside exercise He is doing some yardwork   Pharmacotherapy: none  PHYSICAL EXAM:  Blood pressure 117/73, pulse 68, temperature 98.7 F (37.1 C), height 5\' 11"  (1.803 m), weight (!) 365 lb (165.6 kg), SpO2 96 %. Body mass index is 50.91 kg/m.  General: He is overweight, cooperative, alert, well developed, and in no acute distress. PSYCH: Has normal mood, affect and thought process.   Lungs: Normal breathing effort, no conversational dyspnea.   ASSESSMENT AND PLAN  TREATMENT PLAN FOR OBESITY:  Recommended Dietary Goals  [name] is currently in the action stage of change. As such, his goal is to continue weight management plan. He has agreed to following a lower carbohydrate, vegetable and lean protein rich diet plan.  Behavioral Intervention  We discussed the following Behavioral Modification Strategies today: increasing  lean protein intake, decreasing simple carbohydrates , increasing vegetables, increasing lower glycemic fruits, increasing water intake, reading food labels , keeping healthy foods at home, avoiding temptations and identifying enticing environmental cues, continue to work on implementation of reduced calorie nutritional plan, continue to practice mindfulness when eating, and planning for success.  Additional resources provided today: NA  Recommended Physical Activity Goals  [name] has been advised to work up to 150 minutes of moderate intensity aerobic activity a week and strengthening exercises 2-3 times per week for cardiovascular health, weight loss maintenance and preservation of muscle mass.   He has agreed to [employer] strengthening exercises with a goal of 2-3 sessions a week   Pharmacotherapy changes for the treatment of obesity: none  ASSOCIATED CONDITIONS ADDRESSED TODAY  Hypothyroidism, unspecified type Assessment & Plan: He is back on levothyroxine 75 mcg daily Energy level is improving  Recheck TSH today  Orders: -     TSH  Essential hypertension Assessment & Plan: BP well controlled on amlodopine 5 mg daily Denies adverse SE on medication  Continue amlodipine 5 mg daily Continue active plan for weight loss   Orders: -     amLODIPine Besylate; Take 1 tablet (5 mg total) by mouth daily.  Dispense: 90 tablet; Refill: 0 -     Comprehensive metabolic panel  Vitamin B12 deficiency Assessment & Plan: Doing well back on B12 injections q 2 weeks Energy level is improving Denies paresthesias   Recheck B12 level today  Orders: -     Cyanocobalamin; Inject 1 mL (1,000 mcg total) into the muscle every 14 (fourteen) days.  Dispense: 10 mL; Refill: 0 -     Vitamin B12  Other specified hypothyroidism Assessment & Plan: He is back on levothyroxine 75 mcg daily Energy level is improving  Recheck TSH today  Orders: -     Levothyroxine Sodium; Take 1 tablet (75 mcg  total) by mouth daily.  Dispense: 90 tablet; Refill: 0  Insulin resistance Assessment & Plan: He is doing well with weight reduction, down 15 lb in 2 mos of medically supervised weight management. He has cut back on added sugars and excess starches He is physically active at work  He is doing well on metformin XR 500 mg once daily with food  Recheck CMP today  Orders: -     metFORMIN HCl ER; Take 1 tablet (500 mg total) by mouth daily with supper.  Dispense: 90 tablet; Refill: 0 -     Hemoglobin A1c  Vitamin D deficiency Assessment & Plan: Last vitamin D Lab Results  Component Value Date   VD25OH 7.2 (L) 11/20/2022   He is doing well on RX vitamin D 50,000 IU twice weekly Energy level is improving Denies adverse SE  Recheck vitamin D level today  Orders: -     Vitamin D (Ergocalciferol); Take 1 capsule (50,000 Units total) by mouth 2 (two) times a week.  Dispense: 10 capsule; Refill: 0 -     VITAMIN D 25 Hydroxy (Vit-D Deficiency, Fractures)  Pre-diabetes Assessment & Plan: Lab Results  Component Value Date   HGBA1C 5.9 (H) 11/20/2022   He is doing well with healthy lifestyle changes, weight loss and metformin XR 500 mg once daily.  Continue prescribed meal plan Reviewed nutrition labels together looking at good snack choices ~150 cal, 10% protein content of total calories       He was informed of the importance of frequent follow up visits to maximize his success with intensive lifestyle modifications for his multiple health conditions.   ATTESTASTION STATEMENTS:  Reviewed by clinician on day of visit: allergies, medications, problem list, medical history, surgical history, family history, social history, and previous encounter notes pertinent to obesity diagnosis.   I have personally spent 30 minutes total time today in preparation, patient care, nutritional counseling and documentation for this visit, including the following: review of clinical lab tests;  review of medical tests/procedures/services.      Glennis Brink, DO DABFM, DABOM [facility] [address] [place], [place] [address] [phone]

## 2023-02-06 NOTE — Assessment & Plan Note (Signed)
Last vitamin D Lab Results  Component Value Date   VD25OH 7.2 (L) 11/20/2022   He is doing well on RX vitamin D 50,000 IU twice weekly Energy level is improving Denies adverse SE  Recheck vitamin D level today

## 2023-02-06 NOTE — Assessment & Plan Note (Signed)
Lab Results  Component Value Date   HGBA1C 5.9 (H) 11/20/2022   He is doing well with healthy lifestyle changes, weight loss and metformin XR 500 mg once daily.  Continue prescribed meal plan Reviewed nutrition labels together looking at good snack choices ~150 cal, 10% protein content of total calories

## 2023-02-06 NOTE — Assessment & Plan Note (Signed)
Doing well back on B12 injections q 2 weeks Energy level is improving Denies paresthesias   Recheck B12 level today

## 2023-02-06 NOTE — Assessment & Plan Note (Signed)
He is back on levothyroxine 75 mcg daily Energy level is improving  Recheck TSH today

## 2023-02-06 NOTE — Assessment & Plan Note (Signed)
He is doing well with weight reduction, down 15 lb in 2 mos of medically supervised weight management. He has cut back on added sugars and excess starches He is physically active at work  He is doing well on metformin XR 500 mg once daily with food  Recheck CMP today

## 2023-02-06 NOTE — Assessment & Plan Note (Signed)
BP well controlled on amlodopine 5 mg daily Denies adverse SE on medication  Continue amlodipine 5 mg daily Continue active plan for weight loss

## 2023-02-07 LAB — COMPREHENSIVE METABOLIC PANEL
ALT: 21 IU/L (ref 0–44)
AST: 20 IU/L (ref 0–40)
Albumin/Globulin Ratio: 2 (ref 1.2–2.2)
Albumin: 4.5 g/dL (ref 4.1–5.1)
Alkaline Phosphatase: 54 IU/L (ref 44–121)
BUN/Creatinine Ratio: 8 — ABNORMAL LOW (ref 9–20)
BUN: 12 mg/dL (ref 6–20)
Bilirubin Total: 0.3 mg/dL (ref 0.0–1.2)
CO2: 22 mmol/L (ref 20–29)
Calcium: 9.6 mg/dL (ref 8.7–10.2)
Chloride: 105 mmol/L (ref 96–106)
Creatinine, Ser: 1.48 mg/dL — ABNORMAL HIGH (ref 0.76–1.27)
Globulin, Total: 2.2 g/dL (ref 1.5–4.5)
Glucose: 78 mg/dL (ref 70–99)
Potassium: 3.9 mmol/L (ref 3.5–5.2)
Sodium: 144 mmol/L (ref 134–144)
Total Protein: 6.7 g/dL (ref 6.0–8.5)
eGFR: 63 mL/min/{1.73_m2} (ref >59)

## 2023-02-07 LAB — HEMOGLOBIN A1C
Est. average glucose Bld gHb Est-mCnc: 117 mg/dL
Hgb A1c MFr Bld: 5.7 % — ABNORMAL HIGH (ref 4.8–5.6)

## 2023-02-07 LAB — VITAMIN D 25 HYDROXY (VIT D DEFICIENCY, FRACTURES): Vit D, 25-Hydroxy: 21.5 ng/mL — ABNORMAL LOW (ref 30.0–100.0)

## 2023-02-07 LAB — VITAMIN B12: Vitamin B-12: 234 pg/mL (ref 232–1245)

## 2023-03-06 ENCOUNTER — Ambulatory Visit (INDEPENDENT_AMBULATORY_CARE_PROVIDER_SITE_OTHER): Payer: 59 | Admitting: Physician Assistant

## 2023-03-06 ENCOUNTER — Encounter (INDEPENDENT_AMBULATORY_CARE_PROVIDER_SITE_OTHER): Payer: Self-pay | Admitting: Physician Assistant

## 2023-03-06 VITALS — BP 135/85 | HR 67 | Temp 98.3°F | Ht 71.0 in | Wt 367.0 lb

## 2023-03-06 DIAGNOSIS — E538 Deficiency of other specified B group vitamins: Secondary | ICD-10-CM | POA: Diagnosis not present

## 2023-03-06 DIAGNOSIS — G932 Benign intracranial hypertension: Secondary | ICD-10-CM

## 2023-03-06 DIAGNOSIS — E559 Vitamin D deficiency, unspecified: Secondary | ICD-10-CM

## 2023-03-06 DIAGNOSIS — R7303 Prediabetes: Secondary | ICD-10-CM | POA: Diagnosis not present

## 2023-03-06 DIAGNOSIS — I10 Essential (primary) hypertension: Secondary | ICD-10-CM | POA: Diagnosis not present

## 2023-03-06 DIAGNOSIS — Z6841 Body Mass Index (BMI) 40.0 and over, adult: Secondary | ICD-10-CM

## 2023-03-06 NOTE — Progress Notes (Unsigned)
.smr  Office: [phone]  /  Fax: [phone]  WEIGHT SUMMARY AND BIOMETRICS  Vitals Temp: 98.3 F (36.8 C) BP: 135/85 Pulse Rate: 67 SpO2: 94 %   Anthropometric Measurements Height: 5\' 11"  (1.803 m) Weight: (!) 367 lb (166.5 kg) BMI (Calculated): 51.21 Weight at Last Visit: 365 lb Weight Lost Since Last Visit: 0 lb Weight Gained Since Last Visit: 2 lb Starting Weight: 380 lb   Body Composition  Body Fat %: 43.9 % Fat Mass (lbs): 161.2 lbs Muscle Mass (lbs): 196 lbs Total Body Water (lbs): 164.4 lbs Visceral Fat Rating : 29   Other Clinical Data Fasting: No Labs: No Today's Visit #: 5 Starting Date: 11/20/22     HPI  Chief Complaint: OBESITY  David Tanner is here to discuss his progress with his obesity treatment plan. He is on the following a lower carbohydrate, vegetable and lean protein rich diet plan and states he is following his eating plan approximately 75 % of the time. He states he is exercising/lifting and walking at work 5 times per week.   Interval History:  Since last office visit he is up 2 lbs.  He had difficulty staying on low carb plan as having difficulty with meal planning and wife cooking spaghetti and other items not on plan. We discussed other plan options and may be able to follow Category 4 plan more easily.  We discussed strategies to try to improve adherence to program.  Hunger/appetite-not excessive Cravings- not excessive. Has tried to give up sugar- not drinking sodas anymore and not snacking or eating sweets that are in the home Exercise-Job involves heavy lifting and walking during work- Feels exhausted at the end of work day.  Hydration-Feels like he is drinking more water, staying away from sugar sweetened drinks.    Pharmacotherapy: Metformin for primary indication of prediabetes. No GI side effects.   TREATMENT PLAN FOR OBESITY:  Recommended Dietary Goals  Coehn is currently in the action stage of change. As such, his  goal is to continue weight management plan. He has agreed to the Category 4 Plan.  Behavioral Intervention  We discussed the following Behavioral Modification Strategies today: increasing lean protein intake, decreasing simple carbohydrates , increasing vegetables, increasing lower glycemic fruits, increasing fiber rich foods, avoiding skipping meals, increasing water intake, work on meal planning and preparation, keeping healthy foods at home, continue to practice mindfulness when eating, and planning for success.  Additional resources provided today: NA  Recommended Physical Activity Goals  Sterlin has been advised to work up to 150 minutes of moderate intensity aerobic activity a week and strengthening exercises 2-3 times per week for cardiovascular health, weight loss maintenance and preservation of muscle mass.   He has agreed to Continue current level of physical activity    Pharmacotherapy We discussed various medication options to help Sidharth with his weight loss efforts and we both agreed to continue metformin for primary indication of prediabetes.    Return in about 4 weeks (around 04/03/2023).Marland Kitchen He was informed of the importance of frequent follow up visits to maximize his success with intensive lifestyle modifications for his multiple health conditions.  PHYSICAL EXAM:  Blood pressure 135/85, pulse 67, temperature 98.3 F (36.8 C), height 5\' 11"  (1.803 m), weight (!) 367 lb (166.5 kg), SpO2 94 %. Body mass index is 51.19 kg/m.  General: He is overweight, cooperative, alert, well developed, and in no acute distress. PSYCH: Has normal mood, affect and thought process.   Cardiovascular: HR 60's regular. BP  135/85 Lungs: Normal breathing effort, no conversational dyspnea. Neuro: no focal deficit  DIAGNOSTIC DATA REVIEWED:  BMET    Component Value Date/Time   NA 144 02/06/2023 1632   K 3.9 02/06/2023 1632   CL 105 02/06/2023 1632   CO2 22 02/06/2023 1632   GLUCOSE 78  02/06/2023 1632   GLUCOSE 87 03/09/2021 1305   BUN 12 02/06/2023 1632   CREATININE 1.48 (H) 02/06/2023 1632   CREATININE 1.32 08/25/2017 1655   CALCIUM 9.6 02/06/2023 1632   GFRNONAA 72 08/25/2017 1655   GFRAA 84 08/25/2017 1655   Lab Results  Component Value Date   HGBA1C 5.7 (H) 02/06/2023   HGBA1C 5.6 12/31/2013   Lab Results  Component Value Date   INSULIN 27.5 (H) 11/20/2022   Lab Results  Component Value Date   TSH 5.500 (H) 11/20/2022   CBC    Component Value Date/Time   WBC 7.1 11/20/2022 1153   WBC 7.5 08/25/2017 1655   RBC 5.13 11/20/2022 1153   RBC 5.18 08/25/2017 1655   HGB 14.7 11/20/2022 1153   HCT 44.2 11/20/2022 1153   PLT 307 11/20/2022 1153   MCV 86 11/20/2022 1153   MCH 28.7 11/20/2022 1153   MCH 28.2 08/25/2017 1655   MCHC 33.3 11/20/2022 1153   MCHC 33.5 08/25/2017 1655   RDW 13.7 11/20/2022 1153   Iron Studies    Component Value Date/Time   IRON 56 03/14/2016 1056   TIBC 348 03/14/2016 1056   FERRITIN 76 03/14/2016 1056   IRONPCTSAT 16 03/14/2016 1056   Lipid Panel     Component Value Date/Time   CHOL 241 (H) 11/20/2022 1153   TRIG 247 (H) 11/20/2022 1153   HDL 46 11/20/2022 1153   CHOLHDL 5.2 (H) 11/20/2022 1153   CHOLHDL 4.8 05/20/2017 1215   VLDL 31 (H) 02/06/2017 1151   LDLCALC 150 (H) 11/20/2022 1153   LDLCALC 144 (H) 05/20/2017 1215   Hepatic Function Panel     Component Value Date/Time   PROT 6.7 02/06/2023 1632   ALBUMIN 4.5 02/06/2023 1632   AST 20 02/06/2023 1632   ALT 21 02/06/2023 1632   ALKPHOS 54 02/06/2023 1632   BILITOT 0.3 02/06/2023 1632   BILIDIR 0.1 05/20/2017 1215   IBILI 0.5 05/20/2017 1215      Component Value Date/Time   TSH 5.500 (H) 11/20/2022 1153   Nutritional Lab Results  Component Value Date   VD25OH 21.5 (L) 02/06/2023   VD25OH 7.2 (L) 11/20/2022   VD25OH 18 (L) 05/20/2017    ASSOCIATED CONDITIONS ADDRESSED TODAY  ASSESSMENT AND PLAN  Problem List Items Addressed This Visit      Vitamin D deficiency   Essential hypertension   Morbid obesity (HCC) with starting BMI 53   Vitamin B12 deficiency   Pre-diabetes - Primary   Benign intracranial hypertension   Other Visit Diagnoses     BMI 50.0-59.9, adult (HCC)          Prediabetes Last A1c was ***  Medication(s): {dwwpharmacotherapy:[address]} Polyphagia:{dwwyes:29172} Lab Results  Component Value Date   HGBA1C 5.7 (H) 02/06/2023   HGBA1C 5.9 (H) 11/20/2022   HGBA1C 5.8 (H) 12/11/2021   HGBA1C 5.9 (H) 06/14/2021   HGBA1C 5.5 05/20/2017   Lab Results  Component Value Date   INSULIN 27.5 (H) 11/20/2022    Plan: {dwwmed:29123} {dwwpharmacotherapy:29109} Hypertension Hypertension {disease control degree:315147}.  Medication(s): ***  BP Readings from Last 3 Encounters:  03/06/23 135/85  02/06/23 117/73  01/09/23 123/78   Lab  Results  Component Value Date   CREATININE 1.48 (H) 02/06/2023   CREATININE 1.19 11/20/2022   CREATININE 1.45 (H) 06/14/2021   No results found for: "GFR"  Plan: Continue all antihypertensives at current dosages.  Vitamin D Deficiency Vitamin D {CHL AMB IS/IS NOT:210130109} at goal of 50.  Most recent vitamin D level was ***. He is on {dwwslvitdrx:29084}. Lab Results  Component Value Date   VD25OH 21.5 (L) 02/06/2023   VD25OH 7.2 (L) 11/20/2022   VD25OH 18 (L) 05/20/2017    Plan: {dwwmed:29123} {dwwslvitdrx:29084}  B 12 deficiency:  Benign intracranial hypertension; Followed regularly by neurology. Needs to complete eye exam with Dr. [facility].  On topamax 100 mg twice daily without side effects.  Plan: Continue medications and working on nutrition plan and exercise to promote weight loss and improve benign intracranial hypertension.   ATTESTASTION STATEMENTS:  Reviewed by clinician on day of visit: allergies, medications, problem list, medical history, surgical history, family history, social history, and previous encounter notes.   I have  personally spent 30 minutes total time today in preparation, patient care, nutritional counseling and documentation for this visit, including the following: review of clinical lab tests; review of medical tests/procedures/services.      Avid Guillette, PA-C

## 2023-04-01 ENCOUNTER — Ambulatory Visit (INDEPENDENT_AMBULATORY_CARE_PROVIDER_SITE_OTHER): Payer: 59 | Admitting: Family Medicine

## 2023-04-01 ENCOUNTER — Encounter (INDEPENDENT_AMBULATORY_CARE_PROVIDER_SITE_OTHER): Payer: Self-pay | Admitting: Family Medicine

## 2023-04-01 VITALS — BP 141/84 | HR 86 | Temp 98.2°F | Ht 71.0 in | Wt 376.0 lb

## 2023-04-01 DIAGNOSIS — I10 Essential (primary) hypertension: Secondary | ICD-10-CM | POA: Diagnosis not present

## 2023-04-01 DIAGNOSIS — E538 Deficiency of other specified B group vitamins: Secondary | ICD-10-CM

## 2023-04-01 DIAGNOSIS — E038 Other specified hypothyroidism: Secondary | ICD-10-CM | POA: Diagnosis not present

## 2023-04-01 DIAGNOSIS — E559 Vitamin D deficiency, unspecified: Secondary | ICD-10-CM

## 2023-04-01 DIAGNOSIS — E88819 Insulin resistance, unspecified: Secondary | ICD-10-CM | POA: Diagnosis not present

## 2023-04-01 DIAGNOSIS — Z6841 Body Mass Index (BMI) 40.0 and over, adult: Secondary | ICD-10-CM

## 2023-04-01 MED ORDER — VITAMIN D (ERGOCALCIFEROL) 1.25 MG (50000 UNIT) PO CAPS
50000.0000 [IU] | ORAL_CAPSULE | ORAL | 0 refills | Status: DC
Start: 2023-04-03 — End: 2023-06-26

## 2023-04-01 MED ORDER — SEMAGLUTIDE(0.25 OR 0.5MG/DOS) 2 MG/3ML ~~LOC~~ SOPN
PEN_INJECTOR | SUBCUTANEOUS | 0 refills | Status: DC
Start: 2023-04-01 — End: 2023-05-27

## 2023-04-01 MED ORDER — AMLODIPINE BESYLATE 10 MG PO TABS
10.0000 mg | ORAL_TABLET | Freq: Every day | ORAL | 0 refills | Status: DC
Start: 2023-04-01 — End: 2023-04-29

## 2023-04-01 NOTE — Progress Notes (Unsigned)
Office: [phone]  /  Fax: [phone]  WEIGHT SUMMARY AND BIOMETRICS  Starting Date: 11/20/22  Starting Weight: 380lb   Weight Lost Since Last Visit: 0lb   Vitals Temp: 98.2 F (36.8 C) BP: (!) 141/84 Pulse Rate: 86 SpO2: 97 %   Body Composition  Body Fat %: 43.7 % Fat Mass (lbs): 164.6 lbs Muscle Mass (lbs): 201.8 lbs Total Body Water (lbs): 166.4 lbs Visceral Fat Rating : 30     HPI  Chief Complaint: OBESITY  David Tanner is here to discuss his progress with his obesity treatment plan. He is on the following a lower carbohydrate, vegetable and lean protein rich diet plan and states he is following his eating plan approximately 70 % of the time. He states he is doing a lot of exercise at work.    Interval History:  Since last office visit he is up 9 lb in the past month He has a net weight loss of 4 lb in the past 4 mos He admits to being off his meal plan He has a physically active job He has been too tired to add in more exercise after work He is eating out more often He is taking Topamax for ICH but has not seen much change in food impulse control   Pharmacotherapy: metformin XR 500 mg with dinner  PHYSICAL EXAM:  Blood pressure (!) 141/84, pulse 86, temperature 98.2 F (36.8 C), height 5\' 11"  (1.803 m), weight (!) 376 lb (170.6 kg), SpO2 97 %. Body mass index is 52.44 kg/m.  General: He is overweight, cooperative, alert, well developed, and in no acute distress. PSYCH: Has normal mood, affect and thought process.   Lungs: Normal breathing effort, no conversational dyspnea.   ASSESSMENT AND PLAN  TREATMENT PLAN FOR OBESITY:  Recommended Dietary Goals  David Tanner is currently in the action stage of change. As such, his goal is to continue weight management plan. He has agreed to following a lower carbohydrate, vegetable and lean protein rich diet plan.  Behavioral Intervention  We discussed the following Behavioral Modification Strategies  today: increasing lean protein intake, decreasing simple carbohydrates , increasing vegetables, increasing lower glycemic fruits, avoiding skipping meals, increasing water intake, continue to work on implementation of reduced calorie nutritional plan, continue to practice mindfulness when eating, planning for success, and better snacking choices.  Additional resources provided today: NA  Recommended Physical Activity Goals  David Tanner has been advised to work up to 150 minutes of moderate intensity aerobic activity a week and strengthening exercises 2-3 times per week for cardiovascular health, weight loss maintenance and preservation of muscle mass.   He has agreed to Think about ways to increase daily physical activity and overcoming barriers to exercise  Pharmacotherapy changes for the treatment of obesity: begin Ozempic 0.25 mg once weekly injection for IR  ASSOCIATED CONDITIONS ADDRESSED TODAY  Essential hypertension Assessment & Plan: BP continues to run high.  Currently on amlodopine 5 mg daily and denies adverse side effects.  Actively working on weight reduction.  Denies CP or headache.    Increase Amlodopine to 10 mg daily.  F/u with PCP for further management Continue active plan for weight reduction  Orders: -     amLODIPine Besylate; Take 1 tablet (10 mg total) by mouth daily.  Dispense: 90 tablet; Refill: 0  Vitamin D deficiency Assessment & Plan: Last vitamin D Lab Results  Component Value Date   VD25OH 21.5 (L) 02/06/2023   He is doing well on RX ergocalciferol weekly.  Energy level is slowly improving.  Denies adverse SE  Recheck vitamin D level next visit.  Orders: -     Vitamin D (Ergocalciferol); Take 1 capsule (50,000 Units total) by mouth 2 (two) times a week.  Dispense: 10 capsule; Refill: 0  Insulin resistance Assessment & Plan: Last fasting insulin 27.5 2/21 taking metformin XR 500 mg once daily.  Denies adverse SE.  Working on reducing intake of sugar  and starches but has not seen 5% TBW loss in the past 4 mos.  Still having hyperphagia and cravings due to IR.  Continue metformin XR 500 mg once daily with dinner.  Recheck chemistry panel, fasting insulin next visit. Begin Ozempic 0.25 mg once weekly injection for IR. Patient denies a personal or family history of pancreatitis, medullary thyroid carcinoma or multiple endocrine neoplasia type II. Recommend reviewing pen training video online.   Orders: -     Semaglutide(0.25 or 0.5MG /DOS); 0.25 mg once weekly injection  Dispense: 3 mL; Refill: 0  Morbid obesity (HCC) with starting BMI 53  BMI 50.0-59.9, adult (HCC)  Other specified hypothyroidism Assessment & Plan: Doing well on Levothyroxine 75 mcg daily.  Energy level has improved.  He has not been back to his PCP to recheck level.   Continue current dose of levothyroxine. Recheck TSH next visit.   Vitamin B12 deficiency Assessment & Plan: Currently using vitamin B12 1,000 mcg injection q 2 weeks.  Last B12 level 5/9 still low at 234.  Energy level slowly improving.  Denies paresthesias or brain fog.  Continue current dose of vitamin B12 injections. Recheck level next visit       He was informed of the importance of frequent follow up visits to maximize his success with intensive lifestyle modifications for his multiple health conditions.   ATTESTASTION STATEMENTS:  Reviewed by clinician on day of visit: allergies, medications, problem list, medical history, surgical history, family history, social history, and previous encounter notes pertinent to obesity diagnosis.   I have personally spent 30 minutes total time today in preparation, patient care, nutritional counseling and documentation for this visit, including the following: review of clinical lab tests; review of medical tests/procedures/services.      Glennis Brink, DO DABFM, DABOM [facility] [address] [place], [place]  [address] [phone]

## 2023-04-02 NOTE — Assessment & Plan Note (Signed)
BP continues to run high.  Currently on amlodopine 5 mg daily and denies adverse side effects.  Actively working on weight reduction.  Denies CP or headache.    Increase Amlodopine to 10 mg daily.  F/u with PCP for further management Continue active plan for weight reduction

## 2023-04-02 NOTE — Assessment & Plan Note (Signed)
Currently using vitamin B12 1,000 mcg injection q 2 weeks.  Last B12 level 5/9 still low at 234.  Energy level slowly improving.  Denies paresthesias or brain fog.  Continue current dose of vitamin B12 injections. Recheck level next visit

## 2023-04-02 NOTE — Assessment & Plan Note (Signed)
Doing well on Levothyroxine 75 mcg daily.  Energy level has improved.  He has not been back to his PCP to recheck level.   Continue current dose of levothyroxine. Recheck TSH next visit.

## 2023-04-02 NOTE — Assessment & Plan Note (Signed)
Last vitamin D Lab Results  Component Value Date   VD25OH 21.5 (L) 02/06/2023   He is doing well on RX ergocalciferol weekly.  Energy level is slowly improving.  Denies adverse SE  Recheck vitamin D level next visit.

## 2023-04-02 NOTE — Assessment & Plan Note (Signed)
Last fasting insulin 27.5 2/21 taking metformin XR 500 mg once daily.  Denies adverse SE.  Working on reducing intake of sugar and starches but has not seen 5% TBW loss in the past 4 mos.  Still having hyperphagia and cravings due to IR.  Continue metformin XR 500 mg once daily with dinner.  Recheck chemistry panel, fasting insulin next visit. Begin Ozempic 0.25 mg once weekly injection for IR. Patient denies a personal or family history of pancreatitis, medullary thyroid carcinoma or multiple endocrine neoplasia type II. Recommend reviewing pen training video online.

## 2023-04-29 ENCOUNTER — Encounter (INDEPENDENT_AMBULATORY_CARE_PROVIDER_SITE_OTHER): Payer: Self-pay | Admitting: Internal Medicine

## 2023-04-29 ENCOUNTER — Ambulatory Visit (INDEPENDENT_AMBULATORY_CARE_PROVIDER_SITE_OTHER): Payer: 59 | Admitting: Internal Medicine

## 2023-04-29 ENCOUNTER — Telehealth (INDEPENDENT_AMBULATORY_CARE_PROVIDER_SITE_OTHER): Payer: Self-pay | Admitting: Family Medicine

## 2023-04-29 DIAGNOSIS — I10 Essential (primary) hypertension: Secondary | ICD-10-CM | POA: Diagnosis not present

## 2023-04-29 DIAGNOSIS — E88819 Insulin resistance, unspecified: Secondary | ICD-10-CM

## 2023-04-29 DIAGNOSIS — E038 Other specified hypothyroidism: Secondary | ICD-10-CM | POA: Diagnosis not present

## 2023-04-29 DIAGNOSIS — E669 Obesity, unspecified: Secondary | ICD-10-CM | POA: Diagnosis not present

## 2023-04-29 DIAGNOSIS — Z6841 Body Mass Index (BMI) 40.0 and over, adult: Secondary | ICD-10-CM

## 2023-04-29 NOTE — Progress Notes (Unsigned)
Office: [phone]  /  Fax: [phone]  WEIGHT SUMMARY AND BIOMETRICS  Vitals Temp: 98.8 F (37.1 C) BP: 130/84 Pulse Rate: 84 SpO2: 97 %   Anthropometric Measurements Height: 5\' 11"  (1.803 m) Weight: (!) 379 lb (171.9 kg) BMI (Calculated): 52.88 Weight at Last Visit: 376 lb Weight Lost Since Last Visit: 0 lb Weight Gained Since Last Visit: 3 lb Starting Weight: 380 lb Total Weight Loss (lbs): 6 lb (2.722 kg)   Body Composition  Body Fat %: 44.5 % Fat Mass (lbs): 168.8 lbs Muscle Mass (lbs): 200.4 lbs Total Body Water (lbs): 168.2 lbs Visceral Fat Rating : 31    No data recorded Today's Visit #: 7  Starting Date: 11/20/22   HPI  Chief Complaint: OBESITY  David Tanner is here to discuss his progress with his obesity treatment plan. He is on the following a lower carbohydrate, vegetable and lean protein rich diet plan and states he is following his eating plan approximately 70-75 % of the time. He states he is exercising 60-90 minutes 2 times per week.  Interval History:  This is my first encounter with [name].  He is a pleasant 37 year old male affected by obesity.  Since last office visit he has gained 3 pounds.  When asked about nutritional patterns he has a hard time describing what he is doing.  He is not following one of our meal plans and is working on portion control but is also not measuring or tracking calories.  He is currently on metformin and has been prescribed semaglutide by my colleague but patient is transitioning insurances as well as providers.  He is also on topiramate for headaches.  He notes gaining weight not during childhood but after having his first born.  He initially lost weight at the beginning of weight loss program but is experiencing some weight regain.  He has symptoms suspicious for sleep apnea but has not had a sleep study.   Orixegenic Control: Reports problems with appetite and hunger signals.  Reports problems with satiety  and satiation.  Reports problems with eating patterns and portion control.  Denies abnormal cravings. Denies feeling deprived or restricted.   Barriers identified: strong hunger signals and appetite, having difficulty preparing healthy meals, having difficulty with meal prep and planning, having difficulty focusing on healthy eating, and cost of medication.   Pharmacotherapy for weight loss: He is currently taking Metformin (off label use for incretin effect and / or insulin resistance and / or diabetes prevention) with adequate clinical response  and without side effects..    ASSESSMENT AND PLAN  TREATMENT PLAN FOR OBESITY:  Recommended Dietary Goals  Zakyrie is currently in the action stage of change. As such, his goal is to continue weight management plan. He has agreed to: keep a food journal with a target of  2500 calories and 30-40 grams of protein per meal  Behavioral Intervention  We discussed the following Behavioral Modification Strategies today: increasing lean protein intake, decreasing simple carbohydrates , increasing vegetables, increasing lower glycemic fruits, increasing fiber rich foods, increasing water intake, work on meal planning and preparation, work on tracking and journaling calories using tracking application, reading food labels , and planning for success.  Additional resources provided today: Handout and personalized instruction on tracking and journaling using Apps and handout on GLP-1 medications.   Recommended Physical Activity Goals  Chesky has been advised to work up to 150 minutes of moderate intensity aerobic activity a week and strengthening exercises 2-3 times per  week for cardiovascular health, weight loss maintenance and preservation of muscle mass.   He has agreed to :  {EMEXERCISE:28847::"Think about ways to increase daily physical activity and overcoming barriers to exercise"}  Pharmacotherapy We discussed various medication options to help  Estuardo with his weight loss efforts and we both agreed to : {EMagreedrx:29170::"continue with nutritional and behavioral strategies"}  ASSOCIATED CONDITIONS ADDRESSED TODAY  Insulin resistance  Other specified hypothyroidism  Essential hypertension    PHYSICAL EXAM:  Blood pressure 130/84, pulse 84, temperature 98.8 F (37.1 C), height 5\' 11"  (1.803 m), weight (!) 379 lb (171.9 kg), SpO2 97%. Body mass index is 52.86 kg/m.  General: He is overweight, cooperative, alert, well developed, and in no acute distress. PSYCH: Has normal mood, affect and thought process.   HEENT: EOMI, sclerae are anicteric. Lungs: Normal breathing effort, no conversational dyspnea. Extremities: No edema.  Neurologic: No gross sensory or motor deficits. No tremors or fasciculations noted.    DIAGNOSTIC DATA REVIEWED:  BMET    Component Value Date/Time   NA 144 02/06/2023 1632   K 3.9 02/06/2023 1632   CL 105 02/06/2023 1632   CO2 22 02/06/2023 1632   GLUCOSE 78 02/06/2023 1632   GLUCOSE 87 03/09/2021 1305   BUN 12 02/06/2023 1632   CREATININE 1.48 (H) 02/06/2023 1632   CREATININE 1.32 08/25/2017 1655   CALCIUM 9.6 02/06/2023 1632   GFRNONAA 72 08/25/2017 1655   GFRAA 84 08/25/2017 1655   Lab Results  Component Value Date   HGBA1C 5.7 (H) 02/06/2023   HGBA1C 5.6 12/31/2013   Lab Results  Component Value Date   INSULIN 27.5 (H) 11/20/2022   Lab Results  Component Value Date   TSH 5.500 (H) 11/20/2022   CBC    Component Value Date/Time   WBC 7.1 11/20/2022 1153   WBC 7.5 08/25/2017 1655   RBC 5.13 11/20/2022 1153   RBC 5.18 08/25/2017 1655   HGB 14.7 11/20/2022 1153   HCT 44.2 11/20/2022 1153   PLT 307 11/20/2022 1153   MCV 86 11/20/2022 1153   MCH 28.7 11/20/2022 1153   MCH 28.2 08/25/2017 1655   MCHC 33.3 11/20/2022 1153   MCHC 33.5 08/25/2017 1655   RDW 13.7 11/20/2022 1153   Iron Studies    Component Value Date/Time   IRON 56 03/14/2016 1056   TIBC 348 03/14/2016  1056   FERRITIN 76 03/14/2016 1056   IRONPCTSAT 16 03/14/2016 1056   Lipid Panel     Component Value Date/Time   CHOL 241 (H) 11/20/2022 1153   TRIG 247 (H) 11/20/2022 1153   HDL 46 11/20/2022 1153   CHOLHDL 5.2 (H) 11/20/2022 1153   CHOLHDL 4.8 05/20/2017 1215   VLDL 31 (H) 02/06/2017 1151   LDLCALC 150 (H) 11/20/2022 1153   LDLCALC 144 (H) 05/20/2017 1215   Hepatic Function Panel     Component Value Date/Time   PROT 6.7 02/06/2023 1632   ALBUMIN 4.5 02/06/2023 1632   AST 20 02/06/2023 1632   ALT 21 02/06/2023 1632   ALKPHOS 54 02/06/2023 1632   BILITOT 0.3 02/06/2023 1632   BILIDIR 0.1 05/20/2017 1215   IBILI 0.5 05/20/2017 1215      Component Value Date/Time   TSH 5.500 (H) 11/20/2022 1153   Nutritional Lab Results  Component Value Date   VD25OH 21.5 (L) 02/06/2023   VD25OH 7.2 (L) 11/20/2022   VD25OH 18 (L) 05/20/2017     No follow-ups on file.Marland Kitchen He was informed of the importance  of frequent follow up visits to maximize his success with intensive lifestyle modifications for his multiple health conditions.   ATTESTASTION STATEMENTS:  Reviewed by clinician on day of visit: allergies, medications, problem list, medical history, surgical history, family history, social history, and previous encounter notes.     Worthy Rancher, MD

## 2023-04-29 NOTE — Telephone Encounter (Signed)
Prior authorization done for patients Ozempic. Patient got new insurance card. Waiting on determination from cover my meds.

## 2023-04-30 MED ORDER — METFORMIN HCL ER 500 MG PO TB24: 500.0000 mg | ORAL_TABLET | Freq: Every day | ORAL | 0 refills | Status: DC

## 2023-04-30 MED ORDER — AMLODIPINE BESYLATE 10 MG PO TABS
10.0000 mg | ORAL_TABLET | Freq: Every day | ORAL | 0 refills | Status: DC
Start: 2023-04-30 — End: 2023-06-26

## 2023-04-30 MED ORDER — LEVOTHYROXINE SODIUM 75 MCG PO TABS: 75.0000 ug | ORAL_TABLET | Freq: Every day | ORAL | 0 refills | Status: DC

## 2023-04-30 NOTE — Assessment & Plan Note (Addendum)
Blood pressure is close to goal.  Currently on amlodipine without any adverse effects.  He will continue medication.  Losing 10% of body weight may improve blood pressure control.

## 2023-04-30 NOTE — Assessment & Plan Note (Signed)
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 Optimal level < 1.9.   This is complex condition associated with genetics, ectopic fat and lifestyle factors. Insulin resistance may also result in weight gain, abnormal cravings (particularly for carbs) and fatigue. This may result in additional weight gain and lead to pre-diabetes and diabetes if untreated. In addition, hyperinsulinemia increases cardiovascular risk, chronic inflammatory response and may increase the risk of obesity related malignancies.  Lab Results  Component Value Date   HGBA1C 5.7 (H) 02/06/2023   Lab Results  Component Value Date   INSULIN 27.5 (H) 11/20/2022   Lab Results  Component Value Date   GLUCOSE 78 02/06/2023   GLUCOSE 99 12/31/2013    Losing 10% of body weight may improve condition.  He benefits from reducing simple and processed carbs in diet.  We will increase his metformin 500 mg XR to twice daily for added incretin effect and pharmacoprophylaxis.  Patient also benefits from GLP-1 therapy.

## 2023-04-30 NOTE — Assessment & Plan Note (Signed)
Most recent TSH is 5.  He is currently on thyroid replacement therapy.  Patient is new to me and will be transitioning from Dr. Cathey Endow.  I will review further his history of hypothyroidism and repeat TSH at the 68-month mark.

## 2023-05-07 NOTE — Telephone Encounter (Signed)
Per Cover My Meds: Why was my request denied? This request was denied because you did not meet the following requirements: Based on the information provided, you do not meet the established medication-specific criteria or guidelines for Ozempic at this time. The request for coverage for OZEMPIC INJ 2MG /3ML, use as directed (3ml per month), is denied. This decision is based on health plan criteria for OZEMPIC INJ 2MG /3ML. This medicine is covered only if: One of the following: (1) Your doctor submits medical records (for example, chart notes) confirming diagnosis of type 2 diabetes mellitus as evidenced by one of the following laboratory values: (A) A1C greater than or equal to 6.5%. (B) Fasting plasma glucose greater than or equal to 126mg /dL. (C) 2-hour plasma glucose greater than or equal to 200mg /dL during oral glucose tolerance test. (D) Random plasma glucose greater than or equal to 200mg /dL with classic symptoms of hyperglycemia or hyperglycemic crisis. (2) If you require ongoing treatment for type 2 diabetes mellitus (that is, diagnosed greater than 2 years ago), your doctor submits medical records (for example, chart notes) confirming diagnosis of type 2 diabetes mellitus. The information provided does not show that you meet the criteria listed above.

## 2023-05-12 ENCOUNTER — Other Ambulatory Visit (INDEPENDENT_AMBULATORY_CARE_PROVIDER_SITE_OTHER): Payer: Self-pay | Admitting: Family Medicine

## 2023-05-12 DIAGNOSIS — E538 Deficiency of other specified B group vitamins: Secondary | ICD-10-CM

## 2023-05-27 ENCOUNTER — Ambulatory Visit (INDEPENDENT_AMBULATORY_CARE_PROVIDER_SITE_OTHER): Payer: 59 | Admitting: Internal Medicine

## 2023-05-27 ENCOUNTER — Encounter (INDEPENDENT_AMBULATORY_CARE_PROVIDER_SITE_OTHER): Payer: Self-pay | Admitting: Internal Medicine

## 2023-05-27 DIAGNOSIS — Z6841 Body Mass Index (BMI) 40.0 and over, adult: Secondary | ICD-10-CM

## 2023-05-27 DIAGNOSIS — G932 Benign intracranial hypertension: Secondary | ICD-10-CM | POA: Diagnosis not present

## 2023-05-27 DIAGNOSIS — R638 Other symptoms and signs concerning food and fluid intake: Secondary | ICD-10-CM

## 2023-05-27 DIAGNOSIS — R7303 Prediabetes: Secondary | ICD-10-CM | POA: Diagnosis not present

## 2023-05-27 MED ORDER — SEMAGLUTIDE-WEIGHT MANAGEMENT 0.25 MG/0.5ML ~~LOC~~ SOAJ
0.2500 mg | SUBCUTANEOUS | 0 refills | Status: AC
Start: 2023-05-27 — End: 2023-06-24

## 2023-05-27 NOTE — Progress Notes (Unsigned)
Office: [phone]  /  Fax: [phone]  WEIGHT SUMMARY AND BIOMETRICS  Vitals Temp: 98.5 F (36.9 C) BP: 138/84 Pulse Rate: 67 SpO2: 96 %   Anthropometric Measurements Height: 5\' 11"  (1.803 m) Weight: (!) 379 lb (171.9 kg) BMI (Calculated): 52.88 Weight at Last Visit: 379 lb Weight Lost Since Last Visit: 0 lb Weight Gained Since Last Visit: 0 lb Starting Weight: 380 lb Total Weight Loss (lbs): 6 lb (2.722 kg)   Body Composition  Body Fat %: 45.2 % Fat Mass (lbs): 171.4 lbs Muscle Mass (lbs): 197.8 lbs Visceral Fat Rating : 31    No data recorded Today's Visit #: 8  Starting Date: 11/20/22   HPI  Chief Complaint: OBESITY  David Tanner is here to discuss his progress with his obesity treatment plan. He is journaling 2500 calories and 30-40 grams of protein per meal and states he is following his eating plan approximately 75-80 % of the time. He states he is exercising 60-90 minutes 5 times per week.  Interval History:  This is my 2nd encounter with David Tanner.  He is a pleasant 37 year old male affected by obesity.  Since last office visit he has maintained.  He is not following a structured meal plan is working on portion control and making healthy choices.  He is not tracking or journaling.  He is transitioning his care over to me.  Previous provider had prescribed semaglutide in the form of Ozempic and medication was declined.  Patient is highly interested in pharmacotherapy and would like to discuss options today.  He has new insurance and is under the impression that they cover Wegovy.  He has symptoms suspicious for sleep apnea but has not had a sleep study.  Orixegenic Control: Denies problems with appetite and hunger signals.  Denies problems with satiety and satiation.  Denies problems with eating patterns and portion control.  Denies abnormal cravings. Denies feeling deprived or restricted.   Barriers identified: strong hunger signals and appetite,  having difficulty with meal prep and planning, having difficulty focusing on healthy eating, difficulty implementing reduced calorie nutrition plan, and difficulty maintaining a reduced calorie state.   Pharmacotherapy for weight loss: He is currently taking no anti-obesity medication.    ASSESSMENT AND PLAN  TREATMENT PLAN FOR OBESITY:  Recommended Dietary Goals  David Tanner is currently in the action stage of change. As such, his goal is to continue weight management plan. He has agreed to: portion control, balanced plate and making smarter food choices, such as increasing vegetables, protein intake and reducing simple carbohydrates and processed foods  and he would benefit from tracking and journaling but is not interested.  We did review how to read food labels and make sense of the percentages.  Behavioral Intervention  We discussed the following Behavioral Modification Strategies today: increasing lean protein intake, decreasing simple carbohydrates , increasing vegetables, increasing lower glycemic fruits, increasing fiber rich foods, increasing water intake, work on meal planning and preparation, reading food labels , keeping healthy foods at home, and planning for success.  Additional resources provided today: None  Recommended Physical Activity Goals  David Tanner has been advised to work up to 150 minutes of moderate intensity aerobic activity a week and strengthening exercises 2-3 times per week for cardiovascular health, weight loss maintenance and preservation of muscle mass.   He has agreed to :  Continue current level of physical activity   Pharmacotherapy We discussed various medication options to help David Tanner with his weight loss efforts and we  both agreed to : start anti-obesity medication.  In addition to reduced calorie nutrition plan (RCNP), behavioral strategies and physical activity, David Tanner would benefit from pharmacotherapy to assist with hunger signals, satiety and  cravings. This will reduce obesity-related health risks by inducing weight loss, and help reduce food consumption and adherence to David Tanner) . It may also improve QOL by improving self-confidence and reduce the  setbacks associated with metabolic adaptations.  After discussion of treatment options, mechanisms of action, benefits, side effects, contraindications and shared decision making he is agreeable to starting Wegovy 0.25 mg once a week. Patient also made aware that medication is indicated for long-term management of obesity and the risk of weight regain following discontinuation of treatment and hence the importance of adhering to medical weight loss plan.  We demonstrated use of device and patient using teach back method was able to demonstrate proper technique.  ASSOCIATED CONDITIONS ADDRESSED TODAY  Morbid obesity (HCC) Assessment & Plan: See obesity treatment  Orders: -     Semaglutide-Weight Management; Inject 0.25 mg into the skin once a week for 28 days.  Dispense: 2 mL; Refill: 0  Pre-diabetes Assessment & Plan: Most recent A1c is  Lab Results  Component Value Date   HGBA1C 5.7 (H) 02/06/2023   HGBA1C 5.6 12/31/2013   Currently on metformin 500 mg XR 1 tablet daily for pharmacoprophylaxis.  No adverse effects reported  Patient aware of disease state and risk of progression. This may contribute to abnormal cravings, fatigue and diabetic complications without having diabetes.   We have discussed treatment options which include: losing 7 to 10% of body weight, increasing physical activity to a goal of 150 minutes a week at moderate intensity.  Advised to maintain a diet low on simple and processed carbohydrates.  Also benefits from incretin therapy.  After discussion of benefits and side effects he will be started on Wegovy.   Orders: -     Semaglutide-Weight Management; Inject 0.25 mg into the skin once a week for 28 days.  Dispense: 2 mL; Refill: 0  Benign intracranial  hypertension Assessment & Plan: Symptomatic.  Patient currently on topiramate.  He is followed by neurology.  He will benefit from losing 15% of body weight.  Incretin therapy would help.  Orders: -     Semaglutide-Weight Management; Inject 0.25 mg into the skin once a week for 28 days.  Dispense: 2 mL; Refill: 0  Abnormal food appetite Assessment & Plan: He has increased orixegenic signaling, impaired satiety and inhibitory control. This is secondary to an abnormal energy regulation system and pathological neurohormonal pathways characteristic of excess adiposity.  In addition to nutritional and behavioral strategies he benefits from pharmacotherapy.       PHYSICAL EXAM:  Blood pressure 138/84, pulse 67, temperature 98.5 F (36.9 C), height 5\' 11"  (1.803 m), weight (!) 379 lb (171.9 kg), SpO2 96%. Body mass index is 52.86 kg/m.  General: He is overweight, cooperative, alert, well developed, and in no acute distress. PSYCH: Has normal mood, affect and thought process.   HEENT: EOMI, sclerae are anicteric. Lungs: Normal breathing effort, no conversational dyspnea. Extremities: No edema.  Neurologic: No gross sensory or motor deficits. No tremors or fasciculations noted.    DIAGNOSTIC DATA REVIEWED:  BMET    Component Value Date/Time   NA 144 02/06/2023 1632   K 3.9 02/06/2023 1632   CL 105 02/06/2023 1632   CO2 22 02/06/2023 1632   GLUCOSE 78 02/06/2023 1632   GLUCOSE 87 03/09/2021 1305  BUN 12 02/06/2023 1632   CREATININE 1.48 (H) 02/06/2023 1632   CREATININE 1.32 08/25/2017 1655   CALCIUM 9.6 02/06/2023 1632   GFRNONAA 72 08/25/2017 1655   GFRAA 84 08/25/2017 1655   Lab Results  Component Value Date   HGBA1C 5.7 (H) 02/06/2023   HGBA1C 5.6 12/31/2013   Lab Results  Component Value Date   INSULIN 27.5 (H) 11/20/2022   Lab Results  Component Value Date   TSH 5.500 (H) 11/20/2022   CBC    Component Value Date/Time   WBC 7.1 11/20/2022 1153   WBC 7.5  08/25/2017 1655   RBC 5.13 11/20/2022 1153   RBC 5.18 08/25/2017 1655   HGB 14.7 11/20/2022 1153   HCT 44.2 11/20/2022 1153   PLT 307 11/20/2022 1153   MCV 86 11/20/2022 1153   MCH 28.7 11/20/2022 1153   MCH 28.2 08/25/2017 1655   MCHC 33.3 11/20/2022 1153   MCHC 33.5 08/25/2017 1655   RDW 13.7 11/20/2022 1153   Iron Studies    Component Value Date/Time   IRON 56 03/14/2016 1056   TIBC 348 03/14/2016 1056   FERRITIN 76 03/14/2016 1056   IRONPCTSAT 16 03/14/2016 1056   Lipid Panel     Component Value Date/Time   CHOL 241 (H) 11/20/2022 1153   TRIG 247 (H) 11/20/2022 1153   HDL 46 11/20/2022 1153   CHOLHDL 5.2 (H) 11/20/2022 1153   CHOLHDL 4.8 05/20/2017 1215   VLDL 31 (H) 02/06/2017 1151   LDLCALC 150 (H) 11/20/2022 1153   LDLCALC 144 (H) 05/20/2017 1215   Hepatic Function Panel     Component Value Date/Time   PROT 6.7 02/06/2023 1632   ALBUMIN 4.5 02/06/2023 1632   AST 20 02/06/2023 1632   ALT 21 02/06/2023 1632   ALKPHOS 54 02/06/2023 1632   BILITOT 0.3 02/06/2023 1632   BILIDIR 0.1 05/20/2017 1215   IBILI 0.5 05/20/2017 1215      Component Value Date/Time   TSH 5.500 (H) 11/20/2022 1153   Nutritional Lab Results  Component Value Date   VD25OH 21.5 (L) 02/06/2023   VD25OH 7.2 (L) 11/20/2022   VD25OH 18 (L) 05/20/2017     Return in about 3 weeks (around 06/17/2023) for David Tanner for med adjustment.Marland Kitchen He was informed of the importance of frequent follow up visits to maximize his success with intensive lifestyle modifications for his multiple health conditions.   ATTESTASTION STATEMENTS:  Reviewed by clinician on day of visit: allergies, medications, problem list, medical history, surgical history, family history, social history, and previous encounter notes.     Worthy Rancher, MD

## 2023-05-28 DIAGNOSIS — R638 Other symptoms and signs concerning food and fluid intake: Secondary | ICD-10-CM | POA: Insufficient documentation

## 2023-05-28 NOTE — Assessment & Plan Note (Signed)
See obesity treatment

## 2023-05-28 NOTE — Assessment & Plan Note (Signed)
Most recent A1c is  Lab Results  Component Value Date   HGBA1C 5.7 (H) 02/06/2023   HGBA1C 5.6 12/31/2013   Currently on metformin 500 mg XR 1 tablet daily for pharmacoprophylaxis.  No adverse effects reported  Patient aware of disease state and risk of progression. This may contribute to abnormal cravings, fatigue and diabetic complications without having diabetes.   We have discussed treatment options which include: losing 7 to 10% of body weight, increasing physical activity to a goal of 150 minutes a week at moderate intensity.  Advised to maintain a diet low on simple and processed carbohydrates.  Also benefits from incretin therapy.  After discussion of benefits and side effects he will be started on Wegovy.

## 2023-05-28 NOTE — Assessment & Plan Note (Signed)
He has increased orixegenic signaling, impaired satiety and inhibitory control. This is secondary to an abnormal energy regulation system and pathological neurohormonal pathways characteristic of excess adiposity.  In addition to nutritional and behavioral strategies he benefits from pharmacotherapy.

## 2023-05-28 NOTE — Assessment & Plan Note (Signed)
Symptomatic.  Patient currently on topiramate.  He is followed by neurology.  He will benefit from losing 15% of body weight.  Incretin therapy would help.

## 2023-06-26 ENCOUNTER — Encounter (INDEPENDENT_AMBULATORY_CARE_PROVIDER_SITE_OTHER): Payer: Self-pay | Admitting: Family Medicine

## 2023-06-26 ENCOUNTER — Ambulatory Visit (INDEPENDENT_AMBULATORY_CARE_PROVIDER_SITE_OTHER): Payer: 59 | Admitting: Family Medicine

## 2023-06-26 VITALS — BP 138/89 | HR 80 | Temp 99.4°F | Ht 71.0 in | Wt 372.0 lb

## 2023-06-26 DIAGNOSIS — E039 Hypothyroidism, unspecified: Secondary | ICD-10-CM | POA: Diagnosis not present

## 2023-06-26 DIAGNOSIS — I10 Essential (primary) hypertension: Secondary | ICD-10-CM | POA: Diagnosis not present

## 2023-06-26 DIAGNOSIS — E038 Other specified hypothyroidism: Secondary | ICD-10-CM

## 2023-06-26 DIAGNOSIS — F5089 Other specified eating disorder: Secondary | ICD-10-CM

## 2023-06-26 DIAGNOSIS — E538 Deficiency of other specified B group vitamins: Secondary | ICD-10-CM | POA: Diagnosis not present

## 2023-06-26 DIAGNOSIS — R7303 Prediabetes: Secondary | ICD-10-CM

## 2023-06-26 DIAGNOSIS — Z6841 Body Mass Index (BMI) 40.0 and over, adult: Secondary | ICD-10-CM

## 2023-06-26 DIAGNOSIS — E669 Obesity, unspecified: Secondary | ICD-10-CM

## 2023-06-26 DIAGNOSIS — E559 Vitamin D deficiency, unspecified: Secondary | ICD-10-CM

## 2023-06-26 MED ORDER — VITAMIN D (ERGOCALCIFEROL) 1.25 MG (50000 UNIT) PO CAPS
50000.0000 [IU] | ORAL_CAPSULE | ORAL | 0 refills | Status: DC
Start: 2023-06-26 — End: 2023-06-26

## 2023-06-26 MED ORDER — AMLODIPINE BESYLATE 10 MG PO TABS
10.0000 mg | ORAL_TABLET | Freq: Every day | ORAL | 0 refills | Status: DC
Start: 2023-06-26 — End: 2023-06-26

## 2023-06-26 MED ORDER — LEVOTHYROXINE SODIUM 75 MCG PO TABS
75.0000 ug | ORAL_TABLET | Freq: Every day | ORAL | 0 refills | Status: DC
Start: 2023-06-26 — End: 2023-07-21

## 2023-06-26 MED ORDER — CYANOCOBALAMIN 1000 MCG/ML IJ SOLN
1000.0000 ug | INTRAMUSCULAR | 0 refills | Status: DC
Start: 2023-06-26 — End: 2023-08-26

## 2023-06-26 MED ORDER — WEGOVY 0.25 MG/0.5ML ~~LOC~~ SOAJ
0.2500 mg | SUBCUTANEOUS | 0 refills | Status: DC
Start: 2023-06-26 — End: 2023-07-22

## 2023-06-26 MED ORDER — LEVOTHYROXINE SODIUM 75 MCG PO TABS
75.0000 ug | ORAL_TABLET | Freq: Every day | ORAL | 0 refills | Status: DC
Start: 2023-06-26 — End: 2023-06-26

## 2023-06-26 MED ORDER — METFORMIN HCL ER 500 MG PO TB24
500.0000 mg | ORAL_TABLET | Freq: Every day | ORAL | 0 refills | Status: DC
Start: 2023-06-26 — End: 2023-08-26

## 2023-06-26 MED ORDER — METFORMIN HCL ER 500 MG PO TB24
500.0000 mg | ORAL_TABLET | Freq: Every day | ORAL | 0 refills | Status: DC
Start: 2023-06-26 — End: 2023-06-26

## 2023-06-26 MED ORDER — CYANOCOBALAMIN 1000 MCG/ML IJ SOLN
1000.0000 ug | INTRAMUSCULAR | 0 refills | Status: DC
Start: 2023-06-26 — End: 2023-06-26

## 2023-06-26 MED ORDER — AMLODIPINE BESYLATE 10 MG PO TABS
10.0000 mg | ORAL_TABLET | Freq: Every day | ORAL | 0 refills | Status: DC
Start: 2023-06-26 — End: 2023-07-21

## 2023-06-26 MED ORDER — VITAMIN D (ERGOCALCIFEROL) 1.25 MG (50000 UNIT) PO CAPS: 50000.0000 [IU] | ORAL_CAPSULE | ORAL | 0 refills | Status: DC

## 2023-06-26 NOTE — Progress Notes (Signed)
.smr  Office: [phone]  /  Fax: [phone]  WEIGHT SUMMARY AND BIOMETRICS  Anthropometric Measurements Height: 5\' 11"  (1.803 m) Weight: (!) 372 lb (168.7 kg) BMI (Calculated): 51.91 Weight at Last Visit: 376 lb   Body Composition  Body Fat %: 44.5 % Fat Mass (lbs): 165.8 lbs Muscle Mass (lbs): 197 lbs Total Body Water (lbs): 168.6 lbs Visceral Fat Rating : 30   Other Clinical Data Fasting: no Labs: no Today's Visit #: 9 Starting Date: 11/20/22    Chief Complaint: OBESITY   History of Present Illness   The patient, with a history of obesity, vitamin D deficiency, hypothyroidism, hypertension, prediabetes, and depression with emotional eating behaviors, presents for a follow-up visit. He reports a weight loss of seven pounds over the past four weeks, attributing this to adherence to a high protein diet and regular exercise, including treadmill and sled workouts. Despite these efforts, the patient expresses frustration with the pace of weight loss, noting the physical demands of his job involving heavy lifting and moving.  The patient also reports experiencing unusual headaches, described as a strange sensation in the back of the head. He is currently on Topamax, a medication typically used to reduce headaches, but also prescribed in this case for its effects on reducing cravings and emotional eating behaviors.  The patient was previously considered for weight loss medication, but insurance denied coverage for Ozipic. Another medication, Reginal Lutes, was ordered but there is no record of approval or denial from the insurance company.  The patient's blood pressure was slightly elevated at 138/89, and his hemoglobin A1c has decreased from 5.9 to 5.7 since his last visit. He is currently on amlodipine, B12, levothyroxine, metformin, and vitamin D. The patient's vitamin D and B12 levels were notably low, potentially contributing to feelings of fatigue and sluggishness.  The patient  is committed to his current low carb, high protein diet and exercise regimen, and does not express a desire for any changes to his current plan.          PHYSICAL EXAM:  Blood pressure 138/89, pulse 80, temperature 99.4 F (37.4 C), height 5\' 11"  (1.803 m), weight (!) 372 lb (168.7 kg), SpO2 96%. Body mass index is 51.88 kg/m.  DIAGNOSTIC DATA REVIEWED:  BMET    Component Value Date/Time   NA 144 02/06/2023 1632   K 3.9 02/06/2023 1632   CL 105 02/06/2023 1632   CO2 22 02/06/2023 1632   GLUCOSE 78 02/06/2023 1632   GLUCOSE 87 03/09/2021 1305   BUN 12 02/06/2023 1632   CREATININE 1.48 (H) 02/06/2023 1632   CREATININE 1.32 08/25/2017 1655   CALCIUM 9.6 02/06/2023 1632   GFRNONAA 72 08/25/2017 1655   GFRAA 84 08/25/2017 1655   Lab Results  Component Value Date   HGBA1C 5.7 (H) 02/06/2023   HGBA1C 5.6 12/31/2013   Lab Results  Component Value Date   INSULIN 27.5 (H) 11/20/2022   Lab Results  Component Value Date   TSH 5.500 (H) 11/20/2022   CBC    Component Value Date/Time   WBC 7.1 11/20/2022 1153   WBC 7.5 08/25/2017 1655   RBC 5.13 11/20/2022 1153   RBC 5.18 08/25/2017 1655   HGB 14.7 11/20/2022 1153   HCT 44.2 11/20/2022 1153   PLT 307 11/20/2022 1153   MCV 86 11/20/2022 1153   MCH 28.7 11/20/2022 1153   MCH 28.2 08/25/2017 1655   MCHC 33.3 11/20/2022 1153   MCHC 33.5 08/25/2017 1655   RDW 13.7 11/20/2022  1153   Iron Studies    Component Value Date/Time   IRON 56 03/14/2016 1056   TIBC 348 03/14/2016 1056   FERRITIN 76 03/14/2016 1056   IRONPCTSAT 16 03/14/2016 1056   Lipid Panel     Component Value Date/Time   CHOL 241 (H) 11/20/2022 1153   TRIG 247 (H) 11/20/2022 1153   HDL 46 11/20/2022 1153   CHOLHDL 5.2 (H) 11/20/2022 1153   CHOLHDL 4.8 05/20/2017 1215   VLDL 31 (H) 02/06/2017 1151   LDLCALC 150 (H) 11/20/2022 1153   LDLCALC 144 (H) 05/20/2017 1215   Hepatic Function Panel     Component Value Date/Time   PROT 6.7 02/06/2023 1632    ALBUMIN 4.5 02/06/2023 1632   AST 20 02/06/2023 1632   ALT 21 02/06/2023 1632   ALKPHOS 54 02/06/2023 1632   BILITOT 0.3 02/06/2023 1632   BILIDIR 0.1 05/20/2017 1215   IBILI 0.5 05/20/2017 1215      Component Value Date/Time   TSH 5.500 (H) 11/20/2022 1153   Nutritional Lab Results  Component Value Date   VD25OH 21.5 (L) 02/06/2023   VD25OH 7.2 (L) 11/20/2022   VD25OH 18 (L) 05/20/2017     Assessment and Plan    Obesity Patient has lost 7 pounds in the past 4 weeks. He is following a high protein diet and exercising 2-3 times per week. He has been experiencing difficulty with weight loss despite these efforts. Insurance denied coverage for Ozipic and there is no update on the status of Wegovy. -Resubmit prescription for [facility] for weight loss and prediabetes management. -Patient to contact pharmacy to confirm receipt and need for prior authorization. -Continue high protein diet and regular exercise.  Prediabetes Patient's HbA1c has decreased from 5.9 to 5.7 as of May. He is currently on Metformin with no reported side effects. -Continue Metformin. -Consider addition of Wegovy pending insurance approval.  Hypertension Blood pressure today was 138/89, which is borderline controlled. -Continue current management and monitor.  Hypothyroidism Last TSH was not quite at goal, but T4 was within normal range. Patient is currently on Levothyroxine. -Continue Levothyroxine. -Primary care doctor to reassess thyroid function at next visit.  Vitamin D Deficiency Patient's Vitamin D levels were previously low. -Continue Vitamin D supplementation.  Depression with Emotional Eating Behaviors Patient is currently on Topiramate (Topamax) 100mg  twice daily. He has been experiencing headaches, which may be due to dehydration. -Continue Topiramate. -Advise patient to increase hydration to potentially alleviate headaches.  Follow-up in 3-4 weeks.        He was informed of the  importance of frequent follow up visits to maximize his success with intensive lifestyle modifications for his multiple health conditions.    Quillian Quince, MD

## 2023-07-21 ENCOUNTER — Encounter (HOSPITAL_BASED_OUTPATIENT_CLINIC_OR_DEPARTMENT_OTHER): Payer: Self-pay | Admitting: Family Medicine

## 2023-07-21 ENCOUNTER — Ambulatory Visit (INDEPENDENT_AMBULATORY_CARE_PROVIDER_SITE_OTHER): Payer: 59 | Admitting: Family Medicine

## 2023-07-21 VITALS — BP 144/105 | HR 62 | Ht 72.0 in | Wt 380.0 lb

## 2023-07-21 DIAGNOSIS — I10 Essential (primary) hypertension: Secondary | ICD-10-CM | POA: Diagnosis not present

## 2023-07-21 DIAGNOSIS — E038 Other specified hypothyroidism: Secondary | ICD-10-CM

## 2023-07-21 DIAGNOSIS — Z0000 Encounter for general adult medical examination without abnormal findings: Secondary | ICD-10-CM | POA: Insufficient documentation

## 2023-07-21 MED ORDER — LEVOTHYROXINE SODIUM 75 MCG PO TABS
75.0000 ug | ORAL_TABLET | Freq: Every day | ORAL | 0 refills | Status: DC
Start: 2023-07-21 — End: 2023-07-31

## 2023-07-21 MED ORDER — AMLODIPINE BESYLATE 10 MG PO TABS: 10.0000 mg | ORAL_TABLET | Freq: Every day | ORAL | 0 refills | Status: DC

## 2023-07-21 NOTE — Assessment & Plan Note (Signed)
Routine HCM labs ordered. HCM reviewed/discussed. Anticipatory guidance regarding healthy weight, lifestyle and choices given. Recommend healthy diet.  Recommend approximately 150 minutes/week of moderate intensity exercise Recommend regular dental and vision exams Always use seatbelt/lap and shoulder restraints Recommend using smoke alarms and checking batteries at least twice a year Recommend using sunscreen when outside Discussed tetanus immunization recommendations, patient UTD

## 2023-07-21 NOTE — Progress Notes (Signed)
Subjective:    CC: Annual Physical Exam  HPI: David Tanner is a 37 y.o. presenting for annual physical  I reviewed the past medical history, family history, social history, surgical history, and allergies today and no changes were needed.  Please see the problem list section below in epic for further details.  Past Medical History: Past Medical History:  Diagnosis Date   DVT (deep venous thrombosis) (HCC)    Left leg   Hyperlipidemia    Hypothyroid    Vitamin D deficiency    Past Surgical History: History reviewed. No pertinent surgical history. Social History: Social History   Socioeconomic History   Marital status: Single    Spouse name: Barista   Number of children: Not on file   Years of education: Not on file   Highest education level: Not on file  Occupational History   Occupation: Delivery Driver  Tobacco Use   Smoking status: Former    Current packs/day: 0.25    Average packs/day: 0.3 packs/day for 1 year (0.3 ttl pk-yrs)    Types: Cigarettes   Smokeless tobacco: Never   Tobacco comments:    Patient states he quit smoking about 2 years ago (approx 2020)  Vaping Use   Vaping status: Never Used  Substance and Sexual Activity   Alcohol use: No    Alcohol/week: 0.0 standard drinks of alcohol   Drug use: No   Sexual activity: Yes    Comment: With girlfriend for 4 years  Other Topics Concern   Not on file  Social History Narrative   Lives with wife and 3 kids   Right Handed   Drinks 6-7 cups caffeine   Social Determinants of Health   Financial Resource Strain: Not on file  Food Insecurity: No Food Insecurity (10/10/2021)   Hunger Vital Sign    Worried About Running Out of Food in the Last Year: Never true    Ran Out of Food in the Last Year: Never true  Transportation Needs: Not on file  Physical Activity: Not on file  Stress: Not on file  Social Connections: Not on file   Family History: Family History  Problem Relation Age of Onset    Hypertension Mother    Diabetes Father    Cancer Maternal Grandfather        leukemia   Allergies: No Known Allergies Medications: See med rec.  Review of Systems: No headache, visual changes, nausea, vomiting, diarrhea, constipation, dizziness, abdominal pain, skin rash, fevers, chills, night sweats, swollen lymph nodes, weight loss, chest pain, body aches, joint swelling, muscle aches, shortness of breath, mood changes, visual or auditory hallucinations.  Objective:    BP (!) 144/105 (BP Location: Right Arm, Patient Position: Sitting, Cuff Size: Normal)   Pulse 62   Ht 6' (1.829 m)   Wt (!) 380 lb (172.4 kg)   SpO2 98%   BMI 51.54 kg/m   General: Well Developed, well nourished, and in no acute distress.  Neuro: Alert and oriented x3, extra-ocular muscles intact, sensation grossly intact. Cranial nerves II through XII are intact, motor, sensory, and coordinative functions are all intact. HEENT: Normocephalic, atraumatic, pupils equal round reactive to light, neck supple, no masses, no lymphadenopathy, thyroid nonpalpable. Oropharynx, nasopharynx, external ear canals are unremarkable. Skin: Warm and dry, no rashes noted.  Cardiac: Regular rate and rhythm, no murmurs rubs or gallops.  Respiratory: Clear to auscultation bilaterally. Not using accessory muscles, speaking in full sentences.  Abdominal: Soft, nontender, nondistended, positive bowel sounds,  no masses, no organomegaly.  Musculoskeletal: Shoulder, elbow, wrist, hip, knee, ankle stable, and with full range of motion.  Impression and Recommendations:    Wellness examination Assessment & Plan: Routine HCM labs ordered. HCM reviewed/discussed. Anticipatory guidance regarding healthy weight, lifestyle and choices given. Recommend healthy diet.  Recommend approximately 150 minutes/week of moderate intensity exercise Recommend regular dental and vision exams Always use seatbelt/lap and shoulder restraints Recommend using smoke  alarms and checking batteries at least twice a year Recommend using sunscreen when outside Discussed tetanus immunization recommendations, patient UTD  Orders: -     CBC with Differential/Platelet -     Comprehensive metabolic panel -     Hemoglobin A1c -     Lipid panel -     TSH  Essential hypertension -     amLODIPine Besylate; Take 1 tablet (10 mg total) by mouth daily.  Dispense: 90 tablet; Refill: 0  Other specified hypothyroidism -     Levothyroxine Sodium; Take 1 tablet (75 mcg total) by mouth daily.  Dispense: 90 tablet; Refill: 0  Return in about 6 weeks (around 09/01/2023) for hypertension.   ___________________________________________ Aamiyah Derrick de [place], MD, ABFM, CAQSM Primary Care and Sports Medicine [facility]

## 2023-07-22 ENCOUNTER — Encounter (INDEPENDENT_AMBULATORY_CARE_PROVIDER_SITE_OTHER): Payer: Self-pay | Admitting: Family Medicine

## 2023-07-22 ENCOUNTER — Ambulatory Visit (INDEPENDENT_AMBULATORY_CARE_PROVIDER_SITE_OTHER): Payer: 59 | Admitting: Family Medicine

## 2023-07-22 VITALS — BP 145/71 | HR 76 | Temp 98.2°F | Ht 71.0 in | Wt 378.0 lb

## 2023-07-22 DIAGNOSIS — I10 Essential (primary) hypertension: Secondary | ICD-10-CM | POA: Diagnosis not present

## 2023-07-22 DIAGNOSIS — R3589 Other polyuria: Secondary | ICD-10-CM

## 2023-07-22 DIAGNOSIS — E669 Obesity, unspecified: Secondary | ICD-10-CM

## 2023-07-22 DIAGNOSIS — E559 Vitamin D deficiency, unspecified: Secondary | ICD-10-CM

## 2023-07-22 DIAGNOSIS — Z6841 Body Mass Index (BMI) 40.0 and over, adult: Secondary | ICD-10-CM

## 2023-07-22 LAB — CBC WITH DIFFERENTIAL/PLATELET
Basophils Absolute: 0 10*3/uL (ref 0.0–0.2)
Basos: 1 %
EOS (ABSOLUTE): 0.3 10*3/uL (ref 0.0–0.4)
Eos: 4 %
Hematocrit: 46.6 % (ref 37.5–51.0)
Hemoglobin: 15 g/dL (ref 13.0–17.7)
Immature Grans (Abs): 0 10*3/uL (ref 0.0–0.1)
Immature Granulocytes: 1 %
Lymphocytes Absolute: 3.6 10*3/uL — ABNORMAL HIGH (ref 0.7–3.1)
Lymphs: 47 %
MCH: 28.9 pg (ref 26.6–33.0)
MCHC: 32.2 g/dL (ref 31.5–35.7)
MCV: 90 fL (ref 79–97)
Monocytes Absolute: 0.5 10*3/uL (ref 0.1–0.9)
Monocytes: 7 %
Neutrophils Absolute: 2.9 10*3/uL (ref 1.4–7.0)
Neutrophils: 40 %
Platelets: 309 10*3/uL (ref 150–450)
RBC: 5.19 x10E6/uL (ref 4.14–5.80)
RDW: 14.1 % (ref 11.6–15.4)
WBC: 7.3 10*3/uL (ref 3.4–10.8)

## 2023-07-22 LAB — COMPREHENSIVE METABOLIC PANEL
ALT: 21 [IU]/L (ref 0–44)
AST: 19 [IU]/L (ref 0–40)
Albumin: 4.3 g/dL (ref 4.1–5.1)
Alkaline Phosphatase: 53 [IU]/L (ref 44–121)
BUN/Creatinine Ratio: 5 — ABNORMAL LOW (ref 9–20)
BUN: 7 mg/dL (ref 6–20)
Bilirubin Total: 0.2 mg/dL (ref 0.0–1.2)
CO2: 25 mmol/L (ref 20–29)
Calcium: 9.6 mg/dL (ref 8.7–10.2)
Chloride: 104 mmol/L (ref 96–106)
Creatinine, Ser: 1.33 mg/dL — ABNORMAL HIGH (ref 0.76–1.27)
Globulin, Total: 2.2 g/dL (ref 1.5–4.5)
Glucose: 92 mg/dL (ref 70–99)
Potassium: 4 mmol/L (ref 3.5–5.2)
Sodium: 141 mmol/L (ref 134–144)
Total Protein: 6.5 g/dL (ref 6.0–8.5)
eGFR: 71 mL/min/{1.73_m2} (ref >59)

## 2023-07-22 LAB — HEMOGLOBIN A1C
Est. average glucose Bld gHb Est-mCnc: 120 mg/dL
Hgb A1c MFr Bld: 5.8 % — ABNORMAL HIGH (ref 4.8–5.6)

## 2023-07-22 LAB — LIPID PANEL
Chol/HDL Ratio: 5.6 ratio — ABNORMAL HIGH (ref 0.0–5.0)
Cholesterol, Total: 247 mg/dL — ABNORMAL HIGH (ref 100–199)
HDL: 44 mg/dL (ref >39)
LDL Chol Calc (NIH): 165 mg/dL — ABNORMAL HIGH (ref 0–99)
Triglycerides: 203 mg/dL — ABNORMAL HIGH (ref 0–149)
VLDL Cholesterol Cal: 38 mg/dL (ref 5–40)

## 2023-07-22 LAB — TSH: TSH: 4.76 u[IU]/mL — ABNORMAL HIGH (ref 0.450–4.500)

## 2023-07-22 MED ORDER — WEGOVY 0.25 MG/0.5ML ~~LOC~~ SOAJ
0.2500 mg | SUBCUTANEOUS | 0 refills | Status: DC
Start: 2023-07-22 — End: 2023-08-26

## 2023-07-22 MED ORDER — VITAMIN D (ERGOCALCIFEROL) 1.25 MG (50000 UNIT) PO CAPS
50000.0000 [IU] | ORAL_CAPSULE | ORAL | 0 refills | Status: DC
Start: 2023-07-24 — End: 2023-08-26

## 2023-07-22 MED ORDER — CHLORTHALIDONE 25 MG PO TABS: 25.0000 mg | ORAL_TABLET | Freq: Every day | ORAL | 0 refills | Status: DC

## 2023-07-22 NOTE — Progress Notes (Signed)
.smr  Office: [phone]  /  Fax: [phone]  WEIGHT SUMMARY AND BIOMETRICS  Anthropometric Measurements Height: 5\' 11"  (1.803 m) Weight: (!) 378 lb (171.5 kg) BMI (Calculated): 52.74 Weight at Last Visit: 372 lb Weight Lost Since Last Visit: 0 Weight Gained Since Last Visit: 6 lb Starting Weight: 380 lb Total Weight Loss (lbs): 2 lb (0.907 kg)   Body Composition  Body Fat %: 44.7 % Fat Mass (lbs): 169 lbs Muscle Mass (lbs): 198.8 lbs Total Body Water (lbs): 168.2 lbs Visceral Fat Rating : 31   Other Clinical Data Fasting: No Labs: No Today's Visit #: 10 Starting Date: 11/20/22    Chief Complaint: OBESITY   History of Present Illness   The patient, with a history of obesity, polyphagia, vitamin D deficiency, and hypertension, presents for a follow-up visit. He reports a weight gain of six pounds over the past month despite adhering to a low-carb diet 75% of the time and engaging in cardio exercises 2-3 times per week. He denies any changes in his hunger levels.  The patient's blood pressure readings were elevated at 145/76 and 145/71, indicating uncontrolled hypertension. He confirms adherence to his current medication regimen, including amlodipine for hypertension, Wegovy for polyphagia, and prescription vitamin D twice weekly for vitamin D deficiency. However, he reports not having received the [facility] due to potential insurance coverage issues.  In addition to the weight gain and hypertension, the patient has been experiencing increased urinary frequency for the past couple of months, including nocturia. He denies dysuria or hematuria. He also reports recent lab work, including a hemoglobin A1c in the prediabetes range and slightly off thyroid numbers. He denies any new medications or changes in his current regimen.  The patient expresses interest in potential weight loss medications and is open to changes in his eating plan. He acknowledges difficulty in maintaining  a low-carb diet consistently and expresses willingness to return to a previous eating plan. He also expresses concern about increased urinary frequency, but denies any associated symptoms such as dysuria or hematuria.          PHYSICAL EXAM:  Blood pressure (!) 145/71, pulse 76, temperature 98.2 F (36.8 C), height 5\' 11"  (1.803 m), weight (!) 378 lb (171.5 kg), SpO2 98%. Body mass index is 52.72 kg/m.  DIAGNOSTIC DATA REVIEWED:  BMET    Component Value Date/Time   NA 141 07/21/2023 1022   K 4.0 07/21/2023 1022   CL 104 07/21/2023 1022   CO2 25 07/21/2023 1022   GLUCOSE 92 07/21/2023 1022   GLUCOSE 87 03/09/2021 1305   BUN 7 07/21/2023 1022   CREATININE 1.33 (H) 07/21/2023 1022   CREATININE 1.32 08/25/2017 1655   CALCIUM 9.6 07/21/2023 1022   GFRNONAA 72 08/25/2017 1655   GFRAA 84 08/25/2017 1655   Lab Results  Component Value Date   HGBA1C 5.8 (H) 07/21/2023   HGBA1C 5.6 12/31/2013   Lab Results  Component Value Date   INSULIN 27.5 (H) 11/20/2022   Lab Results  Component Value Date   TSH 4.760 (H) 07/21/2023   CBC    Component Value Date/Time   WBC 7.3 07/21/2023 1022   WBC 7.5 08/25/2017 1655   RBC 5.19 07/21/2023 1022   RBC 5.18 08/25/2017 1655   HGB 15.0 07/21/2023 1022   HCT 46.6 07/21/2023 1022   PLT 309 07/21/2023 1022   MCV 90 07/21/2023 1022   MCH 28.9 07/21/2023 1022   MCH 28.2 08/25/2017 1655   MCHC 32.2 07/21/2023 1022  MCHC 33.5 08/25/2017 1655   RDW 14.1 07/21/2023 1022   Iron Studies    Component Value Date/Time   IRON 56 03/14/2016 1056   TIBC 348 03/14/2016 1056   FERRITIN 76 03/14/2016 1056   IRONPCTSAT 16 03/14/2016 1056   Lipid Panel     Component Value Date/Time   CHOL 247 (H) 07/21/2023 1022   TRIG 203 (H) 07/21/2023 1022   HDL 44 07/21/2023 1022   CHOLHDL 5.6 (H) 07/21/2023 1022   CHOLHDL 4.8 05/20/2017 1215   VLDL 31 (H) 02/06/2017 1151   LDLCALC 165 (H) 07/21/2023 1022   LDLCALC 144 (H) 05/20/2017 1215   Hepatic  Function Panel     Component Value Date/Time   PROT 6.5 07/21/2023 1022   ALBUMIN 4.3 07/21/2023 1022   AST 19 07/21/2023 1022   ALT 21 07/21/2023 1022   ALKPHOS 53 07/21/2023 1022   BILITOT 0.2 07/21/2023 1022   BILIDIR 0.1 05/20/2017 1215   IBILI 0.5 05/20/2017 1215      Component Value Date/Time   TSH 4.760 (H) 07/21/2023 1022   Nutritional Lab Results  Component Value Date   VD25OH 21.5 (L) 02/06/2023   VD25OH 7.2 (L) 11/20/2022   VD25OH 18 (L) 05/20/2017     Assessment and Plan    Obesity Weight gain of 6 pounds over the past month despite reported adherence to low carb diet 75% of the time and regular exercise. Reginal Lutes not started due to insurance coverage issues. -Return to category four eating plan. -Continue exercise regimen. -Consider appetite suppressant or craving management medication if necessary at follow-up in three weeks.  Hypertension Blood pressure readings of 145/76 and 145/71, indicating uncontrolled hypertension despite current medication (amlodipine). -Add chlorthalidone to current regimen to manage fluid retention and lower blood pressure.  Vitamin D Deficiency Currently on prescription vitamin D twice weekly. -Reduce vitamin D prescription to once weekly.  Polyuria Reports frequent urination, including at night. No signs of diabetes or bladder issues.  -Monitor symptoms and reassess at next visit as this may be due to decreasing simple carbs in his diet. Pt to contact his PCP if polyurea increases or other symptoms develop.  Follow-up in three weeks to assess progress and adjust treatment plan as necessary.        He was informed of the importance of frequent follow up visits to maximize his success with intensive lifestyle modifications for his multiple health conditions.    Quillian Quince, MD

## 2023-07-24 ENCOUNTER — Ambulatory Visit: Payer: 59 | Admitting: Neurology

## 2023-07-24 ENCOUNTER — Encounter: Payer: Self-pay | Admitting: Neurology

## 2023-07-24 VITALS — BP 129/71 | HR 84 | Ht 72.0 in | Wt 377.8 lb

## 2023-07-24 DIAGNOSIS — G932 Benign intracranial hypertension: Secondary | ICD-10-CM

## 2023-07-24 MED ORDER — TOPIRAMATE 100 MG PO TABS: 100.0000 mg | ORAL_TABLET | Freq: Two times a day (BID) | ORAL | 3 refills | Status: AC

## 2023-07-24 NOTE — Patient Instructions (Addendum)
Please call to schedule eye exam:  [facility]  [address] [place], [place] [address] [phone]  Continue Topamax  Continue to work on weight loss, weight loss is key to long term management of IIH Call for headaches, vision change  Follow up in 6 months

## 2023-07-24 NOTE — Progress Notes (Signed)
Patient: David Tanner Date of Birth: 1985-12-16  Reason for Visit: Follow up History from: Patient Primary Neurologist: Dr.Yan  ASSESSMENT AND PLAN 37 y.o. year old male   1.  Benign intracranial hypertension 2.  Morbid obesity, BMI 51 -We discussed the importance of follow-up with ophthalmology for evaluation of papilledema, I again placed a referral to Dr. Dione Booze as well as I gave the patient contact information to call and schedule an appointment -Continue Topamax 100 mg twice a day -Encouraged to continue to work on weight loss, as weight loss is key for long-term management of intracranial hypertension -Normal MRI of the brain and orbit with without contrast -Fluoroscopy guided in September 2022 lumbar puncture showed opening pressure of 46 cmH2O -Call for any new or worsening symptoms, follow-up with me in 6 months  HISTORY  David Tanner is a 37 year old male, seen in request by his primary care physician Dr. Anitra Lauth, [facility], for evaluation of bilateral papillary edema, initial evaluation was June 14, 2021,   I reviewed and summarized the referring note.PMHX. 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 Genia Del on March 09, 2021, was noted to have bilateral disc edema, there is no macular, normal vessel, he was referred to emergency room,  Personally reviewed normal MRI brain, orbit w/wo  Laboratory evaluations in 2018 showed negative ANA, complement, RPR, hepatitis C, ANCA, A1c was 5.5, TSH was elevated 7.0,  lipid panel LDL 144   He denies frequent headaches, denies visual changes,   UPDATE Sep 27 2021: Follow-up for benign intracranial hypertension, fluoroscopy guided lumbar puncture on June 25, 2021 showed opening pressure of 46 cmH2O  Is tolerating Topamax 100 mg twice a day well, he denies significant side effect, no significant weight loss, weight is 362 today, he denies significant visual changes  Spinal fluid testing was normal, laboratory evaluation showed normal ESR C-reactive protein, ANA, CMP was elevated creatinine 1.45, normal CBC hemoglobin of 15.4, A1c of 5.9, slight elevated TSH, normal total T4, B12 was decreased to 150, negative RPR, HIV  Update May 23, 2022 SS: Remains on Topamax 100 mg twice daily, no problems. No headaches or vision changes. Weight is 386 lbs, up 24 lbs since Dec 2022, claims tries to work out.  On B12 injections.  Not sure when he last saw his eye doctor.  Update January 09, 2023 SS: remains on Topamax 100 mg BID, has not followed up with eye doctor. No headaches or vision changes. Has lost 11 lbs since lost seen! Is going to weight loss, on metformin, on a diet. No new issues or concerns.  Update July 24, 2023 SS: Weight is about the same at 377 lbs, remains on Topamax 100 mg twice daily. He didn't get in to see Dr. Dione Booze. At times has woozy feeling in head, no headache or vision change. Yesterday moving a window, felt lightheaded, his BP has been up this week. Insurance won't pay for [employer]. Going to wellness center. Exercising working on diet. Labs 07/21/23, TSH 4.760, cholesterol 247, LDL 165, A1C 5.8, creatinine 1.33.  REVIEW OF SYSTEMS: Out of a complete 14 system review of symptoms, the patient complains only of the following symptoms, and all other reviewed systems are negative.  See HPI  ALLERGIES: No Known Allergies  HOME MEDICATIONS: Outpatient Medications Prior to Visit  Medication Sig Dispense Refill   amLODipine (NORVASC) 10 MG  tablet Take 1 tablet (10 mg total) by mouth daily. 90 tablet 0   aspirin EC 81 MG tablet Take 81 mg by mouth 2 (two) times daily.     chlorthalidone (HYGROTON) 25 MG tablet Take 1 tablet (25 mg total) by mouth daily. 30 tablet 0   cyanocobalamin (VITAMIN B12) 1000 MCG/ML injection Inject 1 mL (1,000 mcg total) into the muscle every 14 (fourteen) days. 10 mL 0   levothyroxine (SYNTHROID) 75 MCG tablet Take 1 tablet (75 mcg total) by mouth daily. 90 tablet 0   metFORMIN (GLUCOPHAGE-XR) 500 MG 24 hr tablet Take 1 tablet (500 mg total) by mouth daily with supper. 90 tablet 0   Semaglutide-Weight Management (WEGOVY) 0.25 MG/0.5ML SOAJ Inject 0.25 mg into the skin once a week. 2 mL 0   topiramate (TOPAMAX) 100 MG tablet Take 1 tablet (100 mg total) by mouth 2 (two) times daily. 180 tablet 3   Vitamin D, Ergocalciferol, (DRISDOL) 1.25 MG (50000 UNIT) CAPS capsule Take 1 capsule (50,000 Units total) by mouth 2 (two) times a week. 10 capsule 0   No facility-administered medications prior to visit.    PAST MEDICAL HISTORY: Past Medical History:  Diagnosis Date   DVT (deep venous thrombosis) (HCC)    Left leg   Hyperlipidemia    Hypothyroid    Vitamin D deficiency     PAST SURGICAL HISTORY: No past surgical history on file.  FAMILY HISTORY: Family History  Problem Relation Age of Onset   Hypertension Mother    Diabetes Father    Cancer Maternal Grandfather        leukemia    SOCIAL HISTORY: Social History   Socioeconomic History   Marital status: Single    Spouse name: Barista   Number of children: Not on file   Years of education: Not on file   Highest education level: Not on file  Occupational History   Occupation: Delivery Driver  Tobacco Use   Smoking status: Former    Current packs/day: 0.25    Average packs/day: 0.3 packs/day for 1 year (0.3 ttl pk-yrs)    Types: Cigarettes   Smokeless tobacco: Never   Tobacco comments:    Patient states he quit smoking about 2 years  ago (approx 2020)  Vaping Use   Vaping status: Never Used  Substance and Sexual Activity   Alcohol use: No    Alcohol/week: 0.0 standard drinks of alcohol   Drug use: No   Sexual activity: Yes    Comment: With girlfriend for 4 years  Other Topics Concern   Not on file  Social History Narrative   Lives with wife and 3 kids   Right Handed   Drinks 6-7 cups caffeine   Social Determinants of Health   Financial Resource Strain: Not on file  Food Insecurity: No Food Insecurity (10/10/2021)   Hunger Vital Sign    Worried About Running Out of Food in the Last Year: Never true    Ran Out of Food in the Last Year: Never true  Transportation Needs: Not on file  Physical Activity: Not on file  Stress: Not on file  Social Connections: Not on file  Intimate  Partner Violence: Unknown (01/03/2022)   Received [facility]   HITS    Physically Hurt: Not on file    Insult or Talk Down To: Not on file    Threaten Physical Harm: Not on file    Scream or Curse: Not on file   PHYSICAL EXAM  Vitals:   07/24/23 1511  BP: 129/71  Pulse: 84  Weight: (!) 377 lb 12.8 oz (171.4 kg)  Height: 6' (1.829 m)    Body mass index is 51.24 kg/m.  Generalized: Well developed, in no acute distress  Neurological examination  Mentation: Alert oriented to time, place, history taking. Follows all commands speech and language fluent Cranial nerve II-XII: Pupils were equal round reactive to light. Extraocular movements were full, visual field were full on confrontational test. I could not visualize the back of the eye well. Facial sensation and strength were normal. Head turning and shoulder shrug were normal and symmetric. Motor: The motor testing reveals 5 over 5 strength of all 4 extremities. Good symmetric motor tone is noted throughout.  Sensory: Sensory testing is intact to soft touch on all 4 extremities. No evidence of extinction is noted.  Coordination: Cerebellar testing reveals  good finger-nose-finger and heel-to-shin bilaterally.  Gait and station: Gait is normal.  DIAGNOSTIC DATA (LABS, IMAGING, TESTING) - I reviewed patient records, labs, notes, testing and imaging myself where available.  Lab Results  Component Value Date   WBC 7.3 07/21/2023   HGB 15.0 07/21/2023   HCT 46.6 07/21/2023   MCV 90 07/21/2023   PLT 309 07/21/2023      Component Value Date/Time   NA 141 07/21/2023 1022   K 4.0 07/21/2023 1022   CL 104 07/21/2023 1022   CO2 25 07/21/2023 1022   GLUCOSE 92 07/21/2023 1022   GLUCOSE 87 03/09/2021 1305   BUN 7 07/21/2023 1022   CREATININE 1.33 (H) 07/21/2023 1022   CREATININE 1.32 08/25/2017 1655   CALCIUM 9.6 07/21/2023 1022   PROT 6.5 07/21/2023 1022   ALBUMIN 4.3 07/21/2023 1022   AST 19 07/21/2023 1022   ALT 21 07/21/2023 1022   ALKPHOS 53 07/21/2023 1022   BILITOT 0.2 07/21/2023 1022   GFRNONAA 72 08/25/2017 1655   GFRAA 84 08/25/2017 1655   Lab Results  Component Value Date   CHOL 247 (H) 07/21/2023   HDL 44 07/21/2023   LDLCALC 165 (H) 07/21/2023   TRIG 203 (H) 07/21/2023   CHOLHDL 5.6 (H) 07/21/2023   Lab Results  Component Value Date   HGBA1C 5.8 (H) 07/21/2023   Lab Results  Component Value Date   VITAMINB12 234 02/06/2023   Lab Results  Component Value Date   TSH 4.760 (H) 07/21/2023    Margie Ege, AGNP-C, DNP 07/24/2023, 3:17 PM [facility] [address] [place], [place] [address] [phone]

## 2023-07-28 ENCOUNTER — Telehealth: Payer: Self-pay | Admitting: Neurology

## 2023-07-28 NOTE — Telephone Encounter (Signed)
Referral for ophthalmology fax to [facility]. Phone: [phone], Fax: [phone]. 

## 2023-07-31 ENCOUNTER — Other Ambulatory Visit (HOSPITAL_BASED_OUTPATIENT_CLINIC_OR_DEPARTMENT_OTHER): Payer: Self-pay | Admitting: Family Medicine

## 2023-07-31 DIAGNOSIS — E038 Other specified hypothyroidism: Secondary | ICD-10-CM

## 2023-07-31 MED ORDER — LEVOTHYROXINE SODIUM 88 MCG PO TABS: 88.0000 ug | ORAL_TABLET | Freq: Every day | ORAL | 1 refills | Status: DC

## 2023-08-20 ENCOUNTER — Ambulatory Visit (INDEPENDENT_AMBULATORY_CARE_PROVIDER_SITE_OTHER): Payer: 59 | Admitting: Family Medicine

## 2023-08-23 ENCOUNTER — Other Ambulatory Visit (INDEPENDENT_AMBULATORY_CARE_PROVIDER_SITE_OTHER): Payer: Self-pay | Admitting: Family Medicine

## 2023-08-23 DIAGNOSIS — I10 Essential (primary) hypertension: Secondary | ICD-10-CM

## 2023-08-25 ENCOUNTER — Ambulatory Visit (INDEPENDENT_AMBULATORY_CARE_PROVIDER_SITE_OTHER): Payer: 59 | Admitting: Adult Health

## 2023-08-26 ENCOUNTER — Encounter (INDEPENDENT_AMBULATORY_CARE_PROVIDER_SITE_OTHER): Payer: Self-pay | Admitting: Adult Health

## 2023-08-26 ENCOUNTER — Telehealth (INDEPENDENT_AMBULATORY_CARE_PROVIDER_SITE_OTHER): Payer: Self-pay

## 2023-08-26 ENCOUNTER — Ambulatory Visit (INDEPENDENT_AMBULATORY_CARE_PROVIDER_SITE_OTHER): Payer: 59 | Admitting: Adult Health

## 2023-08-26 VITALS — BP 129/84 | HR 61 | Temp 97.8°F | Ht 71.0 in | Wt 380.0 lb

## 2023-08-26 DIAGNOSIS — Z6823 Body mass index (BMI) 23.0-23.9, adult: Secondary | ICD-10-CM

## 2023-08-26 DIAGNOSIS — I10 Essential (primary) hypertension: Secondary | ICD-10-CM | POA: Diagnosis not present

## 2023-08-26 DIAGNOSIS — E538 Deficiency of other specified B group vitamins: Secondary | ICD-10-CM

## 2023-08-26 DIAGNOSIS — R7303 Prediabetes: Secondary | ICD-10-CM | POA: Diagnosis not present

## 2023-08-26 DIAGNOSIS — E669 Obesity, unspecified: Secondary | ICD-10-CM

## 2023-08-26 DIAGNOSIS — E559 Vitamin D deficiency, unspecified: Secondary | ICD-10-CM | POA: Diagnosis not present

## 2023-08-26 DIAGNOSIS — E039 Hypothyroidism, unspecified: Secondary | ICD-10-CM | POA: Diagnosis not present

## 2023-08-26 DIAGNOSIS — Z6841 Body Mass Index (BMI) 40.0 and over, adult: Secondary | ICD-10-CM

## 2023-08-26 MED ORDER — METFORMIN HCL ER 500 MG PO TB24: 500.0000 mg | ORAL_TABLET | Freq: Every day | ORAL | 0 refills | Status: DC

## 2023-08-26 MED ORDER — VITAMIN D (ERGOCALCIFEROL) 1.25 MG (50000 UNIT) PO CAPS: 50000.0000 [IU] | ORAL_CAPSULE | ORAL | 0 refills | Status: DC

## 2023-08-26 MED ORDER — WEGOVY 0.25 MG/0.5ML ~~LOC~~ SOAJ: 0.2500 mg | SUBCUTANEOUS | 0 refills | Status: DC

## 2023-08-26 MED ORDER — CHLORTHALIDONE 25 MG PO TABS: 25.0000 mg | ORAL_TABLET | Freq: Every day | ORAL | 0 refills | Status: DC

## 2023-08-26 MED ORDER — CYANOCOBALAMIN 1000 MCG/ML IJ SOLN
1000.0000 ug | INTRAMUSCULAR | 0 refills | Status: AC
Start: 2023-08-26 — End: ?

## 2023-08-26 NOTE — Telephone Encounter (Signed)
I tried to do a PA for [facility] for this patient but I got an automatic reply saying it was not covered.    ! Product not covered.

## 2023-08-26 NOTE — Progress Notes (Signed)
WEIGHT SUMMARY AND BIOMETRICS  Vitals Temp: 97.8 F (36.6 C) BP: 129/84 Pulse Rate: 61 SpO2: 99 %   Anthropometric Measurements Height: 5\' 11"  (1.803 m) Weight: (!) 380 lb (172.4 kg) BMI (Calculated): 53.02 Weight at Last Visit: 378lb Weight Lost Since Last Visit: 0 Weight Gained Since Last Visit: 2lb Starting Weight: 380lb Total Weight Loss (lbs): 0 lb (0 kg)   Body Composition  Body Fat %: 44.9 % Fat Mass (lbs): 170.6 lbs Muscle Mass (lbs): 199.2 lbs Visceral Fat Rating : 31   Other Clinical Data Fasting: no Labs: no Today's Visit #: 11 Starting Date: 11/20/22    Chief Complaint:   OBESITY David Tanner is here to discuss his progress with his obesity treatment plan. He is on the following a lower carbohydrate, vegetable and lean protein rich diet plan and states he is following his eating plan approximately 70 % of the time. He states he is exercising Job/Treadmill/Sled Pull 45-60 minutes 3 times per week.   Interim History:  He was provided Rx Wegovy 0.25mg  on 05/27/2023 PA was never completed, therefore he never started Rx He has remained on daily Metformin XR 500mg   David Tanner provided the followinf food recall that is typical of  a day: Breakfast: Either Skips or [employer] Chicken Biscuit meal (drink : either Hi-C or Sweet Tea) Lunch : Either skips or  Dinner: "Fiend for himself"  Subjective:   Pre diabetes He was provided Rx Wegovy 0.25mg  on 05/27/2023 PA was never completed, therefore he never started Rx He has remained on daily Metformin XR 500mg   2. Essential hypertension BP above goal at OV He reports taking all antihypertensives today He denies CP or dyspnea with exertion. He is currently on aspirin EC 81 MG tablet  amLODipine (NORVASC) 10 MG tablet  chlorthalidone (HYGROTON) 25 MG tablet   3. Hypothryoidism   Latest Reference Range & Units 07/21/23 10:22  TSH 0.450 - 4.500 uIU/mL 4.760 (H)  (H): Data is abnormally high  PCP  increased Levothyroxine from 75 mcg to 88 mcg daily He will f/u with PCP early Dec 2024 He endorses stable energy levels  4. Vit D Def  Latest Reference Range & Units 11/20/22 11:53 02/06/23 16:32  Vitamin D, 25-Hydroxy 30.0 - 100.0 ng/mL 7.2 (L) 21.5 (L)  (L): Data is abnormally low  He is on bi-weekly Ergocalciferol- denies N/V/Muscle Weakness He endorses stable energy levels  5. B12 Def  Latest Reference Range & Units 02/14/22 09:29 04/25/22 08:58 07/16/22 10:06 11/20/22 11:53 02/06/23 16:32  Vitamin B12 232 - 1,245 pg/mL 150 (L) 949 402 269 234  (L): Data is abnormally low  He is on bi-weekly B12 Injection 1000mg /mL He endorses stable energy levels  Assessment/Plan:   Pre diabetes Refill metFORMIN (GLUCOPHAGE-XR) 500 MG 24 hr tablet Take 1 tablet (500 mg total) by mouth daily with supper. Dispense: 90 tablet, Refills: 0 ordered   2. Essential hypertension Reduce Na+ in diet Increase cardiovascular exercise Check home readings when able Refill  chlorthalidone (HYGROTON) 25 MG tablet Take 1 tablet (25 mg total) by mouth daily. Dispense: 30 tablet, Refills: 0 ordered   3. Hypothryoidism Continue 88 mcg daily F/u with PCP early Dec 2024  4. Vit D Def Refill  Vitamin D, Ergocalciferol, (DRISDOL) 1.25 MG (50000 UNIT) CAPS capsule (Future) Take 1 capsule (50,000 Units total) by mouth 2 (two) times a week. Dispense: 10 capsule, Refills: 0 ordered   5. B12 Def Refill  cyanocobalamin (VITAMIN B12) 1000 MCG/ML injection  Inject 1 mL (1,000 mcg total) into the muscle every 14 (fourteen) days. Dispense: 10 mL, Refills: 0 ordered   6. BMI 50.0-59.9, adult (HCC), Current BMI 23.02 Start Semaglutide-Weight Management (WEGOVY) 0.25 MG/0.5ML SOAJ Inject 0.25 mg into the skin once a week. Dispense: 2 mL, Refills: 0 ordered   David Tanner is currently in the action stage of change. As such, his goal is to continue with weight loss efforts. He has agreed to following a lower  carbohydrate, vegetable and lean protein rich diet plan.   Exercise goals: All adults should avoid inactivity. Some physical activity is better than none, and adults who participate in any amount of physical activity gain some health benefits. Adults should also include muscle-strengthening activities that involve all major muscle groups on 2 or more days a week.  Behavioral modification strategies: increasing lean protein intake, decreasing simple carbohydrates, increasing vegetables, increasing water intake, increasing high fiber foods, decreasing eating out, no skipping meals, meal planning and cooking strategies, keeping healthy foods in the home, ways to avoid boredom eating, holiday eating strategies , and planning for success.  David Tanner has agreed to follow-up with our clinic in 4 weeks. He was informed of the importance of frequent follow-up visits to maximize his success with intensive lifestyle modifications for his multiple health conditions.   Objective:   Blood pressure 129/84, pulse 61, temperature 97.8 F (36.6 C), height 5\' 11"  (1.803 m), weight (!) 380 lb (172.4 kg), SpO2 99%. Body mass index is 53 kg/m.  General: Cooperative, alert, well developed, in no acute distress. HEENT: Conjunctivae and lids unremarkable. Cardiovascular: Regular rhythm.  Lungs: Normal work of breathing. Neurologic: No focal deficits.   Lab Results  Component Value Date   CREATININE 1.33 (H) 07/21/2023   BUN 7 07/21/2023   NA 141 07/21/2023   K 4.0 07/21/2023   CL 104 07/21/2023   CO2 25 07/21/2023   Lab Results  Component Value Date   ALT 21 07/21/2023   AST 19 07/21/2023   ALKPHOS 53 07/21/2023   BILITOT 0.2 07/21/2023   Lab Results  Component Value Date   HGBA1C 5.8 (H) 07/21/2023   HGBA1C 5.7 (H) 02/06/2023   HGBA1C 5.9 (H) 11/20/2022   HGBA1C 5.8 (H) 12/11/2021   HGBA1C 5.9 (H) 06/14/2021   Lab Results  Component Value Date   INSULIN 27.5 (H) 11/20/2022   Lab Results   Component Value Date   TSH 4.760 (H) 07/21/2023   Lab Results  Component Value Date   CHOL 247 (H) 07/21/2023   HDL 44 07/21/2023   LDLCALC 165 (H) 07/21/2023   TRIG 203 (H) 07/21/2023   CHOLHDL 5.6 (H) 07/21/2023   Lab Results  Component Value Date   VD25OH 21.5 (L) 02/06/2023   VD25OH 7.2 (L) 11/20/2022   VD25OH 18 (L) 05/20/2017   Lab Results  Component Value Date   WBC 7.3 07/21/2023   HGB 15.0 07/21/2023   HCT 46.6 07/21/2023   MCV 90 07/21/2023   PLT 309 07/21/2023   Lab Results  Component Value Date   IRON 56 03/14/2016   TIBC 348 03/14/2016   FERRITIN 76 03/14/2016   Attestation Statements:   Reviewed by clinician on day of visit: allergies, medications, problem list, medical history, surgical history, family history, social history, and previous encounter notes.  I have reviewed the above documentation for accuracy and completeness, and I agree with the above. -  Kadiatou Oplinger d. Anum Palecek, NP-C

## 2023-09-01 ENCOUNTER — Ambulatory Visit (HOSPITAL_BASED_OUTPATIENT_CLINIC_OR_DEPARTMENT_OTHER): Payer: 59 | Admitting: Family Medicine

## 2023-09-01 ENCOUNTER — Telehealth: Payer: Self-pay | Admitting: Neurology

## 2023-09-01 NOTE — Telephone Encounter (Signed)
Called and spoke to representative at [employer] and requested medical records as asked for the record to be faxed over and they voiced understanding and stated they will fax record as asked

## 2023-09-01 NOTE — Telephone Encounter (Signed)
Can we get the full record report from Dr. Dione Booze to see level of papilledema and any comparison to prior visit? I received a summary of visit showing stating definite papilledema, recommended continue Topamax as directed by our office and aggressive weight loss. If patient is experiencing any vision issues/headaches/prior IIH symptoms we could offer another Lumbar Puncture. Ensure he is indeed taking Topamax as prescribed. We may also try to switch to Diamox if eye exam continues to show impressive papilledema. Thanks

## 2023-09-02 ENCOUNTER — Telehealth: Payer: Self-pay | Admitting: Neurology

## 2023-09-02 DIAGNOSIS — G932 Benign intracranial hypertension: Secondary | ICD-10-CM

## 2023-09-02 NOTE — Telephone Encounter (Signed)
I reviewed ophthalmology notes from Dr. Dione Booze 08/19/2023.  Bilateral definite papilledema.  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  LP in December 2022 showed opening pressure 46.    I called the patient, reports aching type headache, for the last 3-4 months. No tinnitus, muffled hearing, no vision changes.   Has not had any luck with weight loss. Reginal Lutes was ordered, pending insurance. We discussed long term weight loss is going to key to management of IIH.  I will order LP with opening pressure. Will check to see if CT head is needed prior to LP.   Weight is 380 lbs, BMI 53. He remains on Topamax 100 mg BID.

## 2023-09-02 NOTE — Telephone Encounter (Signed)
Orders Placed This Encounter  Procedures   DG FL GUIDED LUMBAR PUNCTURE   CT HEAD WO CONTRAST ( )

## 2023-09-02 NOTE — Addendum Note (Signed)
Addended by: Glean Salvo on: 09/02/2023 02:38 PM   Modules accepted: Orders

## 2023-09-03 ENCOUNTER — Ambulatory Visit (INDEPENDENT_AMBULATORY_CARE_PROVIDER_SITE_OTHER): Payer: 59 | Admitting: Family Medicine

## 2023-09-03 ENCOUNTER — Encounter (HOSPITAL_BASED_OUTPATIENT_CLINIC_OR_DEPARTMENT_OTHER): Payer: Self-pay | Admitting: Family Medicine

## 2023-09-03 VITALS — BP 142/96 | HR 65 | Temp 98.1°F | Ht 71.0 in | Wt 375.0 lb

## 2023-09-03 DIAGNOSIS — E039 Hypothyroidism, unspecified: Secondary | ICD-10-CM | POA: Diagnosis not present

## 2023-09-03 DIAGNOSIS — I10 Essential (primary) hypertension: Secondary | ICD-10-CM | POA: Diagnosis not present

## 2023-09-03 MED ORDER — CHLORTHALIDONE 25 MG PO TABS
25.0000 mg | ORAL_TABLET | Freq: Every day | ORAL | 1 refills | Status: DC
Start: 2023-09-03 — End: 2024-06-23

## 2023-09-03 MED ORDER — AMLODIPINE BESYLATE 10 MG PO TABS
10.0000 mg | ORAL_TABLET | Freq: Every day | ORAL | 1 refills | Status: DC
Start: 2023-09-03 — End: 2024-06-23

## 2023-09-03 NOTE — Assessment & Plan Note (Signed)
Blood pressure is borderline in office today with diastolic more notably elevated. He denies any issues with chest pain or headaches.  Has not been checking blood pressure at home.  Blood pressure has been generally better controlled at prior office visits both with [place] and other providers. Recommend intermittent monitoring at home, DASH diet.  Will not make changes to medication today. We will plan for close follow-up to reassess blood pressure with patient being on medication

## 2023-09-03 NOTE — Assessment & Plan Note (Signed)
Most recent TSH was slightly above reference range, dose of levothyroxine was increased at that time.  He has been taking updated dose for about 5 weeks.  Denies any symptoms or concerns at this time. We will check TSH today for monitoring and adjust levothyroxine accordingly

## 2023-09-03 NOTE — Progress Notes (Signed)
    Procedures performed today:    None.  Independent interpretation of notes and tests performed by another provider:   None.  Brief History, Exam, Impression, and Recommendations:    BP (!) 142/96 (BP Location: Right Arm, Patient Position: Sitting, Cuff Size: Normal)   Pulse 65   Temp 98.1 F (36.7 C) (Oral)   Ht 5\' 11"  (1.803 m)   Wt (!) 375 lb (170.1 kg)   SpO2 99%   BMI 52.30 kg/m   Hypothyroidism, unspecified type Assessment & Plan: Most recent TSH was slightly above reference range, dose of levothyroxine was increased at that time.  He has been taking updated dose for about 5 weeks.  Denies any symptoms or concerns at this time. We will check TSH today for monitoring and adjust levothyroxine accordingly  Orders: -     TSH  Essential hypertension Assessment & Plan: Blood pressure is borderline in office today with diastolic more notably elevated. He denies any issues with chest pain or headaches.  Has not been checking blood pressure at home.  Blood pressure has been generally better controlled at prior office visits both with [place] and other providers. Recommend intermittent monitoring at home, DASH diet.  Will not make changes to medication today. We will plan for close follow-up to reassess blood pressure with patient being on medication  Orders: -     amLODIPine Besylate; Take 1 tablet (10 mg total) by mouth daily.  Dispense: 90 tablet; Refill: 1 -     Chlorthalidone; Take 1 tablet (25 mg total) by mouth daily.  Dispense: 90 tablet; Refill: 1  Return in about 3 months (around 12/02/2023) for hypertension, hypothyroidism.   ___________________________________________ David Schembri de [place], MD, ABFM, CAQSM Primary Care and Sports Medicine [facility]

## 2023-09-04 LAB — TSH: TSH: 3.22 u[IU]/mL (ref 0.450–4.500)

## 2023-09-04 NOTE — Telephone Encounter (Signed)
[employer] Berkley Harvey: [id number] exp. 09/04/23-10/19/23

## 2023-09-09 ENCOUNTER — Ambulatory Visit
Admission: RE | Admit: 2023-09-09 | Discharge: 2023-09-09 | Disposition: A | Discharge status: Home / Self Care | Payer: 59 | Source: Ambulatory Visit | Attending: Neurology | Admitting: Neurology

## 2023-09-09 DIAGNOSIS — G932 Benign intracranial hypertension: Secondary | ICD-10-CM | POA: Diagnosis not present

## 2023-09-26 NOTE — Discharge Instructions (Signed)

## 2023-09-29 ENCOUNTER — Telehealth: Payer: Self-pay | Admitting: Neurology

## 2023-09-29 ENCOUNTER — Ambulatory Visit
Admission: RE | Admit: 2023-09-29 | Discharge: 2023-09-29 | Disposition: A | Discharge status: Home / Self Care | Payer: 59 | Source: Ambulatory Visit | Attending: Neurology | Admitting: Neurology

## 2023-09-29 DIAGNOSIS — G932 Benign intracranial hypertension: Secondary | ICD-10-CM

## 2023-09-29 NOTE — Telephone Encounter (Signed)
 Lmtrc 1st attempt

## 2023-09-29 NOTE — Telephone Encounter (Signed)
Please call, had LP today showing elevated opening pressure of 34 cm water. Normal should be less than 20. Consistent with IIH. Would recommend he continue Topamax 100 mg BID, work on aggressive weight loss, follow up with Dr. Dione Booze in the next few weeks to evaluate papilledema post LP. Can we send LP results over to Dr. Dione Booze? Thanks  IMPRESSION: 1. Elevated opening pressure of 34 cm H2O, normalized after removal of 27 mL CSF.

## 2023-09-30 ENCOUNTER — Ambulatory Visit (INDEPENDENT_AMBULATORY_CARE_PROVIDER_SITE_OTHER): Payer: 59 | Admitting: Adult Health

## 2023-10-02 ENCOUNTER — Ambulatory Visit (INDEPENDENT_AMBULATORY_CARE_PROVIDER_SITE_OTHER): Payer: 59 | Admitting: Adult Health

## 2023-10-08 ENCOUNTER — Other Ambulatory Visit (INDEPENDENT_AMBULATORY_CARE_PROVIDER_SITE_OTHER): Payer: Self-pay | Admitting: Adult Health

## 2023-10-08 ENCOUNTER — Ambulatory Visit (INDEPENDENT_AMBULATORY_CARE_PROVIDER_SITE_OTHER): Payer: Self-pay | Admitting: Adult Health

## 2023-10-08 ENCOUNTER — Encounter (INDEPENDENT_AMBULATORY_CARE_PROVIDER_SITE_OTHER): Payer: Self-pay | Admitting: Adult Health

## 2023-10-08 VITALS — BP 141/91 | HR 69 | Temp 98.2°F | Ht 71.0 in | Wt 373.0 lb

## 2023-10-08 DIAGNOSIS — I10 Essential (primary) hypertension: Secondary | ICD-10-CM

## 2023-10-08 DIAGNOSIS — Z6841 Body Mass Index (BMI) 40.0 and over, adult: Secondary | ICD-10-CM | POA: Diagnosis not present

## 2023-10-08 DIAGNOSIS — E559 Vitamin D deficiency, unspecified: Secondary | ICD-10-CM | POA: Diagnosis not present

## 2023-10-08 MED ORDER — WEGOVY 0.25 MG/0.5ML ~~LOC~~ SOAJ: 0.2500 mg | SUBCUTANEOUS | 0 refills | Status: DC

## 2023-10-08 MED ORDER — VITAMIN D (ERGOCALCIFEROL) 1.25 MG (50000 UNIT) PO CAPS: 50000.0000 [IU] | ORAL_CAPSULE | ORAL | 0 refills | Status: DC

## 2023-10-08 MED ORDER — ZEPBOUND 2.5 MG/0.5ML ~~LOC~~ SOAJ: 2.5000 mg | SUBCUTANEOUS | 0 refills | Status: AC

## 2023-10-08 MED ORDER — BLOOD PRESSURE KIT DEVI: 1.0000 | Freq: Every day | 0 refills | Status: DC

## 2023-10-08 NOTE — Progress Notes (Signed)
 WEIGHT SUMMARY AND BIOMETRICS  Vitals Temp: 98.2 F (36.8 C) BP: (!) 141/91 Pulse Rate: 69 SpO2: 97 %   Anthropometric Measurements Height: 5' 11 (1.803 m) Weight: (!) 373 lb (169.2 kg) BMI (Calculated): 52.05 Weight at Last Visit: 380lb Weight Lost Since Last Visit: 7lb Weight Gained Since Last Visit: 0 Starting Weight: 383lb Total Weight Loss (lbs): 10 lb (4.536 kg)   Body Composition  Body Fat %: 42.9 % Fat Mass (lbs): 160.4 lbs Muscle Mass (lbs): 203 lbs Total Body Water (lbs): 161.8 lbs Visceral Fat Rating : 29   Other Clinical Data Fasting: no Labs: no Today's Visit #: 12 Starting Date: 11/20/22    Chief Complaint:   OBESITY David Tanner is here to discuss his progress with his obesity treatment plan. He is on the following a lower carbohydrate, vegetable and lean protein rich diet plan and states he is following his eating plan approximately 60 % of the time.  He states he is exercising Sled Puller and Treadmill 60+ minutes 5-7 times per week.   Interim History:  [place]  Rx sent in at last OV on 08/26/2023 Insurance denied  He has changed from [facility] to [place]  He denies family hx of MENS 2 or MTC. He denies personal hx of pancreatitis. Discussed risks benefits of GLP-1 and GIP/GLP-1 therapy.  Reviewed Bioimpedance results with pt: Muscle Mass: +3.8 lbs Adipose Mass: -10.2 lbs  Subjective:   1. Essential hypertension BP above goal He denies acute cardiac sx's He reports taking all prescribed antihypertensives:  amLODipine  (NORVASC ) 10 MG tablet  chlorthalidone  (HYGROTON ) 25 MG tablet   He is not checking BP at home, as he does not have home BP cuff  2. Vitamin D  deficiency He endorses stable energy levels He is on weekly Ergocalciferol - denies N/V/Muscle Weakness  Assessment/Plan:   1. Essential hypertension Blood Pressure Monitoring (BLOOD PRESSURE KIT) DEVI 1 kit by Does not apply route daily. Dispense: 1 each, Refills: 0 ordered    Check BP - bring readings to next OV Limit Na+ intake  2. Vitamin D  deficiency Refill - Vitamin D , Ergocalciferol , (DRISDOL ) 1.25 MG (50000 UNIT) CAPS capsule; Take 1 capsule (50,000 Units total) by mouth 2 (two) times a week.  Dispense: 10 capsule; Refill: 0  3. BMI 50.0-59.9, adult (HCC), Current BMI 52.05 Start tirzepatide  (ZEPBOUND ) 2.5 MG/0.5ML Pen Inject 2.5 mg into the skin once a week. Dispense: 3 mL, Refills: 0 ordered   David Tanner is currently in the action stage of change. As such, his goal is to continue with weight loss efforts. He has agreed to the Category 4 Plan. 200 Snack Calories  Exercise goals: For substantial health benefits, adults should do at least 150 minutes (2 hours and 30 minutes) a week of moderate-intensity, or 75 minutes (1 hour and 15 minutes) a week of vigorous-intensity aerobic physical activity, or an equivalent combination of moderate- and vigorous-intensity aerobic activity. Aerobic activity should be performed in episodes of at least 10 minutes, and preferably, it should be spread throughout the week.  Behavioral modification strategies: increasing lean protein intake, decreasing simple carbohydrates, increasing vegetables, increasing water intake, meal planning and cooking strategies, keeping healthy foods in the home, ways to avoid boredom eating, and planning for success.  Amer has agreed to follow-up with our clinic in 4 weeks. He was informed of the importance of frequent follow-up visits to maximize his success with intensive lifestyle modifications for his multiple health conditions.   Objective:   Blood pressure ROLLEN)  141/91, pulse 69, temperature 98.2 F (36.8 C), height 5' 11 (1.803 m), weight (!) 373 lb (169.2 kg), SpO2 97%. Body mass index is 52.02 kg/m.  General: Cooperative, alert, well developed, in no acute distress. HEENT: Conjunctivae and lids unremarkable. Cardiovascular: Regular rhythm.  Lungs: Normal work of  breathing. Neurologic: No focal deficits.   Lab Results  Component Value Date   CREATININE 1.33 (H) 07/21/2023   BUN 7 07/21/2023   NA 141 07/21/2023   K 4.0 07/21/2023   CL 104 07/21/2023   CO2 25 07/21/2023   Lab Results  Component Value Date   ALT 21 07/21/2023   AST 19 07/21/2023   ALKPHOS 53 07/21/2023   BILITOT 0.2 07/21/2023   Lab Results  Component Value Date   HGBA1C 5.8 (H) 07/21/2023   HGBA1C 5.7 (H) 02/06/2023   HGBA1C 5.9 (H) 11/20/2022   HGBA1C 5.8 (H) 12/11/2021   HGBA1C 5.9 (H) 06/14/2021   Lab Results  Component Value Date   INSULIN  27.5 (H) 11/20/2022   Lab Results  Component Value Date   TSH 3.220 09/03/2023   Lab Results  Component Value Date   CHOL 247 (H) 07/21/2023   HDL 44 07/21/2023   LDLCALC 165 (H) 07/21/2023   TRIG 203 (H) 07/21/2023   CHOLHDL 5.6 (H) 07/21/2023   Lab Results  Component Value Date   VD25OH 21.5 (L) 02/06/2023   VD25OH 7.2 (L) 11/20/2022   VD25OH 18 (L) 05/20/2017   Lab Results  Component Value Date   WBC 7.3 07/21/2023   HGB 15.0 07/21/2023   HCT 46.6 07/21/2023   MCV 90 07/21/2023   PLT 309 07/21/2023   Lab Results  Component Value Date   IRON 56 03/14/2016   TIBC 348 03/14/2016   FERRITIN 76 03/14/2016   Attestation Statements:   Reviewed by clinician on day of visit: allergies, medications, problem list, medical history, surgical history, family history, social history, and previous encounter notes.  I have reviewed the above documentation for accuracy and completeness, and I agree with the above. -  Tamma Brigandi d. Davy Westmoreland, NP-C

## 2023-10-09 ENCOUNTER — Other Ambulatory Visit (INDEPENDENT_AMBULATORY_CARE_PROVIDER_SITE_OTHER): Payer: Self-pay | Admitting: Adult Health

## 2023-10-26 ENCOUNTER — Other Ambulatory Visit (HOSPITAL_BASED_OUTPATIENT_CLINIC_OR_DEPARTMENT_OTHER): Payer: Self-pay | Admitting: Family Medicine

## 2023-10-26 DIAGNOSIS — E038 Other specified hypothyroidism: Secondary | ICD-10-CM

## 2023-11-03 ENCOUNTER — Ambulatory Visit (INDEPENDENT_AMBULATORY_CARE_PROVIDER_SITE_OTHER): Payer: 59 | Admitting: Adult Health

## 2023-11-05 ENCOUNTER — Ambulatory Visit (INDEPENDENT_AMBULATORY_CARE_PROVIDER_SITE_OTHER): Payer: 59 | Admitting: Family Medicine

## 2023-11-05 VITALS — BP 128/76 | HR 63 | Temp 98.4°F | Ht 71.0 in | Wt 372.0 lb

## 2023-11-05 DIAGNOSIS — Z6841 Body Mass Index (BMI) 40.0 and over, adult: Secondary | ICD-10-CM | POA: Insufficient documentation

## 2023-11-05 DIAGNOSIS — R7303 Prediabetes: Secondary | ICD-10-CM

## 2023-11-05 DIAGNOSIS — E669 Obesity, unspecified: Secondary | ICD-10-CM

## 2023-11-05 DIAGNOSIS — E559 Vitamin D deficiency, unspecified: Secondary | ICD-10-CM

## 2023-11-05 MED ORDER — METFORMIN HCL ER 500 MG PO TB24: 500.0000 mg | ORAL_TABLET | Freq: Two times a day (BID) | ORAL | 0 refills | Status: AC

## 2023-11-05 MED ORDER — VITAMIN D (ERGOCALCIFEROL) 1.25 MG (50000 UNIT) PO CAPS: 50000.0000 [IU] | ORAL_CAPSULE | ORAL | 0 refills | Status: AC

## 2023-11-05 NOTE — Progress Notes (Signed)
 .smr  Office: [phone]  /  Fax: [phone]  WEIGHT SUMMARY AND BIOMETRICS  Anthropometric Measurements Height: 5' 11 (1.803 m) Weight: (!) 372 lb (168.7 kg) BMI (Calculated): 51.91 Weight at Last Visit: 373 lb Weight Lost Since Last Visit: 1 lb Weight Gained Since Last Visit: 0 Starting Weight: 383 lb Total Weight Loss (lbs): 11 lb (4.99 kg)   Body Composition  Body Fat %: 42.8 % Fat Mass (lbs): 159.4 lbs Muscle Mass (lbs): 202.6 lbs Total Body Water (lbs): 164 lbs Visceral Fat Rating : 29   Other Clinical Data Fasting: No Labs: No Today's Visit #: 13 Starting Date: 11/20/22    Chief Complaint: OBESITY   History of Present Illness   David Tanner is a 38 year old male with obesity and prediabetes who presents for a follow-up on weight management and medication refills.  He is focused on managing his obesity through lifestyle changes. Over the past month, he has lost one pound and is actively working to reduce his carbohydrate intake, achieving success approximately 65% of the time. He engages in physical activity for about 60 minutes, four times a week, which he believes has contributed to his increased strength.  He has a history of prediabetes and is currently managing it with diet, exercise, and metformin . He takes one pill of metformin  daily with supper and reports no adverse effects from the medication.  He has a history of vitamin D  deficiency and is on a prescription vitamin D  regimen, taking it twice a week. He requests a refill for this medication.  In terms of social history, he works the first shift and performs significant lifting at work, which he feels has contributed to his increased strength. He notes an improvement in his blood pressure recently.          PHYSICAL EXAM:  Blood pressure 128/76, pulse 63, temperature 98.4 F (36.9 C), height 5' 11 (1.803 m), weight (!) 372 lb (168.7 kg), SpO2 100%. Body mass index is 51.88  kg/m.  DIAGNOSTIC DATA REVIEWED:  BMET    Component Value Date/Time   NA 141 07/21/2023 1022   K 4.0 07/21/2023 1022   CL 104 07/21/2023 1022   CO2 25 07/21/2023 1022   GLUCOSE 92 07/21/2023 1022   GLUCOSE 87 03/09/2021 1305   BUN 7 07/21/2023 1022   CREATININE 1.33 (H) 07/21/2023 1022   CREATININE 1.32 08/25/2017 1655   CALCIUM  9.6 07/21/2023 1022   GFRNONAA 72 08/25/2017 1655   GFRAA 84 08/25/2017 1655   Lab Results  Component Value Date   HGBA1C 5.8 (H) 07/21/2023   HGBA1C 5.6 12/31/2013   Lab Results  Component Value Date   INSULIN  27.5 (H) 11/20/2022   Lab Results  Component Value Date   TSH 3.220 09/03/2023   CBC    Component Value Date/Time   WBC 7.3 07/21/2023 1022   WBC 7.5 08/25/2017 1655   RBC 5.19 07/21/2023 1022   RBC 5.18 08/25/2017 1655   HGB 15.0 07/21/2023 1022   HCT 46.6 07/21/2023 1022   PLT 309 07/21/2023 1022   MCV 90 07/21/2023 1022   MCH 28.9 07/21/2023 1022   MCH 28.2 08/25/2017 1655   MCHC 32.2 07/21/2023 1022   MCHC 33.5 08/25/2017 1655   RDW 14.1 07/21/2023 1022   Iron Studies    Component Value Date/Time   IRON 56 03/14/2016 1056   TIBC 348 03/14/2016 1056   FERRITIN 76 03/14/2016 1056   IRONPCTSAT 16 03/14/2016 1056   Lipid  Panel     Component Value Date/Time   CHOL 247 (H) 07/21/2023 1022   TRIG 203 (H) 07/21/2023 1022   HDL 44 07/21/2023 1022   CHOLHDL 5.6 (H) 07/21/2023 1022   CHOLHDL 4.8 05/20/2017 1215   VLDL 31 (H) 02/06/2017 1151   LDLCALC 165 (H) 07/21/2023 1022   LDLCALC 144 (H) 05/20/2017 1215   Hepatic Function Panel     Component Value Date/Time   PROT 6.5 07/21/2023 1022   ALBUMIN 4.3 07/21/2023 1022   AST 19 07/21/2023 1022   ALT 21 07/21/2023 1022   ALKPHOS 53 07/21/2023 1022   BILITOT 0.2 07/21/2023 1022   BILIDIR 0.1 05/20/2017 1215   IBILI 0.5 05/20/2017 1215      Component Value Date/Time   TSH 3.220 09/03/2023 9076   Nutritional Lab Results  Component Value Date   VD25OH 21.5 (L)  02/06/2023   VD25OH 7.2 (L) 11/20/2022   VD25OH 18 (L) 05/20/2017     Assessment and Plan    Obesity Obesity with recent weight loss of one pound over the last month. Adheres to a low-carb diet 65% of the time and exercises 60 minutes four times per week. Discussed the importance of 99% adherence to the low-carb diet and avoiding high-fat foods like rib eye, which can impede weight loss. - Increase adherence to low-carb diet to 99% - Continue exercise regimen of 60 minutes four times per week  Prediabetes Prediabetes managed with diet, exercise, and metformin . Current metformin  dose is low. Prior authorization for Zepbound  was denied due to insurance policy. Plan to increase metformin  dosage to improve glycemic control. Discussed taking metformin  with food to avoid gastrointestinal side effects, which occur in 20% of patients when taken on an empty stomach. - Increase metformin  to 500 mg twice daily, taken with breakfast and supper - Monitor for gastrointestinal side effects and advise taking with food if queasy  Vitamin D  Deficiency Vitamin D  deficiency managed with prescription vitamin D . Requests a refill and is due for a vitamin D  level check. - Refill prescription vitamin D  - Order vitamin D  level check during next month's fasting labs  General Health Maintenance Due for updated labs including vitamin D  levels. Discussed the importance of fasting for accurate lab results (8 hours of no caloric intake, allowing water). - Schedule fasting labs for next month - Advise fasting for 8 hours prior to lab draw, allowing water intake  Follow-up - Follow-up visit in one month for lab review and treatment adjustment.        He was informed of the importance of frequent follow up visits to maximize his success with intensive lifestyle modifications for his multiple health conditions.    Louann Penton, MD

## 2023-11-19 ENCOUNTER — Other Ambulatory Visit (INDEPENDENT_AMBULATORY_CARE_PROVIDER_SITE_OTHER): Payer: Self-pay | Admitting: Family Medicine

## 2023-11-19 DIAGNOSIS — R7303 Prediabetes: Secondary | ICD-10-CM

## 2023-12-02 ENCOUNTER — Ambulatory Visit (HOSPITAL_BASED_OUTPATIENT_CLINIC_OR_DEPARTMENT_OTHER): Payer: 59 | Admitting: Family Medicine

## 2023-12-23 ENCOUNTER — Ambulatory Visit (HOSPITAL_BASED_OUTPATIENT_CLINIC_OR_DEPARTMENT_OTHER): Admitting: Family Medicine

## 2023-12-24 ENCOUNTER — Other Ambulatory Visit (INDEPENDENT_AMBULATORY_CARE_PROVIDER_SITE_OTHER): Payer: Self-pay | Admitting: Adult Health

## 2023-12-24 ENCOUNTER — Other Ambulatory Visit (INDEPENDENT_AMBULATORY_CARE_PROVIDER_SITE_OTHER): Payer: Self-pay | Admitting: Family Medicine

## 2023-12-24 DIAGNOSIS — E559 Vitamin D deficiency, unspecified: Secondary | ICD-10-CM

## 2023-12-24 DIAGNOSIS — E538 Deficiency of other specified B group vitamins: Secondary | ICD-10-CM

## 2023-12-24 DIAGNOSIS — R7303 Prediabetes: Secondary | ICD-10-CM

## 2023-12-29 ENCOUNTER — Ambulatory Visit (INDEPENDENT_AMBULATORY_CARE_PROVIDER_SITE_OTHER): Payer: Self-pay | Admitting: Family Medicine

## 2024-01-05 ENCOUNTER — Encounter (INDEPENDENT_AMBULATORY_CARE_PROVIDER_SITE_OTHER): Payer: Self-pay

## 2024-02-17 ENCOUNTER — Ambulatory Visit: Payer: 59 | Admitting: Neurology

## 2024-02-20 ENCOUNTER — Ambulatory Visit (HOSPITAL_BASED_OUTPATIENT_CLINIC_OR_DEPARTMENT_OTHER): Admitting: Family Medicine

## 2024-06-09 ENCOUNTER — Ambulatory Visit (HOSPITAL_BASED_OUTPATIENT_CLINIC_OR_DEPARTMENT_OTHER): Admitting: Family Medicine

## 2024-06-23 ENCOUNTER — Ambulatory Visit (INDEPENDENT_AMBULATORY_CARE_PROVIDER_SITE_OTHER): Admitting: Family Medicine

## 2024-06-23 ENCOUNTER — Encounter (HOSPITAL_BASED_OUTPATIENT_CLINIC_OR_DEPARTMENT_OTHER): Payer: Self-pay | Admitting: Family Medicine

## 2024-06-23 VITALS — BP 167/96 | HR 50 | Ht 72.0 in | Wt 389.9 lb

## 2024-06-23 DIAGNOSIS — R0681 Apnea, not elsewhere classified: Secondary | ICD-10-CM | POA: Diagnosis not present

## 2024-06-23 DIAGNOSIS — R7303 Prediabetes: Secondary | ICD-10-CM

## 2024-06-23 DIAGNOSIS — I10 Essential (primary) hypertension: Secondary | ICD-10-CM | POA: Diagnosis not present

## 2024-06-23 DIAGNOSIS — E039 Hypothyroidism, unspecified: Secondary | ICD-10-CM

## 2024-06-23 MED ORDER — AMLODIPINE BESYLATE 5 MG PO TABS: 5.0000 mg | ORAL_TABLET | Freq: Every day | ORAL | 1 refills | Status: DC

## 2024-06-23 MED ORDER — LEVOTHYROXINE SODIUM 88 MCG PO TABS: 88.0000 ug | ORAL_TABLET | Freq: Every day | ORAL | 1 refills | Status: DC

## 2024-06-23 MED ORDER — CHLORTHALIDONE 25 MG PO TABS: 25.0000 mg | ORAL_TABLET | Freq: Every day | ORAL | 1 refills | Status: DC

## 2024-06-23 NOTE — Assessment & Plan Note (Signed)
 Patient does have some concern about underlying sleep apnea.  He reports that he has been told that he has appeared to stop breathing at times while sleeping.  He does have some ongoing fatigue as well as continued weight gain, blood pressure issues, underlying prediabetes. I do feel that having further evaluation of potential underlying sleep apnea would be beneficial, patient in agreement, referral placed today

## 2024-06-23 NOTE — Patient Instructions (Signed)
   Medication Instructions:  Your physician recommends that you continue on your current medications as directed. Please refer to the Current Medication list given to you today. --If you need a refill on any your medications before your next appointment, please call your pharmacy first. If no refills are authorized on file call the office.-- Lab Work: Your physician has recommended that you have lab work today: today If you have labs (blood work) drawn today and your tests are completely normal, you will receive your results via MyChart message OR a phone call from our staff.  Please ensure you check your voicemail in the event that you authorized detailed messages to be left on a delegated number. If you have any lab test that is abnormal or we need to change your treatment, we will call you to review the results.    Follow-Up: Your next appointment:   Your physician recommends that you schedule a follow-up appointment in: 2 month follow up  with Dr. de [place]  You will receive a text message or e-mail with a link to a survey about your care and experience with [place] today! We would greatly appreciate your feedback!   Thanks for letting us be apart of your health journey!!  Primary Care and Sports Medicine   Dr. Ceasar Mons [place]   We encourage you to activate your patient portal called "MyChart".  Sign up information is provided on this After Visit Summary.  MyChart is used to connect with patients for Virtual Visits (Telemedicine).  Patients are able to view lab/test results, encounter notes, upcoming appointments, etc.  Non-urgent messages can be sent to your provider as well. To learn more about what you can do with MyChart, please visit --  [email or web address].

## 2024-06-23 NOTE — Progress Notes (Signed)
 Procedures performed today:    None.  Independent interpretation of notes and tests performed by another provider:   None.  Brief History, Exam, Impression, and Recommendations:    BP (!) 167/96 (BP Location: Right Arm, Patient Position: Sitting, Cuff Size: Large)   Pulse (!) 50   Ht 6' (1.829 m)   Wt (!) 389 lb 14.4 oz (176.9 kg)   SpO2 96%   BMI 52.88 kg/m   Patient last seen over 9 months ago.  At that time, was recommended to follow-up in 3 months.  Unfortunately, he has had several appointments scheduled, all of which have been canceled by patient.  He indicates today that he does have various complaints and symptoms.  He also reports that he is not currently taking any of his medications which have been prescribed previously. Has not had recent follow-up with any of his specialists - indicates that he was getting insurance through his wife but she lost her job. He has insurance now, plans to contact speciality offices to scheduled follow-up appointments.  Hypothyroidism, unspecified type Assessment & Plan: Patient has been without levothyroxine  for several months.  We will proceed with labs today to assess current TSH.  Likely will proceed with resuming levothyroxine  at prior dose with plan for recheck of TSH after about 6 weeks. If TSH was found to be within normal range, then would hold off on resuming levothyroxine  and can instead monitor TSH moving forward.  Orders: -     Levothyroxine  Sodium; Take 1 tablet (88 mcg total) by mouth daily.  Dispense: 90 tablet; Refill: 1 -     TSH  Essential hypertension Assessment & Plan: Blood pressure elevated in office today.  This likely is related to patient being without antihypertensives.  At this time, will have patient resume antihypertensives, however we will start with low-dose amlodipine  to try to limit risk of potential side effects. Recommend intermittent monitoring of blood pressure home, DASH diet.  Will plan for  follow-up in about 2 months to assess progress with medication, or sooner as needed.  Orders: -     amLODIPine  Besylate; Take 1 tablet (5 mg total) by mouth daily.  Dispense: 90 tablet; Refill: 1 -     Chlorthalidone ; Take 1 tablet (25 mg total) by mouth daily.  Dispense: 90 tablet; Refill: 1 -     Basic metabolic panel with GFR  Witnessed episode of apnea Assessment & Plan: Patient does have some concern about underlying sleep apnea.  He reports that he has been told that he has appeared to stop breathing at times while sleeping.  He does have some ongoing fatigue as well as continued weight gain, blood pressure issues, underlying prediabetes. I do feel that having further evaluation of potential underlying sleep apnea would be beneficial, patient in agreement, referral placed today  Orders: -     Ambulatory referral to Neurology  Prediabetes Assessment & Plan: Due for recheck of A1c.  Was also being prescribed metformin  through [facility].  He has not had recent follow-up with them due to insurance issues.  It does appear that they also look to start GLP-1 receptor agonist, however as with other meds patient is not currently taking this. Will check A1c today.  Recommend that he also reach out to [facility] to schedule follow-up appointment, he indicates he will do so.  Orders: -     Hemoglobin A1c  Return in about 2 months (around 08/23/2024) for  hypertension, prediabetes, hypothyroidism.   ___________________________________________ Marcel Gary de [place], MD, ABFM, CAQSM Primary Care and Sports Medicine [facility]

## 2024-06-23 NOTE — Assessment & Plan Note (Signed)
 Due for recheck of A1c.  Was also being prescribed metformin  through [facility].  He has not had recent follow-up with them due to insurance issues.  It does appear that they also look to start GLP-1 receptor agonist, however as with other meds patient is not currently taking this. Will check A1c today.  Recommend that he also reach out to [facility] to schedule follow-up appointment, he indicates he will do so.

## 2024-06-23 NOTE — Assessment & Plan Note (Signed)
 Patient has been without levothyroxine  for several months.  We will proceed with labs today to assess current TSH.  Likely will proceed with resuming levothyroxine  at prior dose with plan for recheck of TSH after about 6 weeks. If TSH was found to be within normal range, then would hold off on resuming levothyroxine  and can instead monitor TSH moving forward.

## 2024-06-23 NOTE — Assessment & Plan Note (Addendum)
 Blood pressure elevated in office today.  This likely is related to patient being without antihypertensives.  At this time, will have patient resume antihypertensives, however we will start with low-dose amlodipine  to try to limit risk of potential side effects. Recommend intermittent monitoring of blood pressure home, DASH diet.  Will plan for follow-up in about 2 months to assess progress with medication, or sooner as needed.

## 2024-06-24 LAB — BASIC METABOLIC PANEL WITH GFR
BUN/Creatinine Ratio: 6 — ABNORMAL LOW (ref 9–20)
BUN: 8 mg/dL (ref 6–20)
CO2: 23 mmol/L (ref 20–29)
Calcium: 9.4 mg/dL (ref 8.7–10.2)
Chloride: 104 mmol/L (ref 96–106)
Creatinine, Ser: 1.42 mg/dL — ABNORMAL HIGH (ref 0.76–1.27)
Glucose: 80 mg/dL (ref 70–99)
Potassium: 4 mmol/L (ref 3.5–5.2)
Sodium: 144 mmol/L (ref 134–144)
eGFR: 66 mL/min/1.73 (ref >59)

## 2024-06-24 LAB — TSH: TSH: 4.82 u[IU]/mL — ABNORMAL HIGH (ref 0.450–4.500)

## 2024-06-24 LAB — HEMOGLOBIN A1C
Est. average glucose Bld gHb Est-mCnc: 123 mg/dL
Hgb A1c MFr Bld: 5.9 % — ABNORMAL HIGH (ref 4.8–5.6)

## 2024-06-25 ENCOUNTER — Ambulatory Visit (HOSPITAL_BASED_OUTPATIENT_CLINIC_OR_DEPARTMENT_OTHER): Payer: Self-pay | Admitting: Family Medicine

## 2024-06-25 DIAGNOSIS — E039 Hypothyroidism, unspecified: Secondary | ICD-10-CM

## 2024-07-12 ENCOUNTER — Ambulatory Visit (HOSPITAL_BASED_OUTPATIENT_CLINIC_OR_DEPARTMENT_OTHER): Admitting: Family Medicine

## 2024-07-13 ENCOUNTER — Ambulatory Visit (HOSPITAL_BASED_OUTPATIENT_CLINIC_OR_DEPARTMENT_OTHER): Admitting: Family Medicine

## 2024-07-20 ENCOUNTER — Ambulatory Visit (INDEPENDENT_AMBULATORY_CARE_PROVIDER_SITE_OTHER): Admitting: Neurology

## 2024-07-20 ENCOUNTER — Encounter: Payer: Self-pay | Admitting: Neurology

## 2024-07-20 VITALS — BP 133/82 | HR 70 | Ht 72.0 in | Wt 389.6 lb

## 2024-07-20 DIAGNOSIS — G4719 Other hypersomnia: Secondary | ICD-10-CM | POA: Diagnosis not present

## 2024-07-20 DIAGNOSIS — R351 Nocturia: Secondary | ICD-10-CM

## 2024-07-20 DIAGNOSIS — Z6841 Body Mass Index (BMI) 40.0 and over, adult: Secondary | ICD-10-CM

## 2024-07-20 DIAGNOSIS — R0681 Apnea, not elsewhere classified: Secondary | ICD-10-CM

## 2024-07-20 DIAGNOSIS — Z9189 Other specified personal risk factors, not elsewhere classified: Secondary | ICD-10-CM

## 2024-07-20 DIAGNOSIS — R0683 Snoring: Secondary | ICD-10-CM | POA: Diagnosis not present

## 2024-07-20 NOTE — Progress Notes (Signed)
 Subjective:    Patient ID: David Tanner is a 38 y.o. male.  HPI    True Mar, MD, PhD [facility] [address] [address] [place], [place] [address]  Dear Dr. de [place],   I saw your patient, David Tanner, upon your kind request in my sleep clinic today for initial consultation of his sleep disorder, in particular, concern for underlying obstructive sleep apnea.  The patient is unaccompanied today.  As you know, David Tanner is a 38 year old male with an underlying medical history of hypothyroidism, hypertension, prediabetes, history of DVT, hyperlipidemia, vitamin D  deficiency, and morbid obesity with a BMI of over 50, who reports snoring and excessive daytime somnolence.  His Epworth sleepiness score is 7 out of 24, fatigue severity score is 9 out of 63.  I reviewed your office note from 06/23/2024.  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  He has been noted to have pauses in his breathing while asleep per wife's report.  He lives with his family including wife and 3 children, ages 58, 23 and 68.  He quit smoking cigarettes.  He smokes THC about twice a week.  He does not drink any alcohol.  He drinks 1 bottle of soda per day, 16.9 ounce size.  He has nocturia about once or twice per average night, denies recurrent nocturnal or morning headaches.  He drives a box truck.  His sleep-related symptoms have been ongoing for at least 2 years in his estimation.  He is working on weight loss.  Weight has been fluctuating.  He is not aware of any family history of sleep apnea.  He is no longer on a blood thinner, reports that he was supposed to take a baby aspirin but typically does not take it.  He is encouraged to start the baby aspirin.   His Past Medical History Is Significant For: Past Medical History:  Diagnosis Date   DVT (deep venous thrombosis) (HCC)    Left leg   Hyperlipidemia    Hypothyroid    Vitamin D  deficiency      His Past Surgical History Is Significant For: History reviewed. No pertinent surgical history.  His Family History Is Significant For: Family History  Problem Relation Age of Onset   Hypertension Mother    Diabetes Father    Cancer Maternal Grandfather        leukemia   Sleep apnea Neg Hx     His Social History Is Significant For: Social History   Socioeconomic History   Marital status: Single    Spouse name: Barista   Number of children: Not on file   Years of education: Not on file   Highest education level: Not on file  Occupational History   Occupation: Delivery Driver  Tobacco Use   Smoking status: Former    Current packs/day: 0.25    Average packs/day: 0.3 packs/day for 1 year (0.3 ttl pk-yrs)    Types: Cigarettes    Passive exposure: Past   Smokeless tobacco: Never   Tobacco comments:    Patient states he quit smoking about 2 years ago (approx 2020)  Vaping Use   Vaping status: Never Used  Substance and Sexual Activity   Alcohol use: No    Alcohol/week: 0.0 standard drinks of alcohol   Drug use: Yes    Types: Marijuana   Sexual activity: Yes    Comment: With girlfriend for 4 years  Other Topics Concern   Not on file  Social History Narrative   Lives with wife and 3 kids   Right Handed   Drinks 6-7 cups caffeine    Pt works    Social Drivers of Corporate investment banker Strain: Not on file  Food Insecurity: No Food Insecurity (10/10/2021)   Hunger Vital Sign    Worried About Running Out of Food in the Last Year: Never true    Ran Out of Food in the Last Year: Never true  Transportation Needs: Not on file  Physical Activity: Not on file  Stress: Not on file  Social Connections: Not on file    His Allergies Are:  No Known Allergies:   His Current Medications Are:  Outpatient Encounter Medications as of 07/20/2024  Medication Sig   amLODipine  (NORVASC ) 5 MG tablet Take 1 tablet (5 mg total) by mouth daily.   chlorthalidone  (HYGROTON ) 25  MG tablet Take 1 tablet (25 mg total) by mouth daily.   levothyroxine  (SYNTHROID ) 88 MCG tablet Take 1 tablet (88 mcg total) by mouth daily.   aspirin EC 81 MG tablet Take 81 mg by mouth 2 (two) times daily. (Patient not taking: Reported on 06/23/2024)   cyanocobalamin  (VITAMIN B12) 1000 MCG/ML injection Inject 1 mL (1,000 mcg total) into the muscle every 14 (fourteen) days. (Patient not taking: Reported on 06/23/2024)   metFORMIN  (GLUCOPHAGE -XR) 500 MG 24 hr tablet Take 1 tablet (500 mg total) by mouth 2 (two) times daily with a meal. (Patient not taking: Reported on 06/23/2024)   tirzepatide  (ZEPBOUND ) 2.5 MG/0.5ML Pen Inject 2.5 mg into the skin once a week. (Patient not taking: Reported on 06/23/2024)   topiramate  (TOPAMAX ) 100 MG tablet Take 1 tablet (100 mg total) by mouth 2 (two) times daily. (Patient not taking: Reported on 06/23/2024)   Vitamin D , Ergocalciferol , (DRISDOL ) 1.25 MG (50000 UNIT) CAPS capsule Take 1 capsule (50,000 Units total) by mouth 2 (two) times a week. (Patient not taking: Reported on 06/23/2024)   No facility-administered encounter medications on file as of 07/20/2024.  :   Review of Systems:  Out of a complete 14 point review of systems, all are reviewed and negative with the exception of these symptoms as listed below:   Review of Systems  Neurological:        Pt here for sleep consult Pt snores,hypertension. Pt denies sleep study,cpap machine,headaches,fatigue    ESS:7 FSS:9    Objective:  Neurological Exam  Physical Exam Physical Examination:   Vitals:   07/20/24 1428  BP: 133/82  Pulse: 70    General Examination: The patient is a very pleasant 38 y.o. male in no acute distress. He appears well-developed and well-nourished and well groomed.   HEENT: Normocephalic, atraumatic, pupils are equal, round and reactive to light, extraocular tracking is good without limitation to gaze excursion or nystagmus noted. No photophobia. No corrective eye glasses  in place. Hearing is grossly intact.  Face is symmetric with normal facial animation. Speech is clear without dysarthria. There is no hypophonia. There is no lip, neck/head, jaw or voice tremor. Neck is supple with full range of passive and active motion. There are no carotid bruits on auscultation.  Airway/Oropharynx exam reveals: mild mouth dryness, good dental hygiene and moderate airway crowding, due to wider tongue, longer uvula, tonsillar size of about 1-2+ bilaterally, Mallampati class II, neck circumference 19 and three-quarter inches, mild to moderate overbite noted.  Tongue protrudes centrally and palate elevates symmetrically.  Chest: Clear to auscultation without wheezing, rhonchi or crackles noted.  Heart: S1+S2+0, regular and normal without murmurs, rubs or gallops noted.   Abdomen: Soft, non-tender and non-distended.  Extremities: There is trace edema in the right distal lower extremity, trace to 1+ edema in the left distal lower extremity which is larger in caliber with swelling around the ankle noted as well compared to right, left leg had the DVT.   Skin: Warm and dry without trophic changes noted.   Musculoskeletal: exam reveals no obvious joint deformities.   Neurologically:  Mental status: The patient is awake, alert and oriented in all 4 spheres. His immediate and remote memory, attention, language skills and fund of knowledge are appropriate. There is no evidence of aphasia, agnosia, apraxia or anomia. Speech is clear with normal prosody and enunciation. Thought process is linear. Mood is normal and affect is normal.  Cranial nerves II - XII are as described above under HEENT exam.  Motor exam: Normal bulk, strength and tone is noted. There is no obvious action or resting tremor.  Fine motor skills and coordination: Intact grossly.  Cerebellar testing: No dysmetria or intention tremor. There is no truncal or gait ataxia.  Sensory exam: intact to light touch in the upper  and lower extremities.  Gait, station and balance: He stands easily. No veering to one side is noted. No leaning to one side is noted. Posture is age-appropriate and stance is narrow based. Gait shows normal stride length and normal pace. No problems turning are noted.   Assessment and Plan:  In summary, David Tanner is a very pleasant 38 year old male with an underlying medical history of hypothyroidism, hypertension, prediabetes, history of DVT, hyperlipidemia, vitamin D  deficiency, and morbid obesity with a BMI of over 50, whose history and physical exam are concerning for sleep disordered breathing, particularly obstructive sleep apnea (OSA). A laboratory attended sleep study is typically considered gold standard for evaluation of sleep disordered breathing.   I had a long chat with the patient about my findings and the diagnosis of sleep apnea, particularly OSA, its prognosis and treatment options. We talked about medical/conservative treatments, surgical interventions and non-pharmacological approaches for symptom control. I explained, in particular, the risks and ramifications of untreated moderate to severe OSA, especially with respect to developing cardiovascular disease down the road, including congestive heart failure (CHF), difficult to treat hypertension, cardiac arrhythmias (particularly A-fib), neurovascular complications including TIA, stroke and dementia. Even type 2 diabetes has, in part, been linked to untreated OSA. Symptoms of untreated OSA may include (but may not be limited to) daytime sleepiness, nocturia (i.e. frequent nighttime urination), memory problems, mood irritability and suboptimally controlled or worsening mood disorder such as depression and/or anxiety, lack of energy, lack of motivation, physical discomfort, as well as recurrent headaches, especially morning or nocturnal headaches. We talked about the importance of maintaining a healthy lifestyle and striving for healthy  weight.  The importance of complete THC smoking cessation was also addressed.  In addition, we talked about the importance of striving for and maintaining good sleep hygiene. I recommended a sleep study at this time. I outlined the differences between a laboratory attended sleep study which is considered more comprehensive and accurate over the option of a home sleep test (HST); the latter may lead to underestimation of sleep disordered breathing in some instances and does not help with diagnosing upper airway resistance syndrome and is not accurate enough to diagnose primary central sleep apnea typically. I outlined possible surgical  and non-surgical treatment options of OSA, including the use of a positive airway pressure (PAP) device (i.e. CPAP, AutoPAP/APAP or BiPAP in certain circumstances), a custom-made dental device (aka oral appliance, which would require a referral to a specialist dentist or orthodontist typically, and is generally speaking not considered for patients with full dentures or edentulous state), upper airway surgical options, such as traditional UPPP (which is not considered a first-line treatment) or the Inspire device (hypoglossal nerve stimulator, which would involve a referral for consultation with an ENT surgeon, after careful selection, following inclusion criteria - also not first-line treatment). I explained the PAP treatment option to the patient in detail, as this is generally considered first-line treatment.  The patient indicated that he would be willing to try PAP therapy, if the need arises. I explained the importance of being compliant with PAP treatment, not only for insurance purposes but primarily to improve patient's symptoms symptoms, and for the patient's long term health benefit, including to reduce His cardiovascular risks longer-term.    We will pick up our discussion about the next steps and treatment options after testing.  We will keep him posted as to the test  results by phone call and/or MyChart messaging where possible.  We will plan to follow-up in sleep clinic accordingly as well.  I answered all his questions today and the patient  was in agreement.   I encouraged him to call with any interim questions, concerns, problems or updates or email us  through MyChart.  Generally speaking, sleep test authorizations may take up to 2 weeks, sometimes less, sometimes longer, the patient is encouraged to get in touch with us  if they do not hear back from the sleep lab staff directly within the next 2 weeks.  Thank you very much for allowing me to participate in the care of this nice patient. If I can be of any further assistance to you please do not hesitate to call me at [phone].  Sincerely,   True Mar, MD, PhD

## 2024-07-20 NOTE — Patient Instructions (Signed)

## 2024-07-22 ENCOUNTER — Ambulatory Visit (HOSPITAL_BASED_OUTPATIENT_CLINIC_OR_DEPARTMENT_OTHER): Admitting: Family Medicine

## 2024-08-20 ENCOUNTER — Ambulatory Visit

## 2024-08-20 DIAGNOSIS — R0681 Apnea, not elsewhere classified: Secondary | ICD-10-CM

## 2024-08-20 DIAGNOSIS — Z9189 Other specified personal risk factors, not elsewhere classified: Secondary | ICD-10-CM

## 2024-08-20 DIAGNOSIS — R351 Nocturia: Secondary | ICD-10-CM

## 2024-08-20 DIAGNOSIS — R0683 Snoring: Secondary | ICD-10-CM

## 2024-08-20 DIAGNOSIS — G4719 Other hypersomnia: Secondary | ICD-10-CM

## 2024-09-01 ENCOUNTER — Encounter (HOSPITAL_BASED_OUTPATIENT_CLINIC_OR_DEPARTMENT_OTHER): Payer: Self-pay | Admitting: Family Medicine

## 2024-09-01 ENCOUNTER — Ambulatory Visit (INDEPENDENT_AMBULATORY_CARE_PROVIDER_SITE_OTHER): Admitting: Family Medicine

## 2024-09-01 DIAGNOSIS — E039 Hypothyroidism, unspecified: Secondary | ICD-10-CM

## 2024-09-01 DIAGNOSIS — I10 Essential (primary) hypertension: Secondary | ICD-10-CM | POA: Diagnosis not present

## 2024-09-01 MED ORDER — LEVOTHYROXINE SODIUM 88 MCG PO TABS: 88.0000 ug | ORAL_TABLET | Freq: Every day | ORAL | 1 refills | Status: DC

## 2024-09-01 MED ORDER — CHLORTHALIDONE 25 MG PO TABS: 25.0000 mg | ORAL_TABLET | Freq: Every day | ORAL | 1 refills | Status: AC

## 2024-09-01 MED ORDER — AMLODIPINE BESYLATE 5 MG PO TABS: 5.0000 mg | ORAL_TABLET | Freq: Every day | ORAL | 1 refills | Status: AC

## 2024-09-01 NOTE — Assessment & Plan Note (Signed)
 Blood pressure is improved today, still slightly above goal.  Has not been checking blood pressures at home.  Has been taking medications as prescribed.  No current symptoms with chest pain or headaches. Discussed considerations, can continue with current dose of medications, no changes made today.  He is in the process of having sleep apnea evaluation.  Ultimately, if diagnosed with sleep apnea, treatment of this could also help with controlling blood pressure.  Also recommend focusing on lifestyle modifications, DASH diet. We will monitor blood pressure closely in the office

## 2024-09-01 NOTE — Assessment & Plan Note (Signed)
 TSH was slightly elevated on recent labs, he did resume levothyroxine , has been taking as prescribed.  Due for recheck of TSH to assess response to current dose of levothyroxine , we will check this today.

## 2024-09-01 NOTE — Progress Notes (Signed)
    Procedures performed today:    None.  Independent interpretation of notes and tests performed by another provider:   None.  Brief History, Exam, Impression, and Recommendations:    BP (!) 135/91 (BP Location: Right Arm, Patient Position: Sitting, Cuff Size: Large)   Pulse 70   Temp 98.2 F (36.8 C) (Oral)   Resp 20   Ht 6' (1.829 m)   Wt (!) 388 lb (176 kg)   SpO2 98%   BMI 52.62 kg/m   Hypothyroidism, unspecified type Assessment & Plan: TSH was slightly elevated on recent labs, he did resume levothyroxine , has been taking as prescribed.  Due for recheck of TSH to assess response to current dose of levothyroxine , we will check this today.  Orders: -     TSH -     Levothyroxine  Sodium; Take 1 tablet (88 mcg total) by mouth daily.  Dispense: 90 tablet; Refill: 1  Essential hypertension Assessment & Plan: Blood pressure is improved today, still slightly above goal.  Has not been checking blood pressures at home.  Has been taking medications as prescribed.  No current symptoms with chest pain or headaches. Discussed considerations, can continue with current dose of medications, no changes made today.  He is in the process of having sleep apnea evaluation.  Ultimately, if diagnosed with sleep apnea, treatment of this could also help with controlling blood pressure.  Also recommend focusing on lifestyle modifications, DASH diet. We will monitor blood pressure closely in the office  Orders: -     Basic metabolic panel with GFR -     Chlorthalidone ; Take 1 tablet (25 mg total) by mouth daily.  Dispense: 90 tablet; Refill: 1 -     amLODIPine  Besylate; Take 1 tablet (5 mg total) by mouth daily.  Dispense: 90 tablet; Refill: 1  Return in about 3 months (around 11/30/2024) for CPE with fasting labs 1 week prior.   ___________________________________________ Alec Mcphee de [place], MD, ABFM, CAQSM Primary Care and Sports Medicine [facility]

## 2024-09-02 LAB — BASIC METABOLIC PANEL WITH GFR
BUN/Creatinine Ratio: 8 — ABNORMAL LOW (ref 9–20)
BUN: 11 mg/dL (ref 6–20)
CO2: 23 mmol/L (ref 20–29)
Calcium: 10 mg/dL (ref 8.7–10.2)
Chloride: 100 mmol/L (ref 96–106)
Creatinine, Ser: 1.39 mg/dL — ABNORMAL HIGH (ref 0.76–1.27)
Glucose: 87 mg/dL (ref 70–99)
Potassium: 3.8 mmol/L (ref 3.5–5.2)
Sodium: 141 mmol/L (ref 134–144)
eGFR: 67 mL/min/1.73 (ref >59)

## 2024-09-02 LAB — TSH: TSH: 4.79 u[IU]/mL — ABNORMAL HIGH (ref 0.450–4.500)

## 2024-09-09 ENCOUNTER — Ambulatory Visit (HOSPITAL_BASED_OUTPATIENT_CLINIC_OR_DEPARTMENT_OTHER): Payer: Self-pay | Admitting: Family Medicine

## 2024-09-09 DIAGNOSIS — E039 Hypothyroidism, unspecified: Secondary | ICD-10-CM

## 2024-09-09 MED ORDER — LEVOTHYROXINE SODIUM 100 MCG PO TABS: 100.0000 ug | ORAL_TABLET | Freq: Every day | ORAL | 1 refills | Status: AC

## 2024-09-09 MED ORDER — LEVOTHYROXINE SODIUM 100 MCG PO TABS: 100.0000 ug | ORAL_TABLET | Freq: Every day | ORAL | 1 refills | Status: DC

## 2024-10-21 ENCOUNTER — Ambulatory Visit (HOSPITAL_BASED_OUTPATIENT_CLINIC_OR_DEPARTMENT_OTHER)

## 2024-12-20 ENCOUNTER — Ambulatory Visit (HOSPITAL_BASED_OUTPATIENT_CLINIC_OR_DEPARTMENT_OTHER)

## 2024-12-28 ENCOUNTER — Encounter (HOSPITAL_BASED_OUTPATIENT_CLINIC_OR_DEPARTMENT_OTHER): Admitting: Family Medicine
